# Patient Record
Sex: Female | Born: 1975 | Race: White | Hispanic: No | Marital: Married | Smoking: Current every day smoker
Health system: Southern US, Community
[De-identification: ages and names within clinical notes are randomized; demographics above are authoritative.]

## PROBLEM LIST (undated history)

## (undated) DIAGNOSIS — F1190 Opioid use, unspecified, uncomplicated: Secondary | ICD-10-CM

## (undated) DIAGNOSIS — F988 Other specified behavioral and emotional disorders with onset usually occurring in childhood and adolescence: Secondary | ICD-10-CM

## (undated) DIAGNOSIS — N200 Calculus of kidney: Secondary | ICD-10-CM

## (undated) DIAGNOSIS — K649 Unspecified hemorrhoids: Secondary | ICD-10-CM

## (undated) DIAGNOSIS — F319 Bipolar disorder, unspecified: Secondary | ICD-10-CM

## (undated) HISTORY — DX: Calculus of kidney: N20.0

## (undated) HISTORY — DX: Other specified behavioral and emotional disorders with onset usually occurring in childhood and adolescence: F98.8

## (undated) HISTORY — PX: LITHOTRIPSY: SUR834

## (undated) HISTORY — PX: THERAPEUTIC ABORTION: SHX798

## (undated) HISTORY — DX: Unspecified hemorrhoids: K64.9

---

## 1998-08-18 ENCOUNTER — Inpatient Hospital Stay (HOSPITAL_COMMUNITY): Admission: AD | Admit: 1998-08-18 | Discharge: 1998-08-18 | Payer: Self-pay | Admitting: *Deleted

## 1998-08-18 ENCOUNTER — Encounter: Payer: Self-pay | Admitting: *Deleted

## 1998-08-24 ENCOUNTER — Inpatient Hospital Stay (HOSPITAL_COMMUNITY): Admission: AD | Admit: 1998-08-24 | Discharge: 1998-08-24 | Payer: Self-pay | Admitting: Obstetrics & Gynecology

## 1999-02-09 ENCOUNTER — Emergency Department (HOSPITAL_COMMUNITY): Admission: EM | Admit: 1999-02-09 | Discharge: 1999-02-09 | Payer: Self-pay | Admitting: Emergency Medicine

## 1999-12-24 ENCOUNTER — Emergency Department (HOSPITAL_COMMUNITY): Admission: EM | Admit: 1999-12-24 | Discharge: 1999-12-24 | Payer: Self-pay

## 2000-02-14 ENCOUNTER — Emergency Department (HOSPITAL_COMMUNITY): Admission: EM | Admit: 2000-02-14 | Discharge: 2000-02-14 | Payer: Self-pay | Admitting: Emergency Medicine

## 2000-05-23 ENCOUNTER — Inpatient Hospital Stay (HOSPITAL_COMMUNITY): Admission: AD | Admit: 2000-05-23 | Discharge: 2000-05-23 | Payer: Self-pay | Admitting: Obstetrics

## 2000-06-11 ENCOUNTER — Other Ambulatory Visit: Admission: RE | Admit: 2000-06-11 | Discharge: 2000-06-11 | Payer: Self-pay | Admitting: Obstetrics and Gynecology

## 2000-09-14 ENCOUNTER — Inpatient Hospital Stay (HOSPITAL_COMMUNITY): Admission: AD | Admit: 2000-09-14 | Discharge: 2000-09-14 | Payer: Self-pay | Admitting: Obstetrics & Gynecology

## 2000-10-21 ENCOUNTER — Ambulatory Visit (HOSPITAL_COMMUNITY): Admission: RE | Admit: 2000-10-21 | Discharge: 2000-10-21 | Payer: Self-pay | Admitting: Obstetrics & Gynecology

## 2001-01-28 ENCOUNTER — Inpatient Hospital Stay (HOSPITAL_COMMUNITY): Admission: AD | Admit: 2001-01-28 | Discharge: 2001-02-01 | Payer: Self-pay | Admitting: Obstetrics and Gynecology

## 2001-01-28 ENCOUNTER — Encounter (INDEPENDENT_AMBULATORY_CARE_PROVIDER_SITE_OTHER): Payer: Self-pay

## 2001-04-06 ENCOUNTER — Encounter: Payer: Self-pay | Admitting: Obstetrics and Gynecology

## 2001-04-06 ENCOUNTER — Ambulatory Visit (HOSPITAL_COMMUNITY): Admission: RE | Admit: 2001-04-06 | Discharge: 2001-04-06 | Payer: Self-pay | Admitting: Obstetrics and Gynecology

## 2001-12-02 ENCOUNTER — Other Ambulatory Visit: Admission: RE | Admit: 2001-12-02 | Discharge: 2001-12-02 | Payer: Self-pay | Admitting: Obstetrics and Gynecology

## 2002-05-02 ENCOUNTER — Ambulatory Visit (HOSPITAL_COMMUNITY): Admission: AD | Admit: 2002-05-02 | Discharge: 2002-05-02 | Payer: Self-pay | Admitting: Obstetrics and Gynecology

## 2002-07-21 ENCOUNTER — Inpatient Hospital Stay (HOSPITAL_COMMUNITY): Admission: AD | Admit: 2002-07-21 | Discharge: 2002-07-24 | Payer: Self-pay | Admitting: Obstetrics and Gynecology

## 2002-09-03 ENCOUNTER — Encounter: Payer: Self-pay | Admitting: Emergency Medicine

## 2002-09-03 ENCOUNTER — Emergency Department (HOSPITAL_COMMUNITY): Admission: EM | Admit: 2002-09-03 | Discharge: 2002-09-03 | Payer: Self-pay | Admitting: Emergency Medicine

## 2002-09-05 ENCOUNTER — Ambulatory Visit (HOSPITAL_COMMUNITY): Admission: AD | Admit: 2002-09-05 | Discharge: 2002-09-05 | Payer: Self-pay | Admitting: Urology

## 2002-12-08 ENCOUNTER — Ambulatory Visit (HOSPITAL_BASED_OUTPATIENT_CLINIC_OR_DEPARTMENT_OTHER): Admission: RE | Admit: 2002-12-08 | Discharge: 2002-12-08 | Payer: Self-pay | Admitting: Urology

## 2002-12-14 ENCOUNTER — Other Ambulatory Visit: Admission: RE | Admit: 2002-12-14 | Discharge: 2002-12-14 | Payer: Self-pay | Admitting: Obstetrics and Gynecology

## 2004-01-12 ENCOUNTER — Other Ambulatory Visit: Admission: RE | Admit: 2004-01-12 | Discharge: 2004-01-12 | Payer: Self-pay | Admitting: Obstetrics & Gynecology

## 2004-07-17 ENCOUNTER — Ambulatory Visit: Payer: Self-pay | Admitting: Family Medicine

## 2004-08-27 ENCOUNTER — Ambulatory Visit: Payer: Self-pay | Admitting: Family Medicine

## 2004-09-20 ENCOUNTER — Ambulatory Visit: Payer: Self-pay | Admitting: Internal Medicine

## 2004-12-19 ENCOUNTER — Ambulatory Visit: Payer: Self-pay | Admitting: Family Medicine

## 2005-01-13 ENCOUNTER — Other Ambulatory Visit: Admission: RE | Admit: 2005-01-13 | Discharge: 2005-01-13 | Payer: Self-pay | Admitting: Obstetrics and Gynecology

## 2005-01-30 ENCOUNTER — Ambulatory Visit: Payer: Self-pay | Admitting: Internal Medicine

## 2005-06-05 ENCOUNTER — Ambulatory Visit: Payer: Self-pay | Admitting: Family Medicine

## 2007-04-09 ENCOUNTER — Inpatient Hospital Stay (HOSPITAL_COMMUNITY): Admission: AD | Admit: 2007-04-09 | Discharge: 2007-04-09 | Payer: Self-pay | Admitting: Gynecology

## 2007-08-25 ENCOUNTER — Ambulatory Visit (HOSPITAL_COMMUNITY): Admission: RE | Admit: 2007-08-25 | Discharge: 2007-08-25 | Payer: Self-pay | Admitting: Obstetrics and Gynecology

## 2007-11-16 ENCOUNTER — Inpatient Hospital Stay (HOSPITAL_COMMUNITY): Admission: RE | Admit: 2007-11-16 | Discharge: 2007-11-19 | Payer: Self-pay | Admitting: Obstetrics and Gynecology

## 2008-01-22 ENCOUNTER — Inpatient Hospital Stay (HOSPITAL_COMMUNITY): Admission: AD | Admit: 2008-01-22 | Discharge: 2008-01-22 | Payer: Self-pay | Admitting: Obstetrics and Gynecology

## 2008-05-05 ENCOUNTER — Emergency Department (HOSPITAL_COMMUNITY): Admission: EM | Admit: 2008-05-05 | Discharge: 2008-05-05 | Payer: Self-pay | Admitting: Emergency Medicine

## 2010-04-19 ENCOUNTER — Emergency Department (HOSPITAL_COMMUNITY): Admission: EM | Admit: 2010-04-19 | Discharge: 2010-04-20 | Payer: Self-pay | Admitting: Emergency Medicine

## 2010-06-04 ENCOUNTER — Emergency Department (HOSPITAL_COMMUNITY)
Admission: EM | Admit: 2010-06-04 | Discharge: 2010-06-05 | Payer: Self-pay | Source: Home / Self Care | Admitting: Emergency Medicine

## 2010-06-14 ENCOUNTER — Emergency Department (HOSPITAL_COMMUNITY)
Admission: EM | Admit: 2010-06-14 | Discharge: 2010-06-15 | Payer: Self-pay | Source: Home / Self Care | Admitting: Emergency Medicine

## 2010-06-28 ENCOUNTER — Emergency Department (HOSPITAL_COMMUNITY)
Admission: EM | Admit: 2010-06-28 | Discharge: 2010-06-28 | Payer: Self-pay | Source: Home / Self Care | Admitting: Emergency Medicine

## 2010-09-17 LAB — BASIC METABOLIC PANEL
BUN: 7 mg/dL (ref 6–23)
CO2: 23 mEq/L (ref 19–32)
Calcium: 8.8 mg/dL (ref 8.4–10.5)
Chloride: 110 mEq/L (ref 96–112)
Creatinine, Ser: 0.79 mg/dL (ref 0.4–1.2)
GFR calc Af Amer: >60 mL/min (ref >60)
GFR calc non Af Amer: >60 mL/min (ref >60)
Glucose, Bld: 107 mg/dL — ABNORMAL HIGH (ref 70–99)
Potassium: 3.3 mEq/L — ABNORMAL LOW (ref 3.5–5.1)
Sodium: 139 mEq/L (ref 135–145)

## 2010-09-17 LAB — DIFFERENTIAL
Basophils Absolute: 0 10*3/uL (ref 0.0–0.1)
Basophils Relative: 0 % (ref 0–1)
Eosinophils Absolute: 0.3 10*3/uL (ref 0.0–0.7)
Eosinophils Relative: 5 % (ref 0–5)
Lymphocytes Relative: 26 % (ref 12–46)
Lymphs Abs: 1.6 10*3/uL (ref 0.7–4.0)
Monocytes Absolute: 0.5 10*3/uL (ref 0.1–1.0)
Monocytes Relative: 8 % (ref 3–12)
Neutro Abs: 3.8 10*3/uL (ref 1.7–7.7)
Neutrophils Relative %: 61 % (ref 43–77)

## 2010-09-17 LAB — WET PREP, GENITAL
Trich, Wet Prep: NONE SEEN
Yeast Wet Prep HPF POC: NONE SEEN

## 2010-09-17 LAB — COMPREHENSIVE METABOLIC PANEL
ALT: 28 U/L (ref 0–35)
AST: 22 U/L (ref 0–37)
Albumin: 4.3 g/dL (ref 3.5–5.2)
Alkaline Phosphatase: 68 U/L (ref 39–117)
BUN: 9 mg/dL (ref 6–23)
CO2: 28 mEq/L (ref 19–32)
Calcium: 10.4 mg/dL (ref 8.4–10.5)
Chloride: 102 mEq/L (ref 96–112)
Creatinine, Ser: 0.83 mg/dL (ref 0.4–1.2)
GFR calc Af Amer: >60 mL/min (ref >60)
GFR calc non Af Amer: >60 mL/min (ref >60)
Glucose, Bld: 93 mg/dL (ref 70–99)
Potassium: 3.7 mEq/L (ref 3.5–5.1)
Sodium: 138 mEq/L (ref 135–145)
Total Bilirubin: 0.5 mg/dL (ref 0.3–1.2)
Total Protein: 7.6 g/dL (ref 6.0–8.3)

## 2010-09-17 LAB — URINALYSIS, ROUTINE W REFLEX MICROSCOPIC
Bilirubin Urine: NEGATIVE
Bilirubin Urine: NEGATIVE
Glucose, UA: NEGATIVE mg/dL
Glucose, UA: NEGATIVE mg/dL
Hgb urine dipstick: NEGATIVE
Ketones, ur: 15 mg/dL — AB
Ketones, ur: NEGATIVE mg/dL
Leukocytes, UA: NEGATIVE
Nitrite: NEGATIVE
Nitrite: POSITIVE — AB
Protein, ur: NEGATIVE mg/dL
Protein, ur: NEGATIVE mg/dL
Specific Gravity, Urine: 1.019 (ref 1.005–1.030)
Specific Gravity, Urine: 1.031 — ABNORMAL HIGH (ref 1.005–1.030)
Urobilinogen, UA: 1 mg/dL (ref 0.0–1.0)
Urobilinogen, UA: 1 mg/dL (ref 0.0–1.0)
pH: 6 (ref 5.0–8.0)
pH: 7 (ref 5.0–8.0)

## 2010-09-17 LAB — CBC
HCT: 40.5 % (ref 36.0–46.0)
HCT: 43.2 % (ref 36.0–46.0)
Hemoglobin: 13.8 g/dL (ref 12.0–15.0)
Hemoglobin: 14.8 g/dL (ref 12.0–15.0)
MCH: 32.5 pg (ref 26.0–34.0)
MCH: 32.9 pg (ref 26.0–34.0)
MCHC: 34.1 g/dL (ref 30.0–36.0)
MCHC: 34.3 g/dL (ref 30.0–36.0)
MCV: 95.5 fL (ref 78.0–100.0)
MCV: 96 fL (ref 78.0–100.0)
Platelets: 232 10*3/uL (ref 150–400)
Platelets: 265 10*3/uL (ref 150–400)
RBC: 4.24 MIL/uL (ref 3.87–5.11)
RBC: 4.5 MIL/uL (ref 3.87–5.11)
RDW: 12.6 % (ref 11.5–15.5)
RDW: 12.7 % (ref 11.5–15.5)
WBC: 6.2 10*3/uL (ref 4.0–10.5)
WBC: 8 10*3/uL (ref 4.0–10.5)

## 2010-09-17 LAB — URINE MICROSCOPIC-ADD ON

## 2010-09-17 LAB — PREGNANCY, URINE: Preg Test, Ur: NEGATIVE

## 2010-09-17 LAB — GC/CHLAMYDIA PROBE AMP, GENITAL
Chlamydia, DNA Probe: NEGATIVE
GC Probe Amp, Genital: NEGATIVE

## 2010-09-17 LAB — POCT PREGNANCY, URINE: Preg Test, Ur: NEGATIVE

## 2010-09-18 LAB — DIFFERENTIAL
Basophils Absolute: 0.1 10*3/uL (ref 0.0–0.1)
Basophils Relative: 1 % (ref 0–1)
Eosinophils Absolute: 0.1 10*3/uL (ref 0.0–0.7)
Eosinophils Relative: 1 % (ref 0–5)
Lymphocytes Relative: 22 % (ref 12–46)
Lymphs Abs: 1.6 10*3/uL (ref 0.7–4.0)
Monocytes Absolute: 0.6 10*3/uL (ref 0.1–1.0)
Monocytes Relative: 8 % (ref 3–12)
Neutro Abs: 4.8 10*3/uL (ref 1.7–7.7)
Neutrophils Relative %: 68 % (ref 43–77)

## 2010-09-18 LAB — BASIC METABOLIC PANEL
BUN: 12 mg/dL (ref 6–23)
CO2: 23 mEq/L (ref 19–32)
Calcium: 9.5 mg/dL (ref 8.4–10.5)
Chloride: 108 mEq/L (ref 96–112)
Creatinine, Ser: 0.75 mg/dL (ref 0.4–1.2)
GFR calc Af Amer: >60 mL/min (ref >60)
GFR calc non Af Amer: >60 mL/min (ref >60)
Glucose, Bld: 118 mg/dL — ABNORMAL HIGH (ref 70–99)
Potassium: 3.4 mEq/L — ABNORMAL LOW (ref 3.5–5.1)
Sodium: 138 mEq/L (ref 135–145)

## 2010-09-18 LAB — RAPID URINE DRUG SCREEN, HOSP PERFORMED
Amphetamines: POSITIVE — AB
Barbiturates: NOT DETECTED
Benzodiazepines: POSITIVE — AB
Cocaine: POSITIVE — AB
Opiates: POSITIVE — AB
Tetrahydrocannabinol: POSITIVE — AB

## 2010-09-18 LAB — CBC
HCT: 41.8 % (ref 36.0–46.0)
Hemoglobin: 14.5 g/dL (ref 12.0–15.0)
MCH: 33.7 pg (ref 26.0–34.0)
MCHC: 34.7 g/dL (ref 30.0–36.0)
MCV: 97 fL (ref 78.0–100.0)
Platelets: 241 10*3/uL (ref 150–400)
RBC: 4.3 MIL/uL (ref 3.87–5.11)
RDW: 13.1 % (ref 11.5–15.5)
WBC: 7.1 10*3/uL (ref 4.0–10.5)

## 2010-09-18 LAB — HEPATIC FUNCTION PANEL
ALT: 15 U/L (ref 0–35)
AST: 15 U/L (ref 0–37)
Albumin: 4.3 g/dL (ref 3.5–5.2)
Alkaline Phosphatase: 58 U/L (ref 39–117)
Bilirubin, Direct: 0.1 mg/dL (ref 0.0–0.3)
Indirect Bilirubin: 0.4 mg/dL (ref 0.3–0.9)
Total Bilirubin: 0.5 mg/dL (ref 0.3–1.2)
Total Protein: 7.4 g/dL (ref 6.0–8.3)

## 2010-09-18 LAB — ETHANOL: Alcohol, Ethyl (B): <5 mg/dL (ref 0–10)

## 2010-09-18 LAB — POCT PREGNANCY, URINE: Preg Test, Ur: NEGATIVE

## 2010-09-18 LAB — TRICYCLICS SCREEN, URINE: TCA Scrn: NOT DETECTED

## 2010-09-18 LAB — ACETAMINOPHEN LEVEL: Acetaminophen (Tylenol), Serum: <10 ug/mL — ABNORMAL LOW (ref 10–30)

## 2010-11-19 NOTE — Op Note (Signed)
NAMEMERARI, PION             ACCOUNT NO.:  [id number]   MEDICAL RECORD NO.:  [id number]          PATIENT TYPE:  INP   LOCATION:  9111                          FACILITY:  [facility]   PHYSICIAN:  Randye Lobo, M.D.   DATE OF BIRTH:  [birth year]   DATE OF PROCEDURE:  11/16/2007  DATE OF DISCHARGE:                               OPERATIVE REPORT   PREOPERATIVE DIAGNOSES:  1. Intrauterine gestation at 79 plus 1 weeks.  2. History of prior cesarean section x2, desires repeat cesarean      section.   POSTOPERATIVE DIAGNOSES:  1. Intrauterine gestation at 75 plus 1 weeks.  2. History of prior cesarean section x2, desires repeat cesarean      section.   PROCEDURE:  Repeat low-segment transverse cesarean section.   SURGEON:  Randye Lobo, MD   ASSISTANT:  Gretchen Short, PA-C   ANESTHESIA:  Spinal.   IV FLUIDS:  2400 mL Ringer's lactate.   ESTIMATED BLOOD LOSS:  500 mL.   URINE OUTPUT:  100 mL.   COMPLICATIONS:  None.   INDICATIONS FOR PROCEDURE:  The patient is a 35 year old, para 2,  Caucasian female at 67 plus 1 weeks' gestation, who has a history of  prior cesarean section x2, and who desires a repeat cesarean section.  The patient wishes to proceed after risks, benefits, and alternatives  are reviewed.   FINDINGS:  A viable female was delivered at 11:06 a.m. with Apgars of 9 at  one minute and 9 at five minutes.  The weight was 7 pounds 11 ounces.  The amniotic fluid was clear.  The placenta had a normal insertion of a  three-vessel cord.  There were minimal right para tubal adhesions, which  were lysed with monopolar cautery.  The ovaries, left fallopian tube,  and uterus were unremarkable.   SPECIMENS:  None.   PROCEDURE:  The patient was re-identified in the preoperative hold area.  She received clindamycin 900 mg IV for antibiotic prophylaxis.  In the  operating room, a spinal anesthetic was administered and the patient was  then placed in the supine position.  The  abdomen was sterilely prepped,  and a Foley catheter placed inside the bladder.  She was sterilely  draped.   A Pfannenstiel incision was created sharply with a scalpel and the  incision was carried down to the fascia using the scalpel.  Hemostasis  was created with monopolar cautery.  The fascia was incised in the  midline and the fascial incision was extended bilaterally with the Mayo  scissors.  The rectus muscles were separated from the fascia superiorly  and inferiorly using sharp dissection.  The parietal peritoneum was  elevated with two hemostat clamps and was entered sharply.  The  peritoneal incision was extended cranially and caudally.   The bladder retractor was placed over the bladder and the lower uterine  segment was exposed.  A bladder flap was then sharply created.  A  transverse lower uterine segment incision was created sharply with a  scalpel and the incision was extended bilaterally in an upward fashion  using a bandage  scissors.   A hand was inserted through the uterine incision and the vertex was  delivered without difficulty followed by the remainder of the infant.  The nares and mouth were suctioned.  The umbilical cord was doubly  clamped and cut.  The newborn was carried over to the awaiting  pediatricians in vigorous condition.   The placenta was then manually extracted and was set aside for cord  blood donation and cord blood collection.   The uterus was exteriorized at this time and it was wiped clean with a  moistened lap pad.  The uterine incision was then closed with a double  layer closure of #1 chromic.  The first with a running locked layer and  the second with an imbricating layer.  There was a small amount of  oozing along the right mid portion of the incision and this responded to  a figure-of-eight suture of #1 chromic.  There was some oozing along the  peritoneal edges of the visceral peritoneum of the uterus, and this  responded to  monopolar cautery.   The uterus was returned to the peritoneal cavity, which was irrigated  and suctioned.  The uterine incision was hemostatic, and the abdomen was  therefore closed.  The parietal peritoneum was closed with a running  suture of 3-0 Vicryl.  The rectus muscles were reapproximated using  interrupted sutures of #1 chromic.  The fascial incision was closed with  a running suture of 0 Vicryl.  The subcutaneous layer was irrigated and  suctioned and made hemostatic with monopolar cautery.  There were some  small oozing vessels in the subcutaneous layer that did respond to  pressure and monopolar cautery.  A decision was made to close this layer  also with interrupted sutures of #2 plain gut suture.  Hemostasis was  good as the skin was closed with staples.  A sterile pressure bandage  was placed over this.   This concluded the patient's procedure.  There were no complications.  All needle, instrument, and sponge counts were correct.  The patient is  escorted to the recovery room in stable and awake condition.      Randye Lobo, M.D.  Electronically Signed     BES/MEDQ  D:  11/16/2007  T:  11/17/2007  Job:  [id number]

## 2010-11-22 NOTE — Discharge Summary (Signed)
[facility]  Patient:    PETINA, MURASKI Visit Number: [id number] MRN: [id number]          Service Type: OBS Location: 910A 9108 [id number] Attending Physician:  Melony Overly Dictated by:   Leilani Able, P.A. Admit Date:  01/28/2001 Discharge Date: 02/01/2001                             Discharge Summary  FINAL DIAGNOSES:              Intrauterine pregnancy at 41 3/[redacted] weeks gestation, failure to progress, chorioamnionitis, uterine atone.  PROCEDURE:                    Primary low segment transverse cesarean section.  SURGEON:                      Brook A. Edward Jolly, M.D.  COMPLICATIONS:                None.  HISTORY:                      This 35 year old G3, P0-0-2-0 presents at 18 2/[redacted] weeks gestation in early labor.  Patients cervix is about 4 cm dilated, 80% effaced, about a -2 station on admission.  AROM was performed.  Pitocin augmentation was begun and IUPCs and fetal scalp electrode were placed. Patient dilated to complete and complete.  As patient started pushing she did develop temperatures of about 100.3 and chorioamnionitis was diagnosed.  She was treated with clindamycin 900 g IV and Tylenol.  She pushed for over two hours and no descent of the vertex below a 0 station.  The heart rate tracing increased to about 170-175 baseline with accelerations up to about 200.  At this time a discussion was held with the patient regarding the failure to progress and the chorioamnionitis.  A decision was made to proceed with a cesarean section.  Patient was taken to the operating room by Dr. Conley Simmonds where a primary low transverse cesarean section was performed with the delivery of a 9 pound 4 ounce female infant with Apgars of 8 and 9.  The delivery went without complications.  Uterine atone was noted and therefore after delivery the patient received Pitocin 40 units IV and Methergine 0.2 mg IM which produced good uterine tone.  Patients  postoperative course was benign without significant fevers.  She just had mild lochia and her uterus continued to be firm.  She was felt ready for discharge on postoperative day #3.  Was sent home on a regular diet.  Told to decrease activities.  Told to continue prenatal vitamins.  Was given a prescription for Tylox #25 one to two q.4h. p.r.n. for pain.  Told she could use over-the-counter pain medications. Follow-up in the office in four weeks. Dictated by:   Leilani Able, P.A. Attending Physician:  Melony Overly DD:  03/15/01 TD:  03/15/01 Job: [id number]

## 2010-11-22 NOTE — Op Note (Signed)
NAME:  Misty Young, Misty Young                       ACCOUNT NO.:  [id number]   MEDICAL RECORD NO.:  [id number]                   PATIENT TYPE:  AMB   LOCATION:  DAY                                  FACILITY:  [facility]   PHYSICIAN:  Maretta Bees. Vonita Moss, M.D.             DATE OF BIRTH:  10/23/1975   DATE OF PROCEDURE:  09/05/2002  DATE OF DISCHARGE:                                 OPERATIVE REPORT   PREOPERATIVE DIAGNOSIS:  Left ureteral calculus.   POSTOPERATIVE DIAGNOSIS:  Left ureteral calculus.   PROCEDURES:  1. Cystoscopy.  2. Left retrograde pyelography.  3. Left ureteroscopy with laser lithotripsy.  4. Left ureteral stent placement.   SURGEON:  Maretta Bees. Vonita Moss, M.D.   ASSISTANT:  Crecencio Mc, M.D.   ANESTHESIA:  General.   COMPLICATIONS:  None.   INDICATIONS:  The patient is a 35 year old white female who is approximately  6 weeks post partum. She began having  left-sided flank pain and a CT scan  was performed which demonstrated a 5-mm distal left ureteral calculus. The  patient had been in significant pain, and after discussing the options, the  patient elected to proceed with ureteroscopic removal with possible need for  laser lithotripsy. The potential risks and benefits of the procedure were  explained to the patient and she consented.   DESCRIPTION OF PROCEDURE:  The patient was taken to the operating room and a  general anesthetic was administered. The patient was given preoperative  antibiotics, placed in the dorsal lithotomy position and prepped and draped  in the usual sterile fashion.   Next cystoscopy was performed with the 12 degree lens. The ureteral orifices  were noted to be in the normal anatomic position and unremarkable otherwise.  The bladder demonstrated no evidence of any bladder tumors, stones, or other  new mucosal pathology.   Attention was then turned to the left ureter. An attempt was made to pass a  0.38 guide wire past the stone. However,  resistance was met and therefore it  was removed and a 0.038 glide wire was passed by the stone and into the  renal pelvis under fluoroscopic guidance.   A 6 French end-hole catheter was then passed up over this wire into the  renal pelvis and the glide wire was exchanged for the guide wire.  Ureteroscopy was then attempted next to the guide wire. However, the ureter  was of insufficient caliber to allow ureteroscopy. Therefore, the  ureteroscope was removed and the wire was back loaded over the wire and  balloon dilation was performed of the distal ureter.   Again ureteroscopy was attempted and the distal ureter was able to be  intubated. However, the area just above the area of dilation and below the  stone still could not be maneuvered to allow passage of the ureteroscope up  to the stone.   Therefore an attempt was made to redilate the area just above  this narrowed  portion. However, the balloon dilator could not be passed up by this  narrowed point. Therefore, the ureteral dilators were used to gently dilate  up this narrowed area.   The ureteroscope was then placed back into the bladder and the left ureter  was able to  be intubated and the stone was able to  be visualized. On  visual inspection, the stone appeared to be in 2 halves with a connecting  isthmus. It was decided that laser lithotripsy would be necessary.   Therefore the homium laser was passed up through the ureteroscope  and the  stone was broken in half. A Nitinol basket was then used to grasp the  remaining fragments which were able to be extracted from the ureter.  Reinspection of the ureter revealed no further fragments..   A multicoil stent was then placed up over the wire into the renal pelvis  under fluoroscopic guidance. The wire was then removed with a good curl  noted in the pelvis as well as in the bladder. A string was left on the  distal end of the stent.   The bladder was then emptied and the  procedure was ended. There were no  complications and the patient appeared to tolerate the procedure well. She  was able to be transferred to the recovery unit in satisfactory condition.  Please note that Dr. Maretta Bees. Vonita Moss was present and participated in this  entire procedure.     Crecencio Mc, M.D.                          Maretta Bees. Vonita Moss, M.D.    LB/MEDQ  D:  09/05/2002  T:  09/05/2002  Job:  [id number]

## 2010-11-22 NOTE — Discharge Summary (Signed)
Misty Young, Misty Young             ACCOUNT NO.:  [id number]   MEDICAL RECORD NO.:  [id number]          PATIENT TYPE:  INP   LOCATION:  9111                          FACILITY:  [facility]   PHYSICIAN:  Carrington Clamp, M.D. DATE OF BIRTH:  [birth year]   DATE OF ADMISSION:  11/16/2007  DATE OF DISCHARGE:  11/19/2007                               DISCHARGE SUMMARY   FINAL DIAGNOSES:  1. Intrauterine gestation at 39-1/[redacted] weeks gestation.  2. History of prior cesarean sections x2.  The patient desires repeat      cesarean section.   PROCEDURE:  Repeat low transverse cesarean section.   SURGEON:  Randye Lobo, MD   ASSISTANT:  Chinita Greenland, PA-C   COMPLICATIONS:  None.   This 35 year old G5, P 2-0-2-2 presents at term for a repeat cesarean  section.  The patient had two prior cesarean sections and desires repeat  with this pregnancy as well.  The patient's antepartum course up to this  point has been uncomplicated.  She did have a negative group B strep  culture obtained at about 35 weeks' gestation.  The rubella status is  equivocal.  She will need to get a vaccine postpartum.  The patient is  also Rh negative and did receive RhoGAM at 27 weeks.  The patient was  taken to the operating room on Nov 16, 2007, by Dr. Conley Simmonds where  repeat low segment transverse cesarean section was performed with the  delivery of a 7 pounds 11 ounces female infant with Apgars of 9 and 9.  There was some minimal right paratubal adhesions that were lysed.  The  delivery and procedure went without complication.  The patient's  postoperative course was benign without any significant fevers.  She was  felt ready for discharge on postoperative day #2.  She was sent home on  a regular diet, told to decrease activities, told to continue her  vitamins, was given Percocet 1 to 2 every 4-6 hours as needed for pain,  told she could also use Motrin up to 600 mg every 6 hours as needed for  pain.  She did want her  little boy circumcised prior to discharge and  did receive RhoGAM per protocol.   LABS ON DISCHARGE:  The patient had a hemoglobin of 11.4, white blood  cell count of 10.7 and platelets of 141,000.      Leilani Able, P.A.-C.      Carrington Clamp, M.D.  Electronically Signed   MB/MEDQ  D:  12/15/2007  T:  12/16/2007  Job:  [id number]

## 2010-11-22 NOTE — Op Note (Signed)
NAME:  Misty Young, Misty Young                       ACCOUNT NO.:  [id number]   MEDICAL RECORD NO.:  [id number]                   PATIENT TYPE:  INP   LOCATION:  9123                                 FACILITY:  [facility]   PHYSICIAN:  Randye Lobo, M.D.                DATE OF BIRTH:  07/08/1975   DATE OF PROCEDURE:  07/21/2002  DATE OF DISCHARGE:                                 OPERATIVE REPORT   PREOPERATIVE DIAGNOSES:  1. Intrauterine gestation at 38+5 weeks.  2. History of prior cesarean section, desire for repeat cesarean section.   POSTOPERATIVE DIAGNOSES:  1. Intrauterine gestation at 38+5 weeks.  2. History of prior cesarean section, desire for repeat cesarean section.   PROCEDURE:  Repeat low segment transverse cesarean section.   ANESTHESIA:  Spinal.   IV FLUIDS:  3000 cubic centimeters Ringer's lactate.   ESTIMATED BLOOD LOSS:  700 cubic centimeters.   URINE OUTPUT:  120 cubic centimeters.   COMPLICATIONS:  None.   INDICATIONS FOR PROCEDURE:  The patient is a 35 year old gravida 4, para 1-0-  2-1 Caucasian female at 38+[redacted] weeks gestation who throughout her pregnancy  had requested a repeat cesarean section.  A plan was made to proceed with  surgical delivery after the risks, benefits, and alternatives were discussed  with her.   FINDINGS:  A viable female was delivered at 9:39 a.m. with Apgars of 9 at  one minute and 9 at five minutes.  The weight was later noted to be 8 pounds  4 ounces.  The amniotic fluid was clear.  The uterus, tubes, and ovaries  were normal.   SPECIMENS:  None.   PROCEDURE:  With an IV in place the patient was taken to the operating room  after she was properly identified.  The patient received a spinal anesthetic  and was then placed in the supine position with a left lateral tilt.  The  patient's abdomen was sterilely prepped and a Foley catheter was placed  inside the bladder.  The patient was then sterilely draped.  A Pfannenstiel  incision  was created sharply along the line of the patient's previous  Pfannenstiel incision.  This was carried down to the fascia with a  combination of monopolar cautery and sharp dissection.  The fascia was then  scored in the midline with the monopolar cautery instrument and the incision  was extended bilaterally using the Mayo scissors.  The rectus fascia was  separated from the underlying rectus muscles using a combination of sharp  dissection and monopolar cautery.  The rectus muscles were then divided in  the midline sharply.  The parietoperitoneum was grasped with two hemostat  clamps and was entered sharply with the Metzenbaum scissors.  The incision  was then extended cranially and caudally.   The lower uterine segment was exposed after the bladder retractor was  placed.  There were some adhesions along the lower uterine segment  between  the bladder and the lower uterine segment and because of this a high lower  uterine segment transverse incision was created sharply with a scalpel.  The  incision was extended bluntly and the membranes were ruptured.  A hand was  then inserted through the uterine incision and the fetal vertex was  delivered without difficulty.  The nares and mouth were suctioned and the  remainder of the infant was delivered.  The cord was doubly clamped and cut  and the newborn was carried over to the awaiting pediatricians in vigorous  condition.  Cord blood was collected and the placenta was then manually  extracted.  The uterus was exteriorized for its closure.  A moistened lap  pad was used to remove any remaining membranes from within the uterine  cavity.  The uterine incision was closed with a running locked suture of  number 1 chromic.  Hemostasis was noted to be excellent.  The uterus was  returned to the peritoneal cavity which was suctioned of any remaining  blood.   The parietoperitoneum was closed at this time with a running suture of 3-0  plain.  The  rectus muscles were reapproximated with two figure-of-eight  sutures of number 1 chromic.  The fascia was next closed with a running  suture of 0 Vicryl.  The subcutaneous tissue was irrigated and suctioned and  made hemostatic with monopolar cautery.  The skin was closed with staples  and a sterile bandage was placed over the incision.   This concluded the patient's procedure.  There were no complications.  All  needle, instrument, and sponge counts were correct.   The patient was escorted to the recovery room in stable and awake condition.                                               Randye Lobo, M.D.    BES/MEDQ  D:  07/21/2002  T:  07/21/2002  Job:  [id number]

## 2010-11-22 NOTE — Op Note (Signed)
[facility]  Patient:    Misty Young, Misty Young                  MRN: [id number] Proc. Date: 01/29/01 Adm. Date:  [id number] Attending:  Conley Simmonds A                           Operative Report  PREOPERATIVE DIAGNOSES:       1. Intrauterine pregnancy at 41 plus [redacted] weeks                                  gestation.                               2. Failure to progress.                               3. Chorioamnionitis.  POSTOPERATIVE DIAGNOSES:      1. Intrauterine pregnancy at 41 plus [redacted] weeks                                  gestation.                               2. Failure to progress.                               3. Chorioamnionitis.                               4. Uterine atony.  OPERATION:                    Primary low segment transverse cesarean section.  SURGEON:                      Brook A. Edward Jolly, M.D.  ANESTHESIA:                   Epidural.  IV FLUIDS:                    2300 cc of Ringers lactate.  ESTIMATED BLOOD LOSS:         1000 cc.  URINE OUTPUT:                 500 cc.  COMPLICATIONS:                None.  INDICATIONS FOR PROCEDURE:    The patient is a 35 year old gravida 3, para 0-0-2-0, who presented at 33 plus [redacted] weeks gestation in early labor.  The patients cervix was noted to be 4 cm dilated, 80% effaced, and with the vertex at the -1 to -2 station.  Artificial rupture of membranes was performed at this time and the fluid was noted to be clear with blood tingeing.  The fetal heart rate tracing was reassuring at this time, and the patient was having contractions every three minutes.  The patient was admitted in labor. She did receive Stadol intravenously for pain medication.  The  patient then went on to receive an epidural.  The patient did require Pitocin augmentation of her labor, and an IUPC and a fetal scalp electrode were also placed for the assistance of monitoring.  The patient had adequate uterine contractions,  at which time, she began to change her cervix well.  The patient achieved complete cervical dilatation at approximately 3:15 a.m.  The vertex was at the 0 station with the caput at the +1 station.  At this time, the fetal scalp electrode was replaced, and the fetal heart rate tracing was noted to be reassuring.  The patient was noted to have a temperature to 100.3 degrees Fahrenheit as she began pushing, and chorioamnionitis was diagnosed.  She was treated with clindamycin 33 mg intravenously and Tylenol.  The patient went on to push for over two hours with no descent of the vertex below the 0 station between pushes.  The fetal heart tracing baseline increased to 170 to 175 with accelerations up to 200.  There were occasional late decelerations.  At this time, a discussion was held with the patient and her family regarding the failure to progress of her labor, and the chorioamnionitis.  The recommendation was made to proceed with a primary low segment transverse cesarean section.  The family and patient chose to proceed after the risks and benefits were reviewed with them.  All questions were answered.  FINDINGS:                     A viable female infant was delivered at 5:30 a.m. with Apgars of 8 at one minute and 9 at five minutes.  The infant was noted to ________.  The amniotic fluid was noted.  The newborn weight was 9 pounds and 4 ounces.  The placenta was manually extracted, and it was sent to pathology.  Uterine atony was appreciated.  After delivery, the patient received Pitocin 40 units intravenously and Methergine 0.2 mg IM which produced good uterine tone.  There was a subserosal hematoma which was superior to the uterine incision.  This was isolated with figure-of-eight sutures of #1 chromic which isolated the hematoma and made it non-expanding. the cord pH was noted to be 7.28.  The fallopian tubes and ovaries were normal.  SPECIMENS:                    Placenta was  sent to pathology.  DESCRIPTION OF PROCEDURE:     With an IV, epidural catheter, and Foley in place, the patient was taken to the operating room from her labor and delivery suite.  The patients epidural was dosed after she was placed in the supine position on the operating room table. 33  The patients abdomen was then sterilely prepped and draped.  After adequate anesthesia was achieved, a Pfannenstiel incision was created sharply with the scalpel.  This was carried down to the rectus fascia using the same.  The fascia was then scored in the midline.  With the scalpel, the incision was extended bilaterally using Mayo scissors.  The rectus muscles were dissected off of the underlying fascia using the Mayo scissors.  The parietal peritoneum was entered sharply with Metzenbaum scissors after it was grasped and elevated with two hemostat clamps.  The peritoneal incision was extended cranially an caudally with care taken to avoid cystotomy.  The bladder retractor was placed over the bladder, and the lower uterine segment was exposed.  A bladder flap was created sharply with the Metzenbaum scissors.  The bladder retractor was replaced over the bladder to expose the lower uterine segment, and a transverse incision was created sharply with the scalpel.  This was carried out bilaterally in an upward fashion using the bandaged scissors.  The hand was inserted through the uterine incision, and the vertex was delivered without difficulty.  The remainder of the infant was delivered, and the nares and mouth were suctioned.  The cord was doubly clamped and cut, and the newborn was carried over to the awaiting pediatricians.  The cord pH and cord blood was obtained at this time.  The placenta was manually extracted, and the uterus was exteriorized for its closure.  A moistened lap pad was used to remove any remaining membranes from within the uterine cavity, and there were no products of conception noted.   The uterine incision was closed with a running locked suture of #1 chromic.  The hematoma along the subserosal region just superior to the incision in the midline was  appreciated.  A figure-of-eight suture of #1 chromic was placed which made the hematoma non-expanding.  There was some additional bleeding along the midline portion of the incision.  This responded well to a figure-of-eight suture of #1 chromic.  The uterus was returned to the peritoneal cavity which was then copiously irrigated and suctioned with crystalloid solution.  The incision was reexamined, and edges of the myometrium in the midline were cauterized with monopolar cautery for excellent hemostasis.  The rectus muscles were next approximated in the midline by placing a figure-of-eight suture of #1 chromic. The rectus fascia was examined and noted to be free of hematomas.  It was therefore closed with a running suture of 0 Vicryl.  The subcutaneous tissue was irrigated and suctioned.  Small bleeding vessels were cauterized for excellent hemostasis.  The subcutaneous tissue was closed with interrupted sutures of 3-0 plain, and staples were placed on the skin.  A pressure dressing was placed over the skin incisions.  The uterus was expressed of any remaining clots.  The patient was then escorted to the recovery room in stable and awake condition.  There were no complications to the procedure.  All needle, instrument, and sponge counts were correct. DD:  01/29/01 TD:  01/29/01 Job: [id number]

## 2010-11-22 NOTE — Discharge Summary (Signed)
   NAMEJENNELL, Misty Young                       ACCOUNT NO.:  [id number]   MEDICAL RECORD NO.:  [id number]                   PATIENT TYPE:  INP   LOCATION:  9123                                 FACILITY:  [facility]   PHYSICIAN:  Gerrit Friends. Aldona Bar, M.D.                DATE OF BIRTH:  1975/10/08   DATE OF ADMISSION:  07/21/2002  DATE OF DISCHARGE:  07/24/2002                                 DISCHARGE SUMMARY   DISCHARGE DIAGNOSES:  1. Term pregnancy delivered, 8 pound 4 ounce female with Apgars 9/9.  2. Blood type O negative--RhoGAM given.  3. Previous cesarean section.   PROCEDURES:  Repeat low transverse cesarean section.   HOSPITAL SUMMARY:  This 35 year old gravida 4, para 1 who had a previous  cesarean section was admitted at term for delivery by repeat cesarean  section which was carried out on the day of admission with delivery of an 8  pound 4 ounce female with Apgars of 9/9.  Her postoperative course was  relatively uncomplicated.  Her discharge hemoglobin 9.4, white count 8800,  platelet count 175,000.  On the morning of January 18th, she was ambulating  well, tolerating a regular diet well, having normal bowel and bladder  function, was afebrile.  Her breast feeding was going well.  Her wound was  clean and dry and she was ready for discharge.  Accordingly, her staples  were removed and her wound was Steri-Striped with benzoin.  She was given  all appropriate instructions and understood all instructions well.   DISCHARGE MEDICATIONS:  1. Vitamins--one a day.  2. Ferrous sulfate 300 mg daily.  3. Motrin 600 mg every 6 hours as needed for pain.  4. Tylox one to two every 4-6 hours as needed for severe pain.   FOLLOW UP:  She will return to the office for followup in approximately 4  weeks' time or as needed.   CONDITION ON DISCHARGE:  Improved.                                               Gerrit Friends. Aldona Bar, M.D.    RMW/MEDQ  D:  07/24/2002  T:  07/24/2002  Job:  [id number]

## 2011-01-16 ENCOUNTER — Emergency Department (HOSPITAL_COMMUNITY)
Admission: EM | Admit: 2011-01-16 | Discharge: 2011-01-16 | Disposition: A | Discharge status: Home / Self Care | Payer: Medicaid Other | Attending: Emergency Medicine | Admitting: Emergency Medicine

## 2011-01-16 DIAGNOSIS — R112 Nausea with vomiting, unspecified: Secondary | ICD-10-CM | POA: Insufficient documentation

## 2011-01-16 DIAGNOSIS — O99891 Other specified diseases and conditions complicating pregnancy: Secondary | ICD-10-CM | POA: Insufficient documentation

## 2011-01-16 LAB — COMPREHENSIVE METABOLIC PANEL
ALT: 59 U/L — ABNORMAL HIGH (ref 0–35)
AST: 42 U/L — ABNORMAL HIGH (ref 0–37)
Albumin: 3.5 g/dL (ref 3.5–5.2)
Alkaline Phosphatase: 70 U/L (ref 39–117)
BUN: 5 mg/dL — ABNORMAL LOW (ref 6–23)
CO2: 24 mEq/L (ref 19–32)
Calcium: 8.8 mg/dL (ref 8.4–10.5)
Chloride: 104 mEq/L (ref 96–112)
Creatinine, Ser: 0.6 mg/dL (ref 0.50–1.10)
GFR calc Af Amer: >60 mL/min (ref >60)
GFR calc non Af Amer: >60 mL/min (ref >60)
Glucose, Bld: 105 mg/dL — ABNORMAL HIGH (ref 70–99)
Potassium: 3.7 mEq/L (ref 3.5–5.1)
Sodium: 136 mEq/L (ref 135–145)
Total Bilirubin: 0.2 mg/dL — ABNORMAL LOW (ref 0.3–1.2)
Total Protein: 7 g/dL (ref 6.0–8.3)

## 2011-01-16 LAB — DIFFERENTIAL
Basophils Absolute: 0 10*3/uL (ref 0.0–0.1)
Basophils Relative: 0 % (ref 0–1)
Eosinophils Absolute: 0.1 10*3/uL (ref 0.0–0.7)
Eosinophils Relative: 2 % (ref 0–5)
Lymphocytes Relative: 14 % (ref 12–46)
Lymphs Abs: 0.7 10*3/uL (ref 0.7–4.0)
Monocytes Absolute: 0.3 10*3/uL (ref 0.1–1.0)
Monocytes Relative: 6 % (ref 3–12)
Neutro Abs: 4.2 10*3/uL (ref 1.7–7.7)
Neutrophils Relative %: 77 % (ref 43–77)

## 2011-01-16 LAB — CBC
HCT: 35.6 % — ABNORMAL LOW (ref 36.0–46.0)
Hemoglobin: 12.3 g/dL (ref 12.0–15.0)
MCH: 31.7 pg (ref 26.0–34.0)
MCHC: 34.6 g/dL (ref 30.0–36.0)
MCV: 91.8 fL (ref 78.0–100.0)
Platelets: 201 10*3/uL (ref 150–400)
RBC: 3.88 MIL/uL (ref 3.87–5.11)
RDW: 13.6 % (ref 11.5–15.5)
WBC: 5.4 10*3/uL (ref 4.0–10.5)

## 2011-01-16 LAB — URINALYSIS, ROUTINE W REFLEX MICROSCOPIC
Bilirubin Urine: NEGATIVE
Glucose, UA: NEGATIVE mg/dL
Hgb urine dipstick: NEGATIVE
Ketones, ur: NEGATIVE mg/dL
Leukocytes, UA: NEGATIVE
Nitrite: NEGATIVE
Protein, ur: NEGATIVE mg/dL
Specific Gravity, Urine: 1.012 (ref 1.005–1.030)
Urobilinogen, UA: 1 mg/dL (ref 0.0–1.0)
pH: 8 (ref 5.0–8.0)

## 2011-01-17 LAB — HIV ANTIBODY (ROUTINE TESTING W REFLEX): HIV: NONREACTIVE

## 2011-01-17 LAB — ABO/RH: RH Type: NEGATIVE

## 2011-01-17 LAB — ANTIBODY SCREEN: Antibody Screen: NEGATIVE

## 2011-01-17 LAB — HEPATITIS B SURFACE ANTIGEN: Hepatitis B Surface Ag: NEGATIVE

## 2011-01-17 LAB — RUBELLA ANTIBODY, IGM: Rubella: UNDETERMINED

## 2011-01-17 LAB — RPR: RPR: NONREACTIVE

## 2011-01-22 LAB — CULTURE, BLOOD (ROUTINE X 2)
Culture  Setup Time: 201207121357
Culture: NO GROWTH

## 2011-01-27 ENCOUNTER — Encounter (INDEPENDENT_AMBULATORY_CARE_PROVIDER_SITE_OTHER): Payer: Self-pay | Admitting: General Surgery

## 2011-02-03 ENCOUNTER — Ambulatory Visit (INDEPENDENT_AMBULATORY_CARE_PROVIDER_SITE_OTHER): Payer: Self-pay | Admitting: General Surgery

## 2011-03-28 LAB — RH IMMUNE GLOBULIN WORKUP (NOT WOMEN'S HOSP)
ABO/RH(D): O NEG
Antibody Screen: NEGATIVE

## 2011-04-02 LAB — URINALYSIS, ROUTINE W REFLEX MICROSCOPIC
Bilirubin Urine: NEGATIVE
Glucose, UA: NEGATIVE
Ketones, ur: NEGATIVE
Leukocytes, UA: NEGATIVE
Nitrite: NEGATIVE
Protein, ur: NEGATIVE
Specific Gravity, Urine: 1.01
Urobilinogen, UA: 0.2
pH: 7.5

## 2011-04-02 LAB — KLEIHAUER-BETKE STAIN
# Vials RhIg: 2
Fetal Cells %: 0.3
Quantitation Fetal Hemoglobin: 15

## 2011-04-02 LAB — CBC
HCT: 32.9 — ABNORMAL LOW
HCT: 37.5
Hemoglobin: 11.4 — ABNORMAL LOW
Hemoglobin: 12.9
MCHC: 34.5
MCHC: 34.6
MCV: 100.9 — ABNORMAL HIGH
MCV: 99.5
Platelets: 141 — ABNORMAL LOW
Platelets: 178
RBC: 3.27 — ABNORMAL LOW
RBC: 3.77 — ABNORMAL LOW
RDW: 13.7
RDW: 13.9
WBC: 10.7 — ABNORMAL HIGH
WBC: 9.2

## 2011-04-02 LAB — URINE MICROSCOPIC-ADD ON

## 2011-04-02 LAB — RH IMMUNE GLOB WKUP(>/=20WKS)(NOT WOMEN'S HOSP): Fetal Screen: POSITIVE

## 2011-04-02 LAB — RPR: RPR Ser Ql: NONREACTIVE

## 2011-04-04 LAB — WET PREP, GENITAL
Clue Cells Wet Prep HPF POC: NONE SEEN
Trich, Wet Prep: NONE SEEN
Yeast Wet Prep HPF POC: NONE SEEN

## 2011-04-04 LAB — CBC
HCT: 38
Hemoglobin: 12.9
MCHC: 34
MCV: 97.1
Platelets: 267
RBC: 3.92
RDW: 13.9
WBC: 8.7

## 2011-04-04 LAB — GC/CHLAMYDIA PROBE AMP, GENITAL
Chlamydia, DNA Probe: NEGATIVE
GC Probe Amp, Genital: NEGATIVE

## 2011-04-08 LAB — COMPREHENSIVE METABOLIC PANEL
ALT: 120 — ABNORMAL HIGH
AST: 70 — ABNORMAL HIGH
Albumin: 3.7
Alkaline Phosphatase: 208 — ABNORMAL HIGH
BUN: 13
CO2: 24
Calcium: 9.4
Chloride: 106
Creatinine, Ser: 0.81
GFR calc Af Amer: >60
GFR calc non Af Amer: >60
Glucose, Bld: 89
Potassium: 3.6
Sodium: 138
Total Bilirubin: 0.6
Total Protein: 7.6

## 2011-04-08 LAB — DIFFERENTIAL
Basophils Absolute: 0
Basophils Relative: 0
Eosinophils Absolute: 0.1
Eosinophils Relative: 2
Lymphocytes Relative: 36
Lymphs Abs: 3
Monocytes Absolute: 0.6
Monocytes Relative: 7
Neutro Abs: 4.5
Neutrophils Relative %: 55

## 2011-04-08 LAB — CBC
HCT: 42.3
Hemoglobin: 14.5
MCHC: 34.2
MCV: 93.7
Platelets: 295
RBC: 4.51
RDW: 12.6
WBC: 8.2

## 2011-04-08 LAB — RAPID URINE DRUG SCREEN, HOSP PERFORMED
Amphetamines: NOT DETECTED
Barbiturates: NOT DETECTED
Benzodiazepines: POSITIVE — AB
Cocaine: NOT DETECTED
Opiates: POSITIVE — AB
Tetrahydrocannabinol: POSITIVE — AB

## 2011-04-08 LAB — PREGNANCY, URINE: Preg Test, Ur: NEGATIVE

## 2011-04-08 LAB — ETHANOL: Alcohol, Ethyl (B): <5

## 2011-04-17 LAB — RH IMMUNE GLOBULIN WORKUP (NOT WOMEN'S HOSP)
ABO/RH(D): O NEG
Antibody Screen: NEGATIVE

## 2011-05-10 ENCOUNTER — Encounter (HOSPITAL_COMMUNITY): Payer: Self-pay | Admitting: *Deleted

## 2011-05-10 ENCOUNTER — Inpatient Hospital Stay (HOSPITAL_COMMUNITY)
Admission: AD | Admit: 2011-05-10 | Discharge: 2011-05-10 | Disposition: A | Discharge status: Home / Self Care | Payer: Medicaid Other | Source: Ambulatory Visit | Attending: Obstetrics and Gynecology | Admitting: Obstetrics and Gynecology

## 2011-05-10 DIAGNOSIS — G44209 Tension-type headache, unspecified, not intractable: Secondary | ICD-10-CM

## 2011-05-10 DIAGNOSIS — O1202 Gestational edema, second trimester: Secondary | ICD-10-CM

## 2011-05-10 DIAGNOSIS — IMO0002 Reserved for concepts with insufficient information to code with codable children: Secondary | ICD-10-CM | POA: Insufficient documentation

## 2011-05-10 MED ORDER — ACETAMINOPHEN 500 MG PO TABS
1000.0000 mg | ORAL_TABLET | Freq: Once | ORAL | Status: AC
  Administered 2011-05-10: 1000 mg via ORAL
  Filled 2011-05-10: qty 2

## 2011-05-10 NOTE — Progress Notes (Signed)
Pt states, " My feet started swelling 3 days ago. I drank water and elevated them, but when I woke up they were worse. I've had headaches off and on today too."

## 2011-05-10 NOTE — ED Provider Notes (Signed)
35 y.o.[id number] @22  wks Chief Complaint  Patient presents with  . Foot Swelling    SUBJECTIVE  HPI:  Reports bilateral feet and lower extremity edema present for 3 days and worse with standing. Minimal relief with leg elevation. Woke with a frontal headache today which has waxed and waned. No lateralizing neurologic symptoms.Takes methadone in am. Drinks a lot of coke.  Past Medical History  Diagnosis Date  . Kidney stones    Past Surgical History  Procedure Date  . Cesarean section    History   Social History  . Marital Status: Legally Separated    Spouse Name: N/A    Number of Children: N/A  . Years of Education: N/A   Occupational History  . Not on file.   Social History Main Topics  . Smoking status: Current Everyday Smoker  . Smokeless tobacco: Not on file  . Alcohol Use: Yes  . Drug Use: Yes  . Sexually Active:    Other Topics Concern  . Not on file   Social History Narrative  . No narrative on file   No current facility-administered medications on file prior to encounter.   Current Outpatient Prescriptions on File Prior to Encounter  Medication Sig Dispense Refill  . methadone (DOLOPHINE) 10 MG tablet Take 10 mg by mouth every 8 (eight) hours.        . ondansetron (ZOFRAN) 8 MG tablet Take by mouth every 8 (eight) hours as needed.         Allergies  Allergen Reactions  . Penicillins Hives    ROS: Pertinent items in HPI  OBJECTIVE  BP 102/50  Pulse 59  Temp(Src) 98 F (36.7 C) (Oral)  Resp 18  Ht 5' 8.5" (1.74 m)  Wt 106.765 kg (235 lb 6 oz)  BMI 35.27 kg/m2  Doppler FHR 145   Physical Exam  Constitutional: She is oriented to person, place, and time and well-developed, well-nourished, and in no distress. No distress.  HENT:  Head: Normocephalic.  Eyes: Pupils are equal, round, and reactive to light.  Neck: Neck supple.  Abdominal: Soft. There is tenderness. There is no guarding.       Minimal RUQ tenderness. Murphy's neg.  Large  diastasis recti  Musculoskeletal: Normal range of motion. She exhibits edema. She exhibits no tenderness.       Bilat. 2+ pretibial pitting edema, 3+ ankle edema. Pulses full, equal.  Neurological: She is alert and oriented to person, place, and time.  Skin: Skin is warm and dry.  Psychiatric: Affect normal.     MAU Course: Tylenol 100mg  given with relief of headache  ASSESSMENT  Gestational edema Tension headache  PLAN  Leg elevation, avoid prolonged standing. Support hose. Diet discussed. Tylenol for headache.

## 2011-05-10 NOTE — ED Notes (Signed)
D. Poe CNM in to see pt 

## 2011-05-23 ENCOUNTER — Emergency Department (HOSPITAL_COMMUNITY)
Admission: EM | Admit: 2011-05-23 | Discharge: 2011-05-23 | Disposition: A | Discharge status: Home / Self Care | Payer: Medicaid Other | Attending: Emergency Medicine | Admitting: Emergency Medicine

## 2011-05-23 ENCOUNTER — Encounter (HOSPITAL_COMMUNITY): Payer: Self-pay

## 2011-05-23 DIAGNOSIS — F17200 Nicotine dependence, unspecified, uncomplicated: Secondary | ICD-10-CM | POA: Insufficient documentation

## 2011-05-23 DIAGNOSIS — O219 Vomiting of pregnancy, unspecified: Secondary | ICD-10-CM

## 2011-05-23 LAB — CBC
HCT: 33.5 % — ABNORMAL LOW (ref 36.0–46.0)
Hemoglobin: 12 g/dL (ref 12.0–15.0)
MCH: 33.3 pg (ref 26.0–34.0)
MCHC: 35.8 g/dL (ref 30.0–36.0)
MCV: 93.1 fL (ref 78.0–100.0)
Platelets: 205 10*3/uL (ref 150–400)
RBC: 3.6 MIL/uL — ABNORMAL LOW (ref 3.87–5.11)
RDW: 13.7 % (ref 11.5–15.5)
WBC: 12.8 10*3/uL — ABNORMAL HIGH (ref 4.0–10.5)

## 2011-05-23 LAB — URINALYSIS, ROUTINE W REFLEX MICROSCOPIC
Bilirubin Urine: NEGATIVE
Glucose, UA: NEGATIVE mg/dL
Hgb urine dipstick: NEGATIVE
Leukocytes, UA: NEGATIVE
Nitrite: NEGATIVE
Protein, ur: NEGATIVE mg/dL
Specific Gravity, Urine: 1.018 (ref 1.005–1.030)
Urobilinogen, UA: 1 mg/dL (ref 0.0–1.0)
pH: 7 (ref 5.0–8.0)

## 2011-05-23 LAB — COMPREHENSIVE METABOLIC PANEL
ALT: 11 U/L (ref 0–35)
AST: 14 U/L (ref 0–37)
Albumin: 3.1 g/dL — ABNORMAL LOW (ref 3.5–5.2)
Alkaline Phosphatase: 71 U/L (ref 39–117)
BUN: 8 mg/dL (ref 6–23)
CO2: 21 mEq/L (ref 19–32)
Calcium: 9 mg/dL (ref 8.4–10.5)
Chloride: 102 mEq/L (ref 96–112)
Creatinine, Ser: 0.53 mg/dL (ref 0.50–1.10)
GFR calc Af Amer: >90 mL/min (ref >90)
GFR calc non Af Amer: >90 mL/min (ref >90)
Glucose, Bld: 105 mg/dL — ABNORMAL HIGH (ref 70–99)
Potassium: 3.6 mEq/L (ref 3.5–5.1)
Sodium: 133 mEq/L — ABNORMAL LOW (ref 135–145)
Total Bilirubin: 0.1 mg/dL — ABNORMAL LOW (ref 0.3–1.2)
Total Protein: 6.6 g/dL (ref 6.0–8.3)

## 2011-05-23 LAB — DIFFERENTIAL
Basophils Absolute: 0 10*3/uL (ref 0.0–0.1)
Basophils Relative: 0 % (ref 0–1)
Eosinophils Absolute: 0 10*3/uL (ref 0.0–0.7)
Eosinophils Relative: 0 % (ref 0–5)
Lymphocytes Relative: 6 % — ABNORMAL LOW (ref 12–46)
Lymphs Abs: 0.7 10*3/uL (ref 0.7–4.0)
Monocytes Absolute: 0.2 10*3/uL (ref 0.1–1.0)
Monocytes Relative: 2 % — ABNORMAL LOW (ref 3–12)
Neutro Abs: 11.8 10*3/uL — ABNORMAL HIGH (ref 1.7–7.7)
Neutrophils Relative %: 92 % — ABNORMAL HIGH (ref 43–77)

## 2011-05-23 MED ORDER — METOCLOPRAMIDE HCL 5 MG/ML IJ SOLN
10.0000 mg | Freq: Once | INTRAMUSCULAR | Status: AC
  Administered 2011-05-23: 10 mg via INTRAVENOUS
  Filled 2011-05-23: qty 2

## 2011-05-23 MED ORDER — FAMOTIDINE IN NACL 20-0.9 MG/50ML-% IV SOLN
20.0000 mg | Freq: Once | INTRAVENOUS | Status: AC
Start: 2011-05-23 — End: 2011-05-23
  Administered 2011-05-23: 20 mg via INTRAVENOUS
  Filled 2011-05-23: qty 50

## 2011-05-23 MED ORDER — DEXTROSE 5 % AND 0.9 % NACL IV BOLUS
1000.0000 mL | Freq: Once | INTRAVENOUS | Status: AC
  Administered 2011-05-23: 1000 mL via INTRAVENOUS

## 2011-05-23 MED ORDER — ONDANSETRON HCL 4 MG/2ML IJ SOLN
INTRAMUSCULAR | Status: AC
  Filled 2011-05-23: qty 2

## 2011-05-23 MED ORDER — DEXTROSE-NACL 5-0.9 % IV SOLN
INTRAVENOUS | Status: DC
  Administered 2011-05-23: 09:00:00 via INTRAVENOUS

## 2011-05-23 MED ORDER — METOCLOPRAMIDE HCL 10 MG PO TABS: 10.0000 mg | ORAL_TABLET | Freq: Four times a day (QID) | ORAL | Status: DC

## 2011-05-23 NOTE — ED Provider Notes (Signed)
History     CSN: [id number] Arrival date & time: 05/23/2011  8:26 AM   First MD Initiated Contact with Patient 05/23/11 0845      Chief Complaint  Patient presents with  . Nausea  . Emesis    (Consider location/radiation/quality/duration/timing/severity/associated sxs/prior treatment) Patient is a 35 y.o. female presenting with vomiting. The history is provided by the patient.  Emesis    she is pregnant at [redacted] weeks, and has been having nausea and vomiting throughout her pregnancy. Vomiting has been worse over the last 10 days. There's been associated epigastric burning which is currently rated at 3/10, but has been as severe as 10 out of 10. She's not had any fever, chills, or sweats. There's been no diarrhea. She has been receiving prenatal care through [facility]. 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 She has been taking Zofran which has not been giving her relief. She is concerned that the baby is not getting adequate nutrition because she is not holding anything at all down. Symptoms are described as severe. Nothing has made it better, nothing has made it worse. She did receive a dose of Zofran intravenously in the ambulance coming in which has given her slight relief.  Past Medical History  Diagnosis Date  . Kidney stones     Past Surgical History  Procedure Date  . Cesarean section   . Therapeutic abortion   . No past surgeries   . Cesarean section     Family History  Problem Relation Age of Onset  . Diabetes Maternal Grandfather     History  Substance Use Topics  . Smoking status: Current Everyday Smoker -- 1.0 packs/day  . Smokeless tobacco: Not on file  . Alcohol Use: No    OB History    Grav Para Term Preterm Abortions TAB SAB Ect Mult Living   6 3 3  2 1 1   3       Review of Systems  Gastrointestinal: Positive for vomiting.  All other systems reviewed and are negative.    Allergies   Penicillins  Home Medications   Current Outpatient Rx  Name Route Sig Dispense Refill  . METHADONE HCL 10 MG/ML PO CONC Oral Take 120 mg by mouth daily.      Marland Kitchen ONDANSETRON HCL 8 MG PO TABS Oral Take by mouth every 8 (eight) hours as needed. For nausea    . PRENATAL PLUS 27-1 MG PO TABS Oral Take 1 tablet by mouth daily.        BP 125/61  Temp(Src) 98.5 F (36.9 C) (Oral)  Resp 22  Ht 5\' 8"  (1.727 m)  Wt 220 lb (99.791 kg)  BMI 33.45 kg/m2  SpO2 99%  Physical Exam  Nursing note and vitals reviewed.  35 year old female who is resting comfortably and does not appear to be in any acute distress. Vital signs are significant for a respiratory rate of 22. PERRLA, EOMI. Oropharynx shows slightly dry mucous membranes. Neck is nontender and supple without adenopathy or JVD. Back is nontender there is no CVA tenderness. Lungs are clear without rales, wheezes, or rhonchi. Heart has regular rate and rhythm with a 1-2/6 blowing systolic murmur heard along the left sternal border. Abdomen is gravid with a small umbilical hernia present. Peristalsis is markedly diminished. There is no abdominal tenderness. Fundal size is seen this appropriate for stage of gestation. Extremities have no cyanosis or edema. Neurologic: Mental status is normal, cranial nerves are intact,  there are no motor Center deficits. Psychiatric: No abnormalities of mood or affect.  ED Course  Procedures (including critical care time)   Labs Reviewed  CBC  DIFFERENTIAL  URINALYSIS, ROUTINE W REFLEX MICROSCOPIC  COMPREHENSIVE METABOLIC PANEL   No results found.   No diagnosis found.  Results for orders placed during the hospital encounter of 05/23/11  CBC      Component Value Range   WBC 12.8 (*) 4.0 - 10.5 (K/uL)   RBC 3.60 (*) 3.87 - 5.11 (MIL/uL)   Hemoglobin 12.0  12.0 - 15.0 (g/dL)   HCT 40.9 (*) 81.1 - 46.0 (%)   MCV 93.1  78.0 - 100.0 (fL)   MCH 33.3  26.0 - 34.0 (pg)   MCHC 35.8  30.0 - 36.0 (g/dL)   RDW  91.4  78.2 - 95.6 (%)   Platelets 205  150 - 400 (K/uL)  DIFFERENTIAL      Component Value Range   Neutrophils Relative 92 (*) 43 - 77 (%)   Neutro Abs 11.8 (*) 1.7 - 7.7 (K/uL)   Lymphocytes Relative 6 (*) 12 - 46 (%)   Lymphs Abs 0.7  0.7 - 4.0 (K/uL)   Monocytes Relative 2 (*) 3 - 12 (%)   Monocytes Absolute 0.2  0.1 - 1.0 (K/uL)   Eosinophils Relative 0  0 - 5 (%)   Eosinophils Absolute 0.0  0.0 - 0.7 (K/uL)   Basophils Relative 0  0 - 1 (%)   Basophils Absolute 0.0  0.0 - 0.1 (K/uL)  URINALYSIS, ROUTINE W REFLEX MICROSCOPIC      Component Value Range   Color, Urine AMBER (*) YELLOW    Appearance CLOUDY (*) CLEAR    Specific Gravity, Urine 1.018  1.005 - 1.030    pH 7.0  5.0 - 8.0    Glucose, UA NEGATIVE  NEGATIVE (mg/dL)   Hgb urine dipstick NEGATIVE  NEGATIVE    Bilirubin Urine NEGATIVE  NEGATIVE    Ketones, ur TRACE (*) NEGATIVE (mg/dL)   Protein, ur NEGATIVE  NEGATIVE (mg/dL)   Urobilinogen, UA 1.0  0.0 - 1.0 (mg/dL)   Nitrite NEGATIVE  NEGATIVE    Leukocytes, UA NEGATIVE  NEGATIVE   COMPREHENSIVE METABOLIC PANEL      Component Value Range   Sodium 133 (*) 135 - 145 (mEq/L)   Potassium 3.6  3.5 - 5.1 (mEq/L)   Chloride 102  96 - 112 (mEq/L)   CO2 21  19 - 32 (mEq/L)   Glucose, Bld 105 (*) 70 - 99 (mg/dL)   BUN 8  6 - 23 (mg/dL)   Creatinine, Ser 2.13  0.50 - 1.10 (mg/dL)   Calcium 9.0  8.4 - 08.6 (mg/dL)   Total Protein 6.6  6.0 - 8.3 (g/dL)   Albumin 3.1 (*) 3.5 - 5.2 (g/dL)   AST 14  0 - 37 (U/L)   ALT 11  0 - 35 (U/L)   Alkaline Phosphatase 71  39 - 117 (U/L)   Total Bilirubin 0.1 (*) 0.3 - 1.2 (mg/dL)   GFR calc non Af Amer >90  >90 (mL/min)   GFR calc Af Amer >90  >90 (mL/min)   No results found.  Urinalysis is significant for only trace ketones. After IV hydration and IV Reglan, patient is feeling much better. Since she seemed to have improved nausea control with Reglan, we'll try sending her home with a prescription for Reglan.  MDM  Hyperemesis  gravidarum. She needs to be evaluated for whether she  is ketotic. She will be given IV hydration and IV antiemetics.        Dione Booze, MD 05/23/11 1014

## 2011-05-23 NOTE — ED Notes (Signed)
Pt discharge infor rievied stated understanding, spouse with pt, amb with steady gait to discharge window.

## 2011-05-23 NOTE — ED Notes (Signed)
[id number]<YN> Expected date:05/23/11<BR> Expected time: 7:52 AM<BR> Means of arrival:Ambulance<BR> Comments:<BR> 24wks preg/vomiting

## 2011-05-23 NOTE — ED Notes (Signed)
Pt brought by ems from [facility], pt with c/o nausea/vomiting x 4 days but worsening since 4 am today. Pt received her methadone treatment prior to ems arrival. Pt is [redacted] weeks pregnant. This is pt's 6th pregnancy with 4 living children.

## 2011-06-22 ENCOUNTER — Inpatient Hospital Stay (HOSPITAL_COMMUNITY)
Admission: AD | Admit: 2011-06-22 | Discharge: 2011-06-22 | Disposition: A | Discharge status: Home / Self Care | Payer: Medicaid Other | Source: Ambulatory Visit | Attending: Obstetrics and Gynecology | Admitting: Obstetrics and Gynecology

## 2011-06-22 ENCOUNTER — Inpatient Hospital Stay (HOSPITAL_COMMUNITY): Payer: Medicaid Other

## 2011-06-22 ENCOUNTER — Encounter (HOSPITAL_COMMUNITY): Payer: Self-pay | Admitting: Obstetrics and Gynecology

## 2011-06-22 DIAGNOSIS — J9801 Acute bronchospasm: Secondary | ICD-10-CM | POA: Insufficient documentation

## 2011-06-22 DIAGNOSIS — O99891 Other specified diseases and conditions complicating pregnancy: Secondary | ICD-10-CM | POA: Insufficient documentation

## 2011-06-22 DIAGNOSIS — Z3480 Encounter for supervision of other normal pregnancy, unspecified trimester: Secondary | ICD-10-CM

## 2011-06-22 DIAGNOSIS — R079 Chest pain, unspecified: Secondary | ICD-10-CM | POA: Insufficient documentation

## 2011-06-22 DIAGNOSIS — R05 Cough: Secondary | ICD-10-CM | POA: Insufficient documentation

## 2011-06-22 DIAGNOSIS — O219 Vomiting of pregnancy, unspecified: Secondary | ICD-10-CM

## 2011-06-22 DIAGNOSIS — R059 Cough, unspecified: Secondary | ICD-10-CM | POA: Insufficient documentation

## 2011-06-22 DIAGNOSIS — J209 Acute bronchitis, unspecified: Secondary | ICD-10-CM

## 2011-06-22 DIAGNOSIS — J988 Other specified respiratory disorders: Secondary | ICD-10-CM | POA: Insufficient documentation

## 2011-06-22 DIAGNOSIS — O212 Late vomiting of pregnancy: Secondary | ICD-10-CM | POA: Insufficient documentation

## 2011-06-22 HISTORY — DX: Opioid use, unspecified, uncomplicated: F11.90

## 2011-06-22 LAB — CBC
HCT: 31.6 % — ABNORMAL LOW (ref 36.0–46.0)
Hemoglobin: 11.1 g/dL — ABNORMAL LOW (ref 12.0–15.0)
MCH: 33 pg (ref 26.0–34.0)
MCHC: 35.1 g/dL (ref 30.0–36.0)
MCV: 94 fL (ref 78.0–100.0)
Platelets: 245 10*3/uL (ref 150–400)
RBC: 3.36 MIL/uL — ABNORMAL LOW (ref 3.87–5.11)
RDW: 13.5 % (ref 11.5–15.5)
WBC: 16.8 10*3/uL — ABNORMAL HIGH (ref 4.0–10.5)

## 2011-06-22 MED ORDER — ALBUTEROL SULFATE HFA 108 (90 BASE) MCG/ACT IN AERS: 2.0000 | INHALATION_SPRAY | Freq: Four times a day (QID) | RESPIRATORY_TRACT | Status: DC | PRN

## 2011-06-22 MED ORDER — ALBUTEROL SULFATE (5 MG/ML) 0.5% IN NEBU
2.5000 mg | INHALATION_SOLUTION | Freq: Once | RESPIRATORY_TRACT | Status: AC
  Administered 2011-06-22: 2.5 mg via RESPIRATORY_TRACT

## 2011-06-22 MED ORDER — ALBUTEROL SULFATE (5 MG/ML) 0.5% IN NEBU
INHALATION_SOLUTION | RESPIRATORY_TRACT | Status: AC
  Administered 2011-06-22: 2.5 mg via RESPIRATORY_TRACT
  Filled 2011-06-22: qty 0.5

## 2011-06-22 MED ORDER — AZITHROMYCIN 250 MG PO TABS: ORAL_TABLET | ORAL | Status: AC

## 2011-06-22 NOTE — Progress Notes (Signed)
Pt presents to MAU with chief complaint of vomiting and chest discomfort. Chest discomfort started 4-5 days ago with cough/ congestion; no fever. Pt has tried taking several different cold medications but she throws it all up.

## 2011-06-22 NOTE — Progress Notes (Signed)
Patient reports has stayed sick with vomiting this entire pregnancy, cough, congestion ,chest pain, can't eat, not taking any medication for symptoms.

## 2011-06-22 NOTE — ED Provider Notes (Signed)
History   Misty Young is a 35 y.o. year old [phone] female at [redacted]w[redacted]d weeks gestation who presents to MAU reporting continuation of N/V of pregnancy and cough, congestion and chest discomfort from coughing x 1 week. She vomits about once a day at 5 am. N/V resolves after methadone dose.    Chief Complaint  Patient presents with  . Emesis  . Abdominal Pain   HPI  OB History    Grav Para Term Preterm Abortions TAB SAB Ect Mult Living   6 3 3  2 1 1   3       Past Medical History  Diagnosis Date  . Kidney stones   . Opiate use     Past Surgical History  Procedure Date  . Cesarean section   . Therapeutic abortion   . No past surgeries   . Cesarean section     Family History  Problem Relation Age of Onset  . Diabetes Maternal Grandfather     History  Substance Use Topics  . Smoking status: Current Everyday Smoker -- 1.0 packs/day  . Smokeless tobacco: Not on file  . Alcohol Use: No    Allergies:  Allergies  Allergen Reactions  . Penicillins Hives    Prescriptions prior to admission  Medication Sig Dispense Refill  . methadone (DOLOPHINE) 10 MG/ML solution Take 120 mg by mouth daily.        . ondansetron (ZOFRAN) 8 MG tablet Take 8 mg by mouth every 8 (eight) hours as needed. For nausea      . prenatal vitamin w/FE, FA (PRENATAL 1 + 1) 27-1 MG TABS Take 1 tablet by mouth daily.          Review of Systems  Constitutional: Negative for fever and chills.  HENT: Positive for congestion and sore throat (from coughing). Negative for neck pain.   Respiratory: Positive for cough (frequent, productive), sputum production, shortness of breath and wheezing.   Cardiovascular: Negative for chest pain.  Gastrointestinal: Negative for nausea, vomiting and abdominal pain.  Musculoskeletal: Positive for myalgias.   Physical Exam   Blood pressure 124/65, pulse 89, temperature 97.4 F (36.3 C), temperature source Oral, height 5\' 10"  (1.778 m), weight 99.338 kg (219  lb).  Physical Exam  Constitutional: She is oriented to person, place, and time. Vital signs are normal. She appears well-developed and well-nourished. She appears ill. She appears distressed.  Eyes: Conjunctivae are normal. Pupils are equal, round, and reactive to light.  Cardiovascular: Normal rate.   Respiratory: Tachypnea noted. No respiratory distress. She has wheezes. She has rhonchi.  GI: Soft.  Neurological: She is alert and oriented to person, place, and time.  Skin: Skin is warm and dry.  Psychiatric: She has a normal mood and affect.   Results for orders placed during the hospital encounter of 06/22/11 (from the past 24 hour(s))  CBC     Status: Abnormal   Collection Time   06/22/11  9:47 AM      Component Value Range   WBC 16.8 (*) 4.0 - 10.5 (K/uL)   RBC 3.36 (*) 3.87 - 5.11 (MIL/uL)   Hemoglobin 11.1 (*) 12.0 - 15.0 (g/dL)   HCT 14.7 (*) 82.9 - 46.0 (%)   MCV 94.0  78.0 - 100.0 (fL)   MCH 33.0  26.0 - 34.0 (pg)   MCHC 35.1  30.0 - 36.0 (g/dL)   RDW 56.2  13.0 - 86.5 (%)   Platelets 245  150 - 400 (K/uL)  Dg Chest 2 View  06/22/2011  *RADIOLOGY REPORT*  Clinical Data: Cough  CHEST - 2 VIEW  Comparison: None  Findings: The heart size and mediastinal contours are within normal limits.  Both lungs are clear.  The visualized skeletal structures are unremarkable.  IMPRESSION: No active cardiopulmonary abnormalities.  Original Report Authenticated By: Rosealee Albee, M.D.   MAU Course  Procedures  MDM Pt reports good response to albuterol nebulizer. Much less coughing. Wheezing improved.  Assessment and Plan  Assessment: 1. Lower respiratory infection w/ bronchospasm and leukocytosis  Plan; 1. D/C home per consult w/ Dr. Claiborne Billings 2. Rx Albuterol and Z-pack 3. Use Mucinex. Increase rest and fluids 4. Discussed smoking cessation and use of nicotine patch PRN  SMITH, [place] 06/22/2011, 9:19 AM

## 2011-06-24 ENCOUNTER — Inpatient Hospital Stay (HOSPITAL_COMMUNITY)
Admission: AD | Admit: 2011-06-24 | Discharge: 2011-06-24 | Disposition: A | Discharge status: Home / Self Care | Payer: Medicaid Other | Source: Ambulatory Visit | Attending: Obstetrics & Gynecology | Admitting: Obstetrics & Gynecology

## 2011-06-24 DIAGNOSIS — Z331 Pregnant state, incidental: Secondary | ICD-10-CM

## 2011-06-24 DIAGNOSIS — J40 Bronchitis, not specified as acute or chronic: Secondary | ICD-10-CM

## 2011-06-24 DIAGNOSIS — O99891 Other specified diseases and conditions complicating pregnancy: Secondary | ICD-10-CM | POA: Insufficient documentation

## 2011-06-24 DIAGNOSIS — S20219A Contusion of unspecified front wall of thorax, initial encounter: Secondary | ICD-10-CM | POA: Insufficient documentation

## 2011-06-24 MED ORDER — CYCLOBENZAPRINE HCL 10 MG PO TABS: 10.0000 mg | ORAL_TABLET | Freq: Three times a day (TID) | ORAL | Status: DC | PRN

## 2011-06-24 MED ORDER — FLUTICASONE PROPIONATE HFA 110 MCG/ACT IN AERO
2.0000 | INHALATION_SPRAY | Freq: Once | RESPIRATORY_TRACT | Status: AC
  Administered 2011-06-24: 2 via RESPIRATORY_TRACT
  Filled 2011-06-24: qty 12

## 2011-06-24 MED ORDER — CYCLOBENZAPRINE HCL 10 MG PO TABS
10.0000 mg | ORAL_TABLET | Freq: Once | ORAL | Status: AC
  Administered 2011-06-24: 10 mg via ORAL
  Filled 2011-06-24: qty 1

## 2011-06-24 MED ORDER — GUAIFENESIN 100 MG/5ML PO LIQD: 200.0000 mg | Freq: Three times a day (TID) | ORAL | Status: AC | PRN

## 2011-06-24 NOTE — ED Provider Notes (Signed)
Chief Complaint:  Chest Pain   Misty Young is  35 y.o. [id number]. [redacted]w[redacted]d  She presents complaining of Chest Pain . Onset is described as intermittent and has been present for a "few hours". States she was dx with bronchitis over the weekend but was not prescribed cough medicines. Believes she has a "broke rib" from coughing. Pain under left breast worse with coughing, movement and touch.   Reports good fetal movement, denies abd pain, blding or LOF Obstetrical/Gynecological History: OB History    Grav Para Term Preterm Abortions TAB SAB Ect Mult Living   6 3 3  2 1 1   3       Past Medical History: Past Medical History  Diagnosis Date  . Kidney stones   . Opiate use     Past Surgical History: Past Surgical History  Procedure Date  . Cesarean section   . Therapeutic abortion   . No past surgeries   . Cesarean section     Family History: Family History  Problem Relation Age of Onset  . Diabetes Maternal Grandfather     Social History: History  Substance Use Topics  . Smoking status: Current Everyday Smoker -- 1.0 packs/day  . Smokeless tobacco: Not on file  . Alcohol Use: No    Allergies:  Allergies  Allergen Reactions  . Penicillins Hives    Prescriptions prior to admission  Medication Sig Dispense Refill  . albuterol (PROVENTIL HFA;VENTOLIN HFA) 108 (90 BASE) MCG/ACT inhaler Inhale 2 puffs into the lungs every 6 (six) hours as needed for wheezing or shortness of breath.  1 Inhaler  1  . azithromycin (ZITHROMAX Z-PAK) 250 MG tablet Take two tablets on day one, then one tablet daily times four days  6 each  0  . methadone (DOLOPHINE) 10 MG/ML solution Take 120 mg by mouth daily.        . ondansetron (ZOFRAN) 8 MG tablet Take 8 mg by mouth every 8 (eight) hours as needed. For nausea      . prenatal vitamin w/FE, FA (PRENATAL 1 + 1) 27-1 MG TABS Take 1 tablet by mouth daily.          Review of Systems - Negative except what has bee reviewed in the  HPI  Physical Exam   Blood pressure 110/66, pulse 75, temperature 98.4 F (36.9 C), temperature source Oral, resp. rate 20, height 5\' 10"  (1.778 m), weight 216 lb 3.2 oz (98.068 kg), SpO2 94.00%.  General: General appearance - alert, well appearing, and in no distress and oriented to person, place, and time, uncomfortable, holding left upper chest Mental status - alert, oriented to person, place, and time, normal mood, behavior, speech, dress, motor activity, and thought processes, affect appropriate to mood Chest - clear to auscultation, no  rales or rhonchi, symmetric air entry, occassional expiratory wheezes in left upper lobe, chest wall tenderness noted left side, upper chest wall and under left breast at 4 0'clock Heart - normal rate, regular rhythm, normal S1, S2, no murmurs, rubs, clicks or gallops Abdomen - gravid, non tender FHR: category I tracing  Imaging Studies:  Dg Chest 2 View  06/22/2011  *RADIOLOGY REPORT*  Clinical Data: Cough  CHEST - 2 VIEW  Comparison: None  Findings: The heart size and mediastinal contours are within normal limits.  Both lungs are clear.  The visualized skeletal structures are unremarkable.  IMPRESSION: No active cardiopulmonary abnormalities.  Original Report Authenticated By: Rosealee Albee, M.D.   MD Consult:  Discussed with Dr. Arlyce Dice  Assessment: Resolving Bronchitis Bruised Ribs  Plan: Discharge home Splint ribs with short abd binder, applied prior to d/c Rx Guaifenesin, Flovent BID, Flexeril FU as scheduled in office  SHORES,SUZANNE E. 06/24/2011,2:03 AM

## 2011-06-24 NOTE — Progress Notes (Signed)
Pt dx bronchitis 12/16, coughing this evening, having increased left sided pain.  Pt feels like she "broke a rib."  Pt G6 P3 at 28.3wks.

## 2011-06-24 NOTE — Progress Notes (Signed)
Pt states she was diagnosed with bronchitis on Sunday here in [facility]-was given no cough medicine and states she has coughed so much she thinks she broke a rib on her left side under her breast

## 2011-08-26 ENCOUNTER — Encounter (HOSPITAL_COMMUNITY): Payer: Self-pay | Admitting: Pharmacy Technician

## 2011-09-03 ENCOUNTER — Inpatient Hospital Stay (HOSPITAL_COMMUNITY)
Admission: AD | Admit: 2011-09-03 | Discharge: 2011-09-07 | DRG: 765 | Disposition: A | Discharge status: Home / Self Care | Payer: Medicaid Other | Attending: Obstetrics & Gynecology | Admitting: Obstetrics & Gynecology

## 2011-09-03 ENCOUNTER — Encounter (HOSPITAL_COMMUNITY)
Admission: AD | Disposition: A | Discharge status: Home / Self Care | Payer: Self-pay | Source: Home / Self Care | Attending: Obstetrics & Gynecology

## 2011-09-03 ENCOUNTER — Inpatient Hospital Stay (HOSPITAL_COMMUNITY): Admission: RE | Admit: 2011-09-03 | Payer: Medicaid Other | Source: Ambulatory Visit

## 2011-09-03 ENCOUNTER — Encounter (HOSPITAL_COMMUNITY): Payer: Self-pay | Admitting: Anesthesiology

## 2011-09-03 ENCOUNTER — Encounter (HOSPITAL_COMMUNITY): Payer: Self-pay | Admitting: *Deleted

## 2011-09-03 ENCOUNTER — Inpatient Hospital Stay (HOSPITAL_COMMUNITY): Payer: Medicaid Other | Admitting: Anesthesiology

## 2011-09-03 DIAGNOSIS — Z302 Encounter for sterilization: Secondary | ICD-10-CM

## 2011-09-03 DIAGNOSIS — F1920 Other psychoactive substance dependence, uncomplicated: Secondary | ICD-10-CM | POA: Diagnosis present

## 2011-09-03 DIAGNOSIS — O34219 Maternal care for unspecified type scar from previous cesarean delivery: Principal | ICD-10-CM | POA: Diagnosis present

## 2011-09-03 LAB — CBC
HCT: 38.1 % (ref 36.0–46.0)
Hemoglobin: 12.8 g/dL (ref 12.0–15.0)
MCH: 32.7 pg (ref 26.0–34.0)
MCHC: 33.6 g/dL (ref 30.0–36.0)
MCV: 97.2 fL (ref 78.0–100.0)
Platelets: 209 10*3/uL (ref 150–400)
RBC: 3.92 MIL/uL (ref 3.87–5.11)
RDW: 15.1 % (ref 11.5–15.5)
WBC: 10.7 10*3/uL — ABNORMAL HIGH (ref 4.0–10.5)

## 2011-09-03 LAB — RPR: RPR Ser Ql: NONREACTIVE

## 2011-09-03 LAB — POCT FERN TEST: Fern Test: POSITIVE

## 2011-09-03 SURGERY — Surgical Case
Anesthesia: Spinal | Site: Abdomen | Wound class: Clean Contaminated

## 2011-09-03 MED ORDER — HYDROMORPHONE 0.3 MG/ML IV SOLN
INTRAVENOUS | Status: DC
  Administered 2011-09-03: 5.19 mg via INTRAVENOUS
  Administered 2011-09-03: 8.67 mg via INTRAVENOUS
  Administered 2011-09-03 (×2): via INTRAVENOUS
  Administered 2011-09-03: 9.33 mg via INTRAVENOUS
  Administered 2011-09-04: 2.39 mg via INTRAVENOUS
  Administered 2011-09-04: 3.19 mg via INTRAVENOUS

## 2011-09-03 MED ORDER — KETOROLAC TROMETHAMINE 30 MG/ML IJ SOLN
30.0000 mg | Freq: Four times a day (QID) | INTRAMUSCULAR | Status: AC | PRN
  Administered 2011-09-03 – 2011-09-04 (×4): 30 mg via INTRAVENOUS
  Filled 2011-09-03 (×3): qty 1

## 2011-09-03 MED ORDER — MORPHINE SULFATE (PF) 0.5 MG/ML IJ SOLN
INTRAMUSCULAR | Status: DC | PRN
  Administered 2011-09-03: .2 mg via INTRATHECAL

## 2011-09-03 MED ORDER — DIPHENHYDRAMINE HCL 12.5 MG/5ML PO ELIX
12.5000 mg | ORAL_SOLUTION | Freq: Four times a day (QID) | ORAL | Status: DC | PRN
  Filled 2011-09-03: qty 5

## 2011-09-03 MED ORDER — HYDROMORPHONE HCL PF 1 MG/ML IJ SOLN
INTRAMUSCULAR | Status: AC
  Filled 2011-09-03: qty 1

## 2011-09-03 MED ORDER — HYDROMORPHONE HCL PF 1 MG/ML IJ SOLN: 0.5000 mg | INTRAMUSCULAR | Status: DC

## 2011-09-03 MED ORDER — LACTATED RINGERS IV SOLN
INTRAVENOUS | Status: DC | PRN
  Administered 2011-09-03 (×3): via INTRAVENOUS

## 2011-09-03 MED ORDER — WITCH HAZEL-GLYCERIN EX PADS: 1.0000 "application " | MEDICATED_PAD | CUTANEOUS | Status: DC | PRN

## 2011-09-03 MED ORDER — FENTANYL CITRATE 0.05 MG/ML IJ SOLN
INTRAMUSCULAR | Status: AC
  Filled 2011-09-03: qty 2

## 2011-09-03 MED ORDER — DIPHENHYDRAMINE HCL 25 MG PO CAPS
25.0000 mg | ORAL_CAPSULE | ORAL | Status: DC | PRN
  Filled 2011-09-03: qty 1

## 2011-09-03 MED ORDER — IBUPROFEN 600 MG PO TABS: 600.0000 mg | ORAL_TABLET | Freq: Four times a day (QID) | ORAL | Status: DC | PRN

## 2011-09-03 MED ORDER — HYDROMORPHONE HCL PF 1 MG/ML IJ SOLN
0.2500 mg | INTRAMUSCULAR | Status: DC | PRN
  Administered 2011-09-03 (×7): 0.5 mg via INTRAVENOUS

## 2011-09-03 MED ORDER — KETOROLAC TROMETHAMINE 30 MG/ML IJ SOLN
INTRAMUSCULAR | Status: AC
  Administered 2011-09-04: 30 mg via INTRAVENOUS
  Filled 2011-09-03: qty 1

## 2011-09-03 MED ORDER — METOCLOPRAMIDE HCL 5 MG/ML IJ SOLN: 10.0000 mg | Freq: Three times a day (TID) | INTRAMUSCULAR | Status: DC | PRN

## 2011-09-03 MED ORDER — LACTATED RINGERS IV SOLN
INTRAVENOUS | Status: DC
  Administered 2011-09-03 – 2011-09-04 (×2): via INTRAVENOUS

## 2011-09-03 MED ORDER — OXYTOCIN 20 UNITS IN LACTATED RINGERS INFUSION - SIMPLE
INTRAVENOUS | Status: AC
  Administered 2011-09-03: 125 mL/h via INTRAVENOUS
  Filled 2011-09-03: qty 1000

## 2011-09-03 MED ORDER — NICOTINE 14 MG/24HR TD PT24
14.0000 mg | MEDICATED_PATCH | Freq: Every day | TRANSDERMAL | Status: DC
  Administered 2011-09-03: 14 mg via TRANSDERMAL
  Filled 2011-09-03 (×6): qty 1

## 2011-09-03 MED ORDER — FAMOTIDINE IN NACL 20-0.9 MG/50ML-% IV SOLN
INTRAVENOUS | Status: AC
  Administered 2011-09-03: 20 mg
  Filled 2011-09-03: qty 50

## 2011-09-03 MED ORDER — CEFAZOLIN SODIUM 1-5 GM-% IV SOLN
INTRAVENOUS | Status: AC
  Filled 2011-09-03: qty 50

## 2011-09-03 MED ORDER — KETOROLAC TROMETHAMINE 30 MG/ML IJ SOLN
INTRAMUSCULAR | Status: AC
  Administered 2011-09-03: 30 mg via INTRAVENOUS
  Filled 2011-09-03: qty 1

## 2011-09-03 MED ORDER — CEFAZOLIN SODIUM-DEXTROSE 2-3 GM-% IV SOLR: 2.0000 g | INTRAVENOUS | Status: DC

## 2011-09-03 MED ORDER — OXYCODONE-ACETAMINOPHEN 5-325 MG PO TABS
1.0000 | ORAL_TABLET | ORAL | Status: DC | PRN
  Administered 2011-09-04 – 2011-09-06 (×13): 2 via ORAL
  Filled 2011-09-03 (×13): qty 2

## 2011-09-03 MED ORDER — ZOLPIDEM TARTRATE 5 MG PO TABS: 5.0000 mg | ORAL_TABLET | Freq: Every evening | ORAL | Status: DC | PRN

## 2011-09-03 MED ORDER — EPHEDRINE 5 MG/ML INJ
INTRAVENOUS | Status: AC
  Filled 2011-09-03: qty 10

## 2011-09-03 MED ORDER — ONDANSETRON HCL 4 MG/2ML IJ SOLN
INTRAMUSCULAR | Status: DC | PRN
  Administered 2011-09-03: 4 mg via INTRAVENOUS

## 2011-09-03 MED ORDER — BUPIVACAINE IN DEXTROSE 0.75-8.25 % IT SOLN
INTRATHECAL | Status: DC | PRN
  Administered 2011-09-03: 12 mg via INTRATHECAL

## 2011-09-03 MED ORDER — KETOROLAC TROMETHAMINE 60 MG/2ML IM SOLN
60.0000 mg | Freq: Once | INTRAMUSCULAR | Status: AC | PRN
  Filled 2011-09-03: qty 2

## 2011-09-03 MED ORDER — KETOROLAC TROMETHAMINE 30 MG/ML IJ SOLN
30.0000 mg | Freq: Four times a day (QID) | INTRAMUSCULAR | Status: AC | PRN
  Administered 2011-09-03: 30 mg via INTRAMUSCULAR

## 2011-09-03 MED ORDER — DIPHENHYDRAMINE HCL 50 MG/ML IJ SOLN: 12.5000 mg | Freq: Four times a day (QID) | INTRAMUSCULAR | Status: DC | PRN

## 2011-09-03 MED ORDER — SIMETHICONE 80 MG PO CHEW
80.0000 mg | CHEWABLE_TABLET | Freq: Three times a day (TID) | ORAL | Status: DC
  Administered 2011-09-03 – 2011-09-06 (×11): 80 mg via ORAL

## 2011-09-03 MED ORDER — OXYTOCIN 20 UNITS IN LACTATED RINGERS INFUSION - SIMPLE
125.0000 mL/h | INTRAVENOUS | Status: AC
  Administered 2011-09-03: 125 mL/h via INTRAVENOUS

## 2011-09-03 MED ORDER — DIBUCAINE 1 % RE OINT
1.0000 "application " | TOPICAL_OINTMENT | RECTAL | Status: DC | PRN
  Administered 2011-09-04: 1 via RECTAL
  Filled 2011-09-03: qty 28

## 2011-09-03 MED ORDER — ONDANSETRON HCL 4 MG/2ML IJ SOLN: 4.0000 mg | Freq: Three times a day (TID) | INTRAMUSCULAR | Status: DC | PRN

## 2011-09-03 MED ORDER — NALOXONE HCL 0.4 MG/ML IJ SOLN: 0.4000 mg | INTRAMUSCULAR | Status: DC | PRN

## 2011-09-03 MED ORDER — OXYTOCIN 10 UNIT/ML IJ SOLN
INTRAMUSCULAR | Status: AC
  Filled 2011-09-03: qty 4

## 2011-09-03 MED ORDER — NALBUPHINE HCL 10 MG/ML IJ SOLN
5.0000 mg | INTRAMUSCULAR | Status: DC | PRN
  Filled 2011-09-03: qty 1

## 2011-09-03 MED ORDER — HYDROMORPHONE 0.3 MG/ML IV SOLN
INTRAVENOUS | Status: AC
  Filled 2011-09-03: qty 25

## 2011-09-03 MED ORDER — OXYTOCIN 20 UNITS IN LACTATED RINGERS INFUSION - SIMPLE
INTRAVENOUS | Status: DC | PRN
  Administered 2011-09-03: 20 [IU] via INTRAVENOUS

## 2011-09-03 MED ORDER — MENTHOL 3 MG MT LOZG: 1.0000 | LOZENGE | OROMUCOSAL | Status: DC | PRN

## 2011-09-03 MED ORDER — MEPERIDINE HCL 25 MG/ML IJ SOLN: 6.2500 mg | INTRAMUSCULAR | Status: DC | PRN

## 2011-09-03 MED ORDER — FENTANYL CITRATE 0.05 MG/ML IJ SOLN
INTRAMUSCULAR | Status: DC | PRN
  Administered 2011-09-03: 12.5 ug via INTRATHECAL

## 2011-09-03 MED ORDER — ONDANSETRON HCL 4 MG/2ML IJ SOLN: 4.0000 mg | INTRAMUSCULAR | Status: DC | PRN

## 2011-09-03 MED ORDER — CITRIC ACID-SODIUM CITRATE 334-500 MG/5ML PO SOLN
ORAL | Status: AC
  Administered 2011-09-03: 30 mL
  Filled 2011-09-03: qty 15

## 2011-09-03 MED ORDER — PRENATAL MULTIVITAMIN CH
1.0000 | ORAL_TABLET | Freq: Every day | ORAL | Status: DC
  Administered 2011-09-03 – 2011-09-07 (×5): 1 via ORAL
  Filled 2011-09-03 (×4): qty 1

## 2011-09-03 MED ORDER — CEFAZOLIN SODIUM-DEXTROSE 2-3 GM-% IV SOLR
2.0000 g | Freq: Three times a day (TID) | INTRAVENOUS | Status: DC
  Administered 2011-09-03 (×2): 2 g via INTRAVENOUS
  Filled 2011-09-03 (×2): qty 50

## 2011-09-03 MED ORDER — KETOROLAC TROMETHAMINE 30 MG/ML IJ SOLN: 15.0000 mg | Freq: Once | INTRAMUSCULAR | Status: DC | PRN

## 2011-09-03 MED ORDER — DIPHENHYDRAMINE HCL 50 MG/ML IJ SOLN: 25.0000 mg | INTRAMUSCULAR | Status: DC | PRN

## 2011-09-03 MED ORDER — SCOPOLAMINE 1 MG/3DAYS TD PT72: 1.0000 | MEDICATED_PATCH | Freq: Once | TRANSDERMAL | Status: DC

## 2011-09-03 MED ORDER — SIMETHICONE 80 MG PO CHEW
80.0000 mg | CHEWABLE_TABLET | ORAL | Status: DC | PRN
  Administered 2011-09-04 – 2011-09-06 (×3): 80 mg via ORAL

## 2011-09-03 MED ORDER — SENNOSIDES-DOCUSATE SODIUM 8.6-50 MG PO TABS
2.0000 | ORAL_TABLET | Freq: Every day | ORAL | Status: DC
  Administered 2011-09-03 – 2011-09-06 (×4): 2 via ORAL

## 2011-09-03 MED ORDER — LANOLIN HYDROUS EX OINT: 1.0000 "application " | TOPICAL_OINTMENT | CUTANEOUS | Status: DC | PRN

## 2011-09-03 MED ORDER — ONDANSETRON HCL 4 MG/2ML IJ SOLN
INTRAMUSCULAR | Status: AC
  Filled 2011-09-03: qty 2

## 2011-09-03 MED ORDER — ONDANSETRON HCL 4 MG PO TABS
4.0000 mg | ORAL_TABLET | ORAL | Status: DC | PRN
  Administered 2011-09-07: 4 mg via ORAL
  Filled 2011-09-03: qty 1

## 2011-09-03 MED ORDER — IBUPROFEN 600 MG PO TABS
600.0000 mg | ORAL_TABLET | Freq: Four times a day (QID) | ORAL | Status: DC
Start: 2011-09-03 — End: 2011-09-07
  Administered 2011-09-04 – 2011-09-07 (×13): 600 mg via ORAL
  Filled 2011-09-03: qty 2
  Filled 2011-09-03 (×11): qty 1

## 2011-09-03 MED ORDER — MORPHINE SULFATE 0.5 MG/ML IJ SOLN
INTRAMUSCULAR | Status: AC
  Filled 2011-09-03: qty 10

## 2011-09-03 MED ORDER — SODIUM CHLORIDE 0.9 % IV SOLN
1.0000 ug/kg/h | INTRAVENOUS | Status: DC | PRN
  Filled 2011-09-03: qty 2.5

## 2011-09-03 MED ORDER — METHADONE HCL 10 MG/ML PO CONC
120.0000 mg | Freq: Every day | ORAL | Status: DC
  Administered 2011-09-04 – 2011-09-07 (×4): 120 mg via ORAL
  Filled 2011-09-03 (×7): qty 12

## 2011-09-03 MED ORDER — SODIUM CHLORIDE 0.9 % IJ SOLN: 9.0000 mL | INTRAMUSCULAR | Status: DC | PRN

## 2011-09-03 MED ORDER — EPHEDRINE SULFATE 50 MG/ML IJ SOLN
INTRAMUSCULAR | Status: DC | PRN
  Administered 2011-09-03: 10 mg via INTRAVENOUS

## 2011-09-03 MED ORDER — DIPHENHYDRAMINE HCL 25 MG PO CAPS: 25.0000 mg | ORAL_CAPSULE | Freq: Four times a day (QID) | ORAL | Status: DC | PRN

## 2011-09-03 MED ORDER — DIPHENHYDRAMINE HCL 50 MG/ML IJ SOLN: 12.5000 mg | INTRAMUSCULAR | Status: DC | PRN

## 2011-09-03 MED ORDER — TETANUS-DIPHTH-ACELL PERTUSSIS 5-2.5-18.5 LF-MCG/0.5 IM SUSP
0.5000 mL | Freq: Once | INTRAMUSCULAR | Status: AC
  Administered 2011-09-04: 0.5 mL via INTRAMUSCULAR
  Filled 2011-09-03: qty 0.5

## 2011-09-03 MED ORDER — ONDANSETRON HCL 4 MG/2ML IJ SOLN: 4.0000 mg | Freq: Four times a day (QID) | INTRAMUSCULAR | Status: DC | PRN

## 2011-09-03 MED ORDER — SODIUM CHLORIDE 0.9 % IJ SOLN: 3.0000 mL | INTRAMUSCULAR | Status: DC | PRN

## 2011-09-03 SURGICAL SUPPLY — 32 items
CLIP FILSHIE TUBAL LIGA STRL (Clip) ×3 IMPLANT
CLOTH BEACON ORANGE TIMEOUT ST (SAFETY) ×3 IMPLANT
CONTAINER PREFILL 10% NBF 15ML (MISCELLANEOUS) IMPLANT
DRSG COVADERM 4X10 (GAUZE/BANDAGES/DRESSINGS) ×3 IMPLANT
ELECT REM PT RETURN 9FT ADLT (ELECTROSURGICAL) ×3
ELECTRODE REM PT RTRN 9FT ADLT (ELECTROSURGICAL) ×2 IMPLANT
EXTRACTOR VACUUM M CUP 4 TUBE (SUCTIONS) IMPLANT
GLOVE ECLIPSE 6.0 STRL STRAW (GLOVE) ×3 IMPLANT
GLOVE ECLIPSE 6.5 STRL STRAW (GLOVE) ×3 IMPLANT
GOWN PREVENTION PLUS LG XLONG (DISPOSABLE) ×9 IMPLANT
KIT ABG SYR 3ML LUER SLIP (SYRINGE) IMPLANT
NEEDLE HYPO 25X5/8 SAFETYGLIDE (NEEDLE) ×3 IMPLANT
NS IRRIG 1000ML POUR BTL (IV SOLUTION) ×3 IMPLANT
PACK C SECTION WH (CUSTOM PROCEDURE TRAY) ×3 IMPLANT
RETAINER VISCERAL (MISCELLANEOUS) ×3 IMPLANT
RTRCTR C-SECT PINK 25CM LRG (MISCELLANEOUS) IMPLANT
SLEEVE SCD COMPRESS KNEE MED (MISCELLANEOUS) IMPLANT
STAPLER VISISTAT 35W (STAPLE) IMPLANT
SUT PLAIN 0 NONE (SUTURE) IMPLANT
SUT VIC AB 0 CT1 27 (SUTURE) ×3
SUT VIC AB 0 CT1 27XBRD ANBCTR (SUTURE) ×6 IMPLANT
SUT VIC AB 1 CTX 36 (SUTURE) ×2
SUT VIC AB 1 CTX36XBRD ANBCTRL (SUTURE) ×4 IMPLANT
SUT VIC AB 3-0 CT1 27 (SUTURE) ×2
SUT VIC AB 3-0 CT1 TAPERPNT 27 (SUTURE) ×4 IMPLANT
SUT VIC AB 3-0 PS2 18 (SUTURE)
SUT VIC AB 3-0 PS2 18XBRD (SUTURE) IMPLANT
SUT VIC AB 3-0 SH 27 (SUTURE) ×1
SUT VIC AB 3-0 SH 27X BRD (SUTURE) ×2 IMPLANT
TOWEL OR 17X24 6PK STRL BLUE (TOWEL DISPOSABLE) ×6 IMPLANT
TRAY FOLEY CATH 14FR (SET/KITS/TRAYS/PACK) ×3 IMPLANT
WATER STERILE IRR 1000ML POUR (IV SOLUTION) ×3 IMPLANT

## 2011-09-03 NOTE — Addendum Note (Signed)
Addendum  created 09/03/11 0911 by Ahmad Vanwey L. Rodman Pickle, MD   Modules edited:Orders, PRL Based Order Sets

## 2011-09-03 NOTE — Anesthesia Procedure Notes (Signed)
Spinal  Patient location during procedure: OR Start time: 09/03/2011 7:05 AM End time: 09/03/2011 7:08 AM Staffing Anesthesiologist: Sandrea Hughs Performed by: anesthesiologist  Preanesthetic Checklist Completed: patient identified, site marked, surgical consent, pre-op evaluation, timeout performed, IV checked, risks and benefits discussed and monitors and equipment checked Spinal Block Patient position: sitting Prep: DuraPrep Patient monitoring: heart rate, cardiac monitor, continuous pulse ox and blood pressure Approach: midline Location: L3-4 Injection technique: single-shot Needle Needle type: Sprotte  Needle gauge: 24 G Needle length: 9 cm Needle insertion depth: 7 cm Assessment Sensory level: T4

## 2011-09-03 NOTE — Transfer of Care (Signed)
Immediate Anesthesia Transfer of Care Note  Patient: Misty Young  Procedure(s) Performed: Procedure(s) (LRB): CESAREAN SECTION WITH BILATERAL TUBAL LIGATION ()  Patient Location: PACU  Anesthesia Type: Spinal  Level of Consciousness: awake  Airway & Oxygen Therapy: Patient Spontanous Breathing  Post-op Assessment: Report given to PACU RN  Post vital signs: Reviewed and stable  Complications: No apparent anesthesia complications

## 2011-09-03 NOTE — Anesthesia Postprocedure Evaluation (Signed)
Anesthesia Post Note  Patient: Misty Young  Procedure(s) Performed: Procedure(s) (LRB): CESAREAN SECTION WITH BILATERAL TUBAL LIGATION ()  Anesthesia type: Spinal  Patient location: PACU  Post pain: Pain level controlled  Post assessment: Post-op Vital signs reviewed  Last Vitals:  Filed Vitals:   09/03/11 0813  BP:   Pulse:   Temp: 36.1 C  Resp:     Post vital signs: Reviewed  Level of consciousness: awake  Complications: No apparent anesthesia complications

## 2011-09-03 NOTE — Anesthesia Preprocedure Evaluation (Addendum)
Anesthesia Evaluation  Patient identified by MRN, date of birth, ID band Patient awake    Reviewed: Allergy & Precautions, H&P , NPO status , Patient's Chart, lab work & pertinent test results  Airway       Dental   Pulmonary neg pulmonary ROS,    Pulmonary exam normal       Cardiovascular neg cardio ROS     Neuro/Psych Negative Neurological ROS  Negative Psych ROS   GI/Hepatic negative GI ROS, Neg liver ROS,   Endo/Other  Negative Endocrine ROS  Renal/GU negative Renal ROS  Genitourinary negative   Musculoskeletal   Abdominal Normal abdominal exam  (+)   Peds negative pediatric ROS (+)  Hematology negative hematology ROS (+)   Anesthesia Other Findings   Reproductive/Obstetrics (+) Pregnancy                           Anesthesia Physical Anesthesia Plan  ASA: II and Emergent  Anesthesia Plan: Spinal   Post-op Pain Management:    Induction:   Airway Management Planned:   Additional Equipment:   Intra-op Plan:   Post-operative Plan:   Informed Consent: I have reviewed the patients History and Physical, chart, labs and discussed the procedure including the risks, benefits and alternatives for the proposed anesthesia with the patient or authorized representative who has indicated his/her understanding and acceptance.     Plan Discussed with: CRNA and Surgeon  Anesthesia Plan Comments:         Anesthesia Quick Evaluation

## 2011-09-03 NOTE — Op Note (Signed)
Patient Name: Misty Young MRN: [id number]  Date of Surgery: 09/03/2011    PREOPERATIVE DIAGNOSIS: Previous Cesarean Section in Labor  POSTOPERATIVE DIAGNOSIS: Previous Cesarean Section in Labor, Voluntary sterilization   PROCEDURE: Low transverse cesarean section, Tubal sterilization with Filshie clips  SURGEON: Caralyn Guile. Arlyce Dice M.D.  ANESTHESIA: Spinal  ESTIMATED BLOOD LOSS: 800 ml  FINDINGS: 7 lbs 14, F, Apgar 9,9; clear fluid, normal adnexa, thin lower uterine segment.   INDICATIONS: This is a 36 y.o.  Misty Young who is admitted in labor.  PROCEDURE IN DETAIL: The patient was taken to the operating room and spinal anesthesia was placed.  She was then placed in the supine position with left lateral displacement of the uterus. The abdomen was prepped and draped in a sterile fashion and the bladder was catheterized.  A low transverse abdominal incision was made and carried down to the fascia. The fascia was opened transversely and the rectus sheath was dissected from the underlying rectus muscle. The rectus midline was identified and opened by sharp and blunt dissection. The peritoneum was opened. An Alexis retractor was placed and the lower uterine segment was identified, entered transversely by careful sharp dissection, and extended bluntly.  The infant was delivered without difficulty. The placenta was delivered. The uterus was bluntly curettage. The lower segment was closed with running interlocking Vicryl 1 suture.   Hemostasis was obtained with vertical mattress sutures.  The fallopian tubes were identified bilaterally, elevated with Babcock clamps and occluded with Filshie clips.  The peritoneum and rectus muscle were closed in the midline with running 3-0 Vicryl suture. The fascia was closed with running 0 Vicryl suture and the skin was closed with staples. All sponge and instrument counts were correct.  The patient tolerated the procedure well and left the operating room  in good condition.

## 2011-09-03 NOTE — Brief Op Note (Signed)
Baby bracelets placed by E olson RN on Mom and Dad

## 2011-09-03 NOTE — Progress Notes (Signed)
To OR

## 2011-09-03 NOTE — Progress Notes (Signed)
Spoke with Dr. Rodman Pickle regarding pts pain at a 10, pt currently on methadone, orders received for additional pain medication

## 2011-09-03 NOTE — Anesthesia Postprocedure Evaluation (Signed)
  Anesthesia Post Note  Patient: Misty Young  Procedure(s) Performed: Procedure(s) (LRB): CESAREAN SECTION WITH BILATERAL TUBAL LIGATION ()  Anesthesia type: Spinal  Patient location: Mother/Baby  Post pain: Pain level controlled  Post assessment: Post-op Vital signs reviewed  Last Vitals:  Filed Vitals:   09/03/11 1700  BP: 132/76  Pulse: 80  Temp: 36.7 C  Resp: 18    Post vital signs: Reviewed  Level of consciousness: awake  Complications: No apparent anesthesia complications

## 2011-09-03 NOTE — H&P (Signed)
36 y.o. [id number]  Estimated Date of Delivery: 09/13/11 admitted at 38/[redacted] weeks gestation in active labor. 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 Prenatal labs: Blood Type:O, Rh Neg (received prophylactic RhoGAM).  Screening tests for HIV, Syphilis, Hepatitis B, Rubella sensitivity, fetal anomalies, gestational diabetes, and perineal group B strep colonization were negative.    Afebrile, VSS Heart and Lungs: No active disease Abdomen: soft, gravid, EFW AGA. Cervical exam:  4/80  Impression: Previous cesareans x3, active labor.  Plan:  Repeat C/S. Possible BTL.

## 2011-09-03 NOTE — Addendum Note (Signed)
Addendum  created 09/03/11 1833 by Cephus Shelling, CRNA   Modules edited:Notes Section

## 2011-09-03 NOTE — Progress Notes (Signed)
UR chart review completed.  

## 2011-09-04 LAB — TYPE AND SCREEN
ABO/RH(D): O NEG
Antibody Screen: POSITIVE
DAT, IgG: NEGATIVE

## 2011-09-04 LAB — CBC
HCT: 31.5 % — ABNORMAL LOW (ref 36.0–46.0)
Hemoglobin: 10.7 g/dL — ABNORMAL LOW (ref 12.0–15.0)
MCH: 33 pg (ref 26.0–34.0)
MCHC: 34 g/dL (ref 30.0–36.0)
MCV: 97.2 fL (ref 78.0–100.0)
Platelets: 153 10*3/uL (ref 150–400)
RBC: 3.24 MIL/uL — ABNORMAL LOW (ref 3.87–5.11)
RDW: 15 % (ref 11.5–15.5)
WBC: 10.3 10*3/uL (ref 4.0–10.5)

## 2011-09-04 MED ORDER — MEASLES, MUMPS & RUBELLA VAC ~~LOC~~ INJ
0.5000 mL | INJECTION | Freq: Once | SUBCUTANEOUS | Status: DC
  Filled 2011-09-04: qty 0.5

## 2011-09-04 NOTE — Progress Notes (Signed)
PSYCHOSOCIAL ASSESSMENT ~ MATERNAL/CHILD  Name: Misty Young Age: 36  Referral Date: 09/04/11  Reason/Source: Social situation / CN  I. FAMILY/HOME ENVIRONMENT  A. Child's Legal Guardian _X__Parent(s) ___Grandparent ___Foster parent ___DSS_________________  Name: Kallie Edward DOB: // Age: 61  Address: [address] [place], [place] [address]  Name: Eusebio Friendly DOB: // Age: 85  Address:  B. Other Household Members/Support Persons Name: Sharl Ma Relationship: grandmother DOB ___/___/___  Name: Relationship: DOB ___/___/___  Name: Relationship: DOB ___/___/___  Name: Relationship: DOB ___/___/___  C. Other Support:  II. PSYCHOSOCIAL DATA A. Information Source _X_Patient Interview __Family Interview __Other___________ B. Surveyor, quantity and [employer] __Employment:  _X_Medicaid [place]: [place] __Private Insurance: __Self Pay  _X_Food Stamps _X_WIC __Work First __Public Housing __Section 8  __Maternity Care Coordination/Child Service Coordination/Early Intervention  ___School: Grade:  __Other:  Salena Saner Cultural and Environment Information Cultural Issues Impacting Care:  III. STRENGTHS _X__Supportive family/friends  _X__Adequate Resources  ___Compliance with medical plan  _X__Home prepared for Child (including basic supplies)  ___Understanding of illness  ___Other:  RISK FACTORS AND CURRENT PROBLEMS ____No Problems Noted  Panic attacks  History of narcotic abuse  IV. SOCIAL WORK ASSESSMENT Sw referral received to asses pt's history of opioid addiction and panic attacks. Pt is currently living with her grandmother, who she identified as her primary support person. She has 3 other children, who are currently living with their father, pt's ex husband, in [place], [place]. Pt denies CPS involvement, as she told Sw that it was a mutual agreement, 1 year ago. Pt acknowledges her past addiction to "pain pills," stating she abused narcotics for 5 years before she sought treatment. She  started treatment (methadone maintenace) at [facility], in March 2012. She is taking 120mg  a day. Pt admits that she continued to abuse pain pills, at the beginning of treatment however denies using "in a long time." Pt also admits to a history of MJ use but denies any use since March 2012. Sw explained hospital drug testing and pt verbalized understaning. UDS is negative, meconium results are pending. Pt took medication to treat her anxiety symptoms, 8 years ago. Pt states she has learned to cope with the panic attacks and no longer in need of medication. Pt appears to be calm at this time and spoke openly with Sw. Sw discussed the possibility of withdrawal symptoms the baby may experience and NICU admission if necessary. She and FOB are aware of the possibility but hopeful that the infant will not withdraw. She reports having all the necessary supplies for the infant. Sw will follow up with drug screen results and make a referral if needed. Sw confirmed that pt does not have an open case with [facility] CPS, after speaking with intake worker.  V. SOCIAL WORK PLAN _X__No Further Intervention Required/No Barriers to Discharge  ___Psychosocial Support and Ongoing Assessment of Needs  ___Patient/Family Education:  ___Child Protective Services Report County___________ Date___/____/____  ___Information/Referral to [employer]_________________________  ___Other:

## 2011-09-04 NOTE — Progress Notes (Signed)
  Patient is eating, ambulating, voiding.  Pain control is good.  Filed Vitals:   09/03/11 2205 09/03/11 2340 09/04/11 0215 09/04/11 0615  BP: 137/84  133/76 133/80  Pulse: 62  72 65  Temp: 98.2 F (36.8 C)  98.2 F (36.8 C) 98.2 F (36.8 C)  TempSrc: Oral  Oral Oral  Resp: 20 14 11 11   Height:      Weight:      SpO2: 100% 94% 100% 100%    lungs:   clear to auscultation cor:    RRR Abdomen:  soft, appropriate tenderness, incisions intact and without erythema or exudate ex:    no cords   Lab Results  Component Value Date   WBC 10.3 09/04/2011   HGB 10.7* 09/04/2011   HCT 31.5* 09/04/2011   MCV 97.2 09/04/2011   PLT 153 09/04/2011    --/--/O NEG (02/27 0639)/Requiv  A/P    Post operative day 1.  Routine post op and postpartum care.  Expect d/c tomorrow.  Percocet for pain control.  Pt needs rubella vaccine.  Baby  A neg- no need for rhogam.

## 2011-09-05 MED ORDER — GUAIFENESIN 100 MG/5ML PO SOLN
5.0000 mL | ORAL | Status: DC | PRN
  Administered 2011-09-05 (×2): 100 mg via ORAL
  Filled 2011-09-05 (×2): qty 15

## 2011-09-05 NOTE — Progress Notes (Signed)
POD#2 s/p Repeat Cesarean & BTL  S: Doing well, notes some discomfort at right hand side of incision.  Ambulating, voiding, and eating well O: Filed Vitals:   09/04/11 0815 09/04/11 1341 09/04/11 2108 09/05/11 0500  BP: 136/84 129/83 122/78 142/82  Pulse: 61 66 73 76  Temp: 98.4 F (36.9 C) 98.7 F (37.1 C) 98.2 F (36.8 C) 97.9 F (36.6 C)  TempSrc: Oral Oral Oral Oral  Resp: 14 18 18 20   Height:      Weight:      SpO2: 94%      AOx3, NAD abd soft, approp tender, ND Inc C/D/I  A/P 1) Routine PO care 2) Continue to encourage ambulation  3) Bottle feeding 4) Peds wants to keep baby another day

## 2011-09-06 NOTE — Discharge Summary (Signed)
Obstetric Discharge Summary Reason for Admission: cesarean section Prenatal Procedures: NST, ultrasound and methadone treatment through a pain clinic for chronic pain Intrapartum Procedures: cesarean: low cervical, transverse and tubal ligation Postpartum Procedures: none Complications-Operative and Postpartum: none Hemoglobin  Date Value Range Status  09/04/2011 10.7* 12.0-15.0 (g/dL) Final     HCT  Date Value Range Status  09/04/2011 31.5* 36.0-46.0 (%) Final    Discharge Diagnoses: Term Pregnancy-delivered and chronic pain  Discharge Information: Date: 09/06/2011 Activity: pelvic rest Diet: routine Medications: Ibuprofen and Percocet Condition: stable Instructions: refer to practice specific booklet Discharge to: home   Newborn Data: Live born female  Birth Weight: 7 lb 14 oz (3572 g) APGAR: 9, 9  Home with mother.  Misty Young H. 09/06/2011, 9:37 AM

## 2011-09-06 NOTE — Progress Notes (Signed)
Subjective: [name]: Cesarean Delivery Patient reports incisional pain, tolerating PO, + flatus and no problems voiding.   Peds keeping baby due to withdrawal score being 9.  Objective: Vital signs in last 24 hours: Filed Vitals:   09/05/11 0500 09/05/11 1403 09/05/11 2124 09/06/11 0538  BP: 142/82 143/79 133/77 118/77  Pulse: 76 67 80 72  Temp: 97.9 F (36.6 C) 98 F (36.7 C) 98.6 F (37 C) 98.4 F (36.9 C)  TempSrc: Oral Oral Oral Oral  Resp: 20 20 18 18   Height:      Weight:      SpO2:        Physical Exam:  General: alert, cooperative and appears stated age 36: appropriate Uterine Fundus: firm Incision: healing well, no significant drainage, no dehiscence, no significant erythema    Assessment/Plan: Status post Cesarean section. Doing well postoperatively.  Continue current care. Anticipate D/C tomorrow  Misty Young H. 09/06/2011, 9:32 AM

## 2011-09-07 MED ORDER — OXYCODONE-ACETAMINOPHEN 5-325 MG PO TABS: 2.0000 | ORAL_TABLET | ORAL | Status: AC | PRN

## 2011-09-07 MED ORDER — PROMETHAZINE HCL 25 MG PO TABS
25.0000 mg | ORAL_TABLET | Freq: Once | ORAL | Status: AC
  Administered 2011-09-07: 25 mg via ORAL
  Filled 2011-09-07: qty 1

## 2011-09-07 MED ORDER — ONDANSETRON HCL 4 MG PO TABS: 4.0000 mg | ORAL_TABLET | Freq: Three times a day (TID) | ORAL | Status: AC | PRN

## 2011-09-07 NOTE — Progress Notes (Signed)
MD asked CSW to speak with MOB due to MOB being observably fatigued and falling asleep doing simple things like putting her shoes on.  MOB and FOB report MOB was sick "all" last night and this was cause for her fatigue.  Spoke with RN who indicates she was only reported MOB was sick once last night.  Continued conversing with FOB and MOB to get a better understanding of MOB emotional state.  No significant indicators given to CSW while talking with MOB, however CSW observed fatigue and MOB almost fell asleep while CSW in the room.  No further indication of pain pill use other than what has been given to MOB while in the hospital.  Please reconsult CSW if any further assistance needed.

## 2011-09-08 ENCOUNTER — Inpatient Hospital Stay (HOSPITAL_COMMUNITY)
Admission: RE | Admit: 2011-09-08 | Payer: Medicaid Other | Source: Ambulatory Visit | Admitting: Obstetrics & Gynecology

## 2011-09-08 ENCOUNTER — Encounter (HOSPITAL_COMMUNITY): Admission: RE | Payer: Self-pay | Source: Ambulatory Visit

## 2011-09-08 SURGERY — Surgical Case
Anesthesia: Regional | Laterality: Bilateral

## 2011-11-30 ENCOUNTER — Encounter (HOSPITAL_COMMUNITY): Payer: Self-pay | Admitting: *Deleted

## 2011-11-30 ENCOUNTER — Emergency Department (HOSPITAL_COMMUNITY)
Admission: EM | Admit: 2011-11-30 | Discharge: 2011-11-30 | Disposition: A | Discharge status: Home / Self Care | Payer: Medicaid Other | Attending: Emergency Medicine | Admitting: Emergency Medicine

## 2011-11-30 DIAGNOSIS — N760 Acute vaginitis: Secondary | ICD-10-CM | POA: Insufficient documentation

## 2011-11-30 DIAGNOSIS — I498 Other specified cardiac arrhythmias: Secondary | ICD-10-CM | POA: Insufficient documentation

## 2011-11-30 DIAGNOSIS — N939 Abnormal uterine and vaginal bleeding, unspecified: Secondary | ICD-10-CM

## 2011-11-30 DIAGNOSIS — R55 Syncope and collapse: Secondary | ICD-10-CM | POA: Insufficient documentation

## 2011-11-30 DIAGNOSIS — B9689 Other specified bacterial agents as the cause of diseases classified elsewhere: Secondary | ICD-10-CM | POA: Insufficient documentation

## 2011-11-30 DIAGNOSIS — A499 Bacterial infection, unspecified: Secondary | ICD-10-CM | POA: Insufficient documentation

## 2011-11-30 DIAGNOSIS — N898 Other specified noninflammatory disorders of vagina: Secondary | ICD-10-CM | POA: Insufficient documentation

## 2011-11-30 DIAGNOSIS — R5381 Other malaise: Secondary | ICD-10-CM | POA: Insufficient documentation

## 2011-11-30 LAB — URINALYSIS, ROUTINE W REFLEX MICROSCOPIC
Bilirubin Urine: NEGATIVE
Glucose, UA: NEGATIVE mg/dL
Ketones, ur: NEGATIVE mg/dL
Leukocytes, UA: NEGATIVE
Nitrite: NEGATIVE
Protein, ur: NEGATIVE mg/dL
Specific Gravity, Urine: 1.013 (ref 1.005–1.030)
Urobilinogen, UA: 2 mg/dL — ABNORMAL HIGH (ref 0.0–1.0)
pH: 7 (ref 5.0–8.0)

## 2011-11-30 LAB — CBC
HCT: 38.4 % (ref 36.0–46.0)
Hemoglobin: 13 g/dL (ref 12.0–15.0)
MCH: 31.7 pg (ref 26.0–34.0)
MCHC: 33.9 g/dL (ref 30.0–36.0)
MCV: 93.7 fL (ref 78.0–100.0)
Platelets: 244 10*3/uL (ref 150–400)
RBC: 4.1 MIL/uL (ref 3.87–5.11)
RDW: 13.2 % (ref 11.5–15.5)
WBC: 4.7 10*3/uL (ref 4.0–10.5)

## 2011-11-30 LAB — WET PREP, GENITAL
Trich, Wet Prep: NONE SEEN
Yeast Wet Prep HPF POC: NONE SEEN

## 2011-11-30 LAB — URINE MICROSCOPIC-ADD ON

## 2011-11-30 MED ORDER — METRONIDAZOLE 500 MG PO TABS: 500.0000 mg | ORAL_TABLET | Freq: Two times a day (BID) | ORAL | Status: AC

## 2011-11-30 NOTE — ED Notes (Signed)
Pt from home with reports of heavy (with blood clots) vaginal bleeding for 1 week. Pt reports having to change sanitary napkins every 1-2 hours as well as dizziness that started yesterday. Pt also endorses lower abdominal pain and generalized weakness.

## 2011-11-30 NOTE — ED Notes (Signed)
MD at bedside. 

## 2011-11-30 NOTE — ED Notes (Signed)
Family at bedside. 

## 2011-11-30 NOTE — Discharge Instructions (Signed)
Dysfunctional Uterine Bleeding Normally, menstrual periods begin between ages 11 to 17 in young women. A normal menstrual cycle/period may begin every 23 days up to 35 days and lasts from 1 to 7 days. Around 12 to 14 days before your menstrual period starts, ovulation (ovary produces an egg) occurs. When counting the time between menstrual periods, count from the first day of bleeding of the previous period to the first day of bleeding of the next period. Dysfunctional (abnormal) uterine bleeding is bleeding that is different from a normal menstrual period. Your periods may come earlier or later than usual. They may be lighter, have blood clots or be heavier. You may have bleeding between periods, or you may skip one period or more. You may have bleeding after sexual intercourse, bleeding after menopause, or no menstrual period. CAUSES   Pregnancy (normal, miscarriage, tubal).   IUDs (intrauterine device, birth control).   Birth control pills.   Hormone treatment.   Menopause.   Infection of the cervix.   Blood clotting problems.   Infection of the inside lining of the uterus.   Endometriosis, inside lining of the uterus growing in the pelvis and other female organs.   Adhesions (scar tissue) inside the uterus.   Obesity or severe weight loss.   Uterine polyps inside the uterus.   Cancer of the vagina, cervix, or uterus.   Ovarian cysts or polycystic ovary syndrome.   Medical problems (diabetes, thyroid disease).   Uterine fibroids (noncancerous tumor).   Problems with your female hormones.   Endometrial hyperplasia, very thick lining and enlarged cells inside of the uterus.   Medicines that interfere with ovulation.   Radiation to the pelvis or abdomen.   Chemotherapy.  DIAGNOSIS   Your doctor will discuss the history of your menstrual periods, medicines you are taking, changes in your weight, stress in your life, and any medical problems you may have.   Your doctor  will do a physical and pelvic examination.   Your doctor may want to perform certain tests to make a diagnosis, such as:   Pap test.   Blood tests.   Cultures for infection.   CT scan.   Ultrasound.   Hysteroscopy.   Laparoscopy.   MRI.   Hysterosalpingography.   D and C.   Endometrial biopsy.  TREATMENT  Treatment will depend on the cause of the dysfunctional uterine bleeding (DUB). Treatment may include:  Observing your menstrual periods for a couple of months.   Prescribing medicines for medical problems, including:   Antibiotics.   Hormones.   Birth control pills.   Removing an IUD (intrauterine device, birth control).   Surgery:   D and C (scrape and remove tissue from inside the uterus).   Laparoscopy (examine inside the abdomen with a lighted tube).   Uterine ablation (destroy lining of the uterus with electrical current, laser, heat, or freezing).   Hysteroscopy (examine cervix and uterus with a lighted tube).   Hysterectomy (remove the uterus).  HOME CARE INSTRUCTIONS   If medicines were prescribed, take exactly as directed. Do not change or switch medicines without consulting your caregiver.   Long term heavy bleeding may result in iron deficiency. Your caregiver may have prescribed iron pills. They help replace the iron that your body lost from heavy bleeding. Take exactly as directed.   Do not take aspirin or medicines that contain aspirin one week before or during your menstrual period. Aspirin may make the bleeding worse.   If you need   to change your sanitary pad or tampon more than once every 2 hours, stay in bed with your feet elevated and a cold pack on your lower abdomen. Rest as much as possible, until the bleeding stops or slows down.   Eat well-balanced meals. Eat foods high in iron. Examples are:   Leafy green vegetables.   Whole-grain breads and cereals.   Eggs.   Meat.   Liver.   Do not try to lose weight until the  abnormal bleeding has stopped and your blood iron level is back to normal. Do not lift more than ten pounds or do strenuous activities when you are bleeding.   For a couple of months, make note on your calendar, marking the start and ending of your period, and the type of bleeding (light, medium, heavy, spotting, clots or missed periods). This is for your caregiver to better evaluate your problem.  SEEK MEDICAL CARE IF:   You develop nausea (feeling sick to your stomach) and vomiting, dizziness, or diarrhea while you are taking your medicine.   You are getting lightheaded or weak.   You have any problems that may be related to the medicine you are taking.   You develop pain with your DUB.   You want to remove your IUD.   You want to stop or change your birth control pills or hormones.   You have any type of abnormal bleeding mentioned above.   You are over 3 years old and have not had a menstrual period yet.   You are 36 years old and you are still having menstrual periods.   You have any of the symptoms mentioned above.   You develop a rash.  SEEK IMMEDIATE MEDICAL CARE IF:   An oral temperature above 102 F (38.9 C) develops.   You develop chills.   You are changing your sanitary pad or tampon more than once an hour.   You develop abdominal pain.   You pass out or faint.  Document Released: 06/20/2000 Document Revised: 06/12/2011 Document Reviewed: 05/22/2009 [facility] Patient Information 2012 [place], [place].  RESOURCE GUIDE  Dental Problems  Patients with Medicaid: [facility] [phone] [address]                                           [phone] Phone:  [phone]                                                  Phone:  [phone]  If unable to pay or uninsured, contact:  [facility] or [facility]. to become qualified for the adult dental clinic.  Chronic Pain Problems Contact  [facility]  [phone] Patients need to be referred by their primary care doctor.  Insufficient Money for Medicine Contact [employer]:  call "211" or [facility] [phone].  No Primary Care Doctor Call [facility]  [phone] Other agencies that provide inexpensive medical care    [facility]  [phone]    [facility]  [phone]    [facility]  [phone]    Women's Clinic  [phone]    Planned Parenthood  [phone]    [facility]  [phone]  Psychological Services [facility]  [phone] [facility] [place] [facility]  [phone] [facility]   [phone] (emergency services [phone])  Substance Abuse Resources [facility]  [phone] [facility] [phone] [facility] [phone] Floydene Flock [phone] Residential & Outpatient Substance Abuse Program  [phone]  Abuse/Neglect [facility] Child Abuse Hotline [phone] [facility] Child Abuse Hotline [phone] (After Hours)  Emergency Shelter [facility] [phone]  Maternity Homes Room at the [facility] [phone] [facility] [phone]  MRSA Hotline #:   478-645-0215    [facility] Resources  [facility]     [employer]                          [facility] Dept. [address]                        [address]      [address]  Blondell Reveal Phone:  [phone]                                   Phone:  [phone]                 Phone:  [phone]  [facility] Phone:  [phone]  [facility] Child Abuse Hotline [phone] [phone] (After Hours)  Bacterial Vaginosis Bacterial vaginosis (BV) is a vaginal infection where the normal balance  of bacteria in the vagina is disrupted. The normal balance is then replaced by an overgrowth of certain bacteria. There are several different kinds of bacteria that can cause BV. BV is the most common vaginal infection in women of childbearing age. CAUSES   The cause of BV is not fully understood. BV develops when there is an increase or imbalance of harmful bacteria.   Some activities or behaviors can upset the normal balance of bacteria in the vagina and put women at increased risk including:   Having a new sex partner or multiple sex partners.   Douching.   Using an intrauterine device (IUD) for contraception.   It is not clear what role sexual activity plays in the development of BV. However, women that have never had sexual intercourse are rarely infected with BV.  Women do not get BV from toilet seats, bedding, swimming pools or from touching objects around them.  SYMPTOMS   Grey vaginal discharge.   A fish-like odor with discharge, especially after sexual intercourse.   Itching or burning of the vagina and vulva.   Burning or pain with urination.   Some women have no signs or symptoms at all.  DIAGNOSIS  Your caregiver must examine the vagina for signs  of BV. Your caregiver will perform lab tests and look at the sample of vaginal fluid through a microscope. They will look for bacteria and abnormal cells (clue cells), a pH test higher than 4.5, and a positive amine test all associated with BV.  RISKS AND COMPLICATIONS   Pelvic inflammatory disease (PID).   Infections following gynecology surgery.   Developing HIV.   Developing herpes virus.  TREATMENT  Sometimes BV will clear up without treatment. However, all women with symptoms of BV should be treated to avoid complications, especially if gynecology surgery is planned. Female partners generally do not need to be treated. However, BV may spread between female sex partners so treatment is helpful in preventing a recurrence  of BV.   BV may be treated with antibiotics. The antibiotics come in either pill or vaginal cream forms. Either can be used with nonpregnant or pregnant women, but the recommended dosages differ. These antibiotics are not harmful to the baby.   BV can recur after treatment. If this happens, a second round of antibiotics will often be prescribed.   Treatment is important for pregnant women. If not treated, BV can cause a premature delivery, especially for a pregnant woman who had a premature birth in the past. All pregnant women who have symptoms of BV should be checked and treated.   For chronic reoccurrence of BV, treatment with a type of prescribed gel vaginally twice a week is helpful.  HOME CARE INSTRUCTIONS   Finish all medication as directed by your caregiver.   Do not have sex until treatment is completed.   Tell your sexual partner that you have a vaginal infection. They should see their caregiver and be treated if they have problems, such as a mild rash or itching.   Practice safe sex. Use condoms. Only have 1 sex partner.  PREVENTION  Basic prevention steps can help reduce the risk of upsetting the natural balance of bacteria in the vagina and developing BV:  Do not have sexual intercourse (be abstinent).   Do not douche.   Use all of the medicine prescribed for treatment of BV, even if the signs and symptoms go away.   Tell your sex partner if you have BV. That way, they can be treated, if needed, to prevent reoccurrence.  SEEK MEDICAL CARE IF:   Your symptoms are not improving after 3 days of treatment.   You have increased discharge, pain, or fever.  MAKE SURE YOU:   Understand these instructions.   Will watch your condition.   Will get help right away if you are not doing well or get worse.  FOR MORE INFORMATION  Division of STD Prevention (DSTDP), Centers for Disease Control and Prevention: [employer] American Social Health Association (ASHA):  www.ashastd.org  Document Released: 06/23/2005 Document Revised: 06/12/2011 Document Reviewed: 12/14/2008 [facility] Patient Information 2012 [place], [place].

## 2011-11-30 NOTE — ED Provider Notes (Signed)
History    36yf with vaginal bleeding. S/p c-section and tubal ligation end of February. Bleeding stopped since delivery initially but for past month or so has been having intermittent bleeding which has become much worse in past few days to about a week. Dark red blood and occasional clot. More than usual periods. Denies abdominal, back or pelvic pain. NO urinary complaints. No fever or chills. Denies use of blood thinning medication. No blood in stool. Dizziness yesterday. Resolved today. No CP or SOB. Has not seen ob/gyn yet for same.  CSN: [id number]  Arrival date & time 11/30/11  1449   First MD Initiated Contact with Patient 11/30/11 1559      Chief Complaint  Patient presents with  . Vaginal Bleeding  . Near Syncope  . Fatigue    (Consider location/radiation/quality/duration/timing/severity/associated sxs/prior treatment) HPI  Past Medical History  Diagnosis Date  . Kidney stones   . Opiate use     Past Surgical History  Procedure Date  . Cesarean section   . Therapeutic abortion   . No past surgeries   . Cesarean section     Family History  Problem Relation Age of Onset  . Diabetes Maternal Grandfather     History  Substance Use Topics  . Smoking status: Current Everyday Smoker -- 1.0 packs/day  . Smokeless tobacco: Never Used  . Alcohol Use: Yes     rarely    OB History    Grav Para Term Preterm Abortions TAB SAB Ect Mult Living   6 4 4  2 1 1   4       Review of Systems   Review of symptoms negative unless otherwise noted in HPI.   Allergies  Penicillins  Home Medications   Current Outpatient Rx  Name Route Sig Dispense Refill  . METHADONE HCL 10 MG/ML PO CONC Oral Take 120 mg by mouth daily.        BP 123/81  Pulse 61  Temp(Src) 97.8 F (36.6 C) (Oral)  Resp 18  Ht 5\' 11"  (1.803 m)  Wt 199 lb 4.7 oz (90.4 kg)  BMI 27.80 kg/m2  SpO2 100%  LMP 11/23/2011  Breastfeeding? No  Physical Exam  Nursing note and vitals  reviewed. Constitutional: She appears well-developed and well-nourished. No distress.  HENT:  Head: Normocephalic and atraumatic.  Eyes: Conjunctivae are normal. Right eye exhibits no discharge. Left eye exhibits no discharge.  Neck: Neck supple.  Cardiovascular: Normal rate, regular rhythm and normal heart sounds.  Exam reveals no gallop and no friction rub.   No murmur heard. Pulmonary/Chest: Effort normal and breath sounds normal. No respiratory distress.  Abdominal: Soft. She exhibits no distension. There is no tenderness. There is no rebound.       Well healed low transverse c-section scars  Genitourinary:       Moderate amount of dark red blood in vaginal vault. Blood noted at os. Os closed. No cmt. No adnexal mass or tenderness palpated.  Musculoskeletal: She exhibits no edema and no tenderness.  Neurological: She is alert.  Skin: Skin is warm and dry.  Psychiatric: She has a normal mood and affect. Her behavior is normal. Thought content normal.    ED Course  Procedures (including critical care time)  Labs Reviewed  URINALYSIS, ROUTINE W REFLEX MICROSCOPIC - Abnormal; Notable for the following:    APPearance CLOUDY (*)    Hgb urine dipstick LARGE (*)    Urobilinogen, UA 2.0 (*)    All other components  within normal limits  WET PREP, GENITAL - Abnormal; Notable for the following:    Clue Cells Wet Prep HPF POC FEW (*)    WBC, Wet Prep HPF POC FEW (*)    All other components within normal limits  URINE MICROSCOPIC-ADD ON - Abnormal; Notable for the following:    Bacteria, UA FEW (*)    All other components within normal limits  CBC  GC/CHLAMYDIA PROBE AMP, GENITAL  LAB REPORT - SCANNED   No results found.  EKG:  Rhythm: sinus brady Rate: 50 Axis: normal Intervals: normal ST segments: nonspecific ST changes. t wave flattening anteriorly   1. Vaginal bleeding   2. Bacterial vaginosis    MDM  36yF with vaginal bleeding. 3 months post-op from c-section/tubal  ligation. Moderate bleeding on pelvic exam from os. HD stable and H/H normal. Pt in need of GYN f/u but do not feel that further emergent testing/intervention indicated at this time. Return precautions discussed.       Raeford Razor, MD 12/03/11 1655

## 2011-12-01 LAB — GC/CHLAMYDIA PROBE AMP, GENITAL
Chlamydia, DNA Probe: NEGATIVE
GC Probe Amp, Genital: NEGATIVE

## 2012-05-20 ENCOUNTER — Ambulatory Visit (INDEPENDENT_AMBULATORY_CARE_PROVIDER_SITE_OTHER): Payer: Medicaid Other | Admitting: General Surgery

## 2012-05-20 ENCOUNTER — Encounter (INDEPENDENT_AMBULATORY_CARE_PROVIDER_SITE_OTHER): Payer: Self-pay | Admitting: General Surgery

## 2012-05-20 VITALS — BP 120/84 | HR 74 | Temp 97.8°F | Resp 16 | Ht 71.0 in | Wt 198.8 lb

## 2012-05-20 DIAGNOSIS — K429 Umbilical hernia without obstruction or gangrene: Secondary | ICD-10-CM

## 2012-05-20 NOTE — Patient Instructions (Signed)
You have an umbilical hernia. We have discussed surgical management of this. You have stated that you would like to go ahead and have this repaired now before it gets bigger.  Cigarette smoking significantly increases the risk of wound complications and infection. You are strongly urged to stop smoking immediately.  You will be scheduled for repair of umbilical hernia with mesh.      Umbilical Herniorrhaphy Herniorrhaphy is surgery to repair a hernia. A hernia is the protrusion of a part of an organ through an abdominal opening. An umbilical hernia means that your hernia is in the area around your navel. If the hernia is not repaired, the gap could get bigger. Your intestines or other tissues, such as fat, could get trapped in the gap. This can lead to other health problems, such as blocked intestines. If the hernia is fixed before problems set in, you may be allowed to go home the same day as the surgery (outpatient). LET YOUR CAREGIVER KNOW ABOUT:  Allergies to food or medicine.  Medicines taken, including vitamins, herbs, eyedrops, over-the-counter medicines, and creams.  Use of steroids (by mouth or creams).  Previous problems with anesthetics or numbing medicines.  History of bleeding problems or blood clots.  Previous surgery.  Other health problems, including diabetes and kidney problems.  Possibility of pregnancy, if this applies. RISKS AND COMPLICATIONS  Pain.  Excessive bleeding.  Hematoma. This is a pocket of blood that collects under the surgery site.  Infection at the surgery site.  Numbness at the surgery site.  Swelling and bruising.  Blood clots.  Intestinal damage (rare).  Scarring.  Skin damage.  Development of another hernia. This may require another surgery. BEFORE THE PROCEDURE  Ask your caregiver about changing or stopping your regular medicines. You may need to stop taking aspirin, nonsteroidal anti-inflammatory drugs (NSAIDs), vitamin E,  and blood thinners as early as 2 weeks before the procedure.  Do not eat or drink for 8 hours before the procedure, or as directed by your caregiver.  You might be asked to shower or wash with an antibacterial soap before the procedure.  Wear comfortable clothes that will be easy to put on after the procedure. PROCEDURE You will be given an intravenous (IV) tube. A needle will be inserted in your arm. Medicine will flow directly into your body through this needle. You might be given medicine to help you relax (sedative). You will be given medicine that numbs the area (local anesthetic) or medicine that makes you sleep (general anesthetic). If you have open surgery:  The surgeon will make a cut (incision) in your abdomen.  The gap in the muscle wall will be repaired. The surgeon may sew the edges together over the gap or use a mesh material to strengthen the area. When mesh is used, the body grows new, strong tissue into and around it. This new tissue closes the gap.  A drain might be put in to remove excess fluid from the body after surgery.  The surgeon will close the incision with stitches, glue, or staples. If you have laparoscopic surgery:  The surgeon will make several small incisions in your abdomen.  A thin, lighted tube (laparoscope) will be inserted into the abdomen through an incision. A camera is attached to the laparoscope that allows the surgeon to see inside the abdomen.  Tools will be inserted through the other incisions to repair the hernia. Usually, mesh is used to cover the gap.  The surgeon will close the  incisions with stitches. AFTER THE PROCEDURE  You will be taken to a recovery area. A nurse will watch and check your progress.  When you are awake, feeling well, and taking fluids well, you may be allowed to go home. In some cases, you may need to stay overnight in the hospital.  Arrange for someone to drive you home. Document Released: 09/19/2008 Document  Revised: 12/23/2011 Document Reviewed: 09/24/2011 [facility] Patient Information 2013 [place], [place].

## 2012-05-20 NOTE — Progress Notes (Signed)
Patient ID: Misty Young, female   DOB: 04-06-1976, 36 y.o.   MRN: [id number]  Chief Complaint  Patient presents with  . Umbilical Hernia    HPI Misty Young is a 36 y.o. female.  She is referred by Dr. Jeri Cos for evaluation and management of a umbilical hernia.  The patient has had 4 deliveries and 4 pregnancies. Each delivered by C-section. The hernia seems to be prominent during each pregnancy. It has not gotten much larger but is becoming more painful. She has no prior history of hernia or hernia surgery.  Comorbidities include tobacco abuse.  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 HPI  Past Medical History  Diagnosis Date  . Kidney stones   . Opiate use     Past Surgical History  Procedure Date  . Cesarean section 2002/2004/2009/2013    x4  . Therapeutic abortion   . No past surgeries     Family History  Problem Relation Age of Onset  . Diabetes Maternal Grandfather     Social History History  Substance Use Topics  . Smoking status: Current Every Day Smoker -- 1.0 packs/day  . Smokeless tobacco: Never Used  . Alcohol Use: Yes     Comment: rarely    Allergies  Allergen Reactions  . Penicillins Hives    Current Outpatient Prescriptions  Medication Sig Dispense Refill  . acetaminophen (TYLENOL) 500 MG tablet Take 1,000 mg by mouth every 4 (four) hours as needed.      Marland Kitchen ibuprofen (ADVIL,MOTRIN) 800 MG tablet Take 800 mg by mouth every 6 (six) hours as needed.        Review of Systems Review of Systems  Constitutional: Negative for fever, chills and unexpected weight change.  HENT: Negative for hearing loss, congestion, sore throat, trouble swallowing and voice change.   Eyes: Negative for visual disturbance.  Respiratory: Negative for cough and wheezing.   Cardiovascular: Negative for chest pain, palpitations  and leg swelling.  Gastrointestinal: Positive for abdominal pain. Negative for nausea, vomiting, diarrhea, constipation, blood in stool, abdominal distention and anal bleeding.  Genitourinary: Negative for hematuria, vaginal bleeding and difficulty urinating.  Musculoskeletal: Negative for arthralgias.  Skin: Negative for rash and wound.  Neurological: Negative for seizures, syncope and headaches.  Hematological: Negative for adenopathy. Does not bruise/bleed easily.  Psychiatric/Behavioral: Negative for confusion.    Blood pressure 120/84, pulse 74, temperature 97.8 F (36.6 C), resp. rate 16, height 5\' 11"  (1.803 m), weight 198 lb 12.8 oz (90.175 kg).  Physical Exam Physical Exam  Constitutional: She is oriented to person, place, and time. She appears well-developed and well-nourished. No distress.       Strong odor of tobacco.  HENT:  Head: Normocephalic and atraumatic.  Nose: Nose normal.  Mouth/Throat: No oropharyngeal exudate.  Eyes: Conjunctivae normal and EOM are normal. Pupils are equal, round, and reactive to light. Left eye exhibits no discharge. No scleral icterus.  Neck: Neck supple. No JVD present. No tracheal deviation present. No thyromegaly present.  Cardiovascular: Normal rate, regular rhythm, normal heart sounds and intact distal pulses.   No murmur heard. Pulmonary/Chest: Effort normal and breath sounds normal. No respiratory distress. She has no wheezes. She has no rales. She exhibits no tenderness.  Abdominal: Soft. Bowel sounds are normal. She exhibits no distension and no mass. There is no tenderness. There is no  rebound and no guarding.       Small umbilical hernia, defect less than 2 cm, protrudes but is reducible.  Genitourinary:       I do not detect inguinal hernia on supine exam.  Musculoskeletal: She exhibits no edema and no tenderness.  Lymphadenopathy:    She has no cervical adenopathy.  Neurological: She is alert and oriented to person, place, and  time. She exhibits normal muscle tone. Coordination normal.  Skin: Skin is warm. No rash noted. She is not diaphoretic. No erythema. No pallor.  Psychiatric: She has a normal mood and affect. Her behavior is normal. Judgment and thought content normal.    Data Reviewed No notes available  Assessment    Symptomatic umbilical hernia, reducible  Tobacco abuse  Multiparity with multiple C-sections    Plan    She would like to have this hernia repaired at this time, and so we will schedule her for that surgery.  I have discussed smoking cessation with her and strongly urged her to stop smoking because of his general health risk and also his adverse effect on wound healing.  Scheduled for open repair of umbilical hernia with mesh  I discussed the indications, details, techniques, and numerous risks of the surgery with her, including review of  patient information booklet and diagrams with her. She Understands these issues. Her questions were answered. She agrees with this plan.       Angelia Mould. Derrell Lolling, M.D., [facility] General and Minimally invasive Surgery Breast and Colorectal Surgery Office:   [phone] Pager:   [phone]  05/20/2012, 9:44 AM

## 2012-05-28 ENCOUNTER — Other Ambulatory Visit: Payer: Self-pay | Admitting: Nephrology

## 2012-05-28 ENCOUNTER — Ambulatory Visit
Admission: RE | Admit: 2012-05-28 | Discharge: 2012-05-28 | Disposition: A | Discharge status: Home / Self Care | Payer: Medicaid Other | Source: Ambulatory Visit | Attending: Nephrology | Admitting: Nephrology

## 2012-05-28 DIAGNOSIS — R52 Pain, unspecified: Secondary | ICD-10-CM

## 2012-06-09 ENCOUNTER — Encounter (HOSPITAL_COMMUNITY): Payer: Self-pay | Admitting: Pharmacy Technician

## 2012-06-11 ENCOUNTER — Encounter (HOSPITAL_COMMUNITY): Payer: Self-pay

## 2012-06-11 ENCOUNTER — Encounter (HOSPITAL_COMMUNITY)
Admission: RE | Admit: 2012-06-11 | Discharge: 2012-06-11 | Disposition: A | Discharge status: Home / Self Care | Payer: Medicaid Other | Source: Ambulatory Visit | Attending: General Surgery | Admitting: General Surgery

## 2012-06-11 LAB — CBC
HCT: 37.6 % (ref 36.0–46.0)
Hemoglobin: 13.5 g/dL (ref 12.0–15.0)
MCH: 32.5 pg (ref 26.0–34.0)
MCHC: 35.9 g/dL (ref 30.0–36.0)
MCV: 90.6 fL (ref 78.0–100.0)
Platelets: 276 10*3/uL (ref 150–400)
RBC: 4.15 MIL/uL (ref 3.87–5.11)
RDW: 12.2 % (ref 11.5–15.5)
WBC: 8.5 10*3/uL (ref 4.0–10.5)

## 2012-06-11 LAB — SURGICAL PCR SCREEN
MRSA, PCR: POSITIVE — AB
Staphylococcus aureus: POSITIVE — AB

## 2012-06-11 LAB — HCG, SERUM, QUALITATIVE: Preg, Serum: NEGATIVE

## 2012-06-11 NOTE — Patient Instructions (Signed)
20      Your procedure is scheduled on:  Tuesday 06/15/2012  Report to [facility] at 0530 AM.  Call this number if you have problems the morning of surgery: [phone]   Remember:   Do not eat food or drink liquids after midnight!  Take these medicines the morning of surgery with A SIP OF WATER: NONE   Do not bring valuables to the hospital.  .  Leave suitcase in the car. After surgery it may be brought to your room.  For patients admitted to the hospital, checkout time is 11:00 AM the day of              Discharge.    Special Instructions: See [facility] Preparing  For Surgery Instruction Sheet.Do not wear jewelry, lotions powders, perfumes. Women do not shave legs or underarms for 12 hours before showers. Contacts, partial plates,                 or dentures may not be worn into surgery.                          Patients discharged the day of surgery will not be allowed to drive home. If going home the same day of surgery, must have someone stay with you first 24 hrs.at home and arrange for someone to drive you home from the Hospital.                         YOUR DRIVER IS: Lawson Fiscal   Please read over the following fact sheets that you were given: MRSA INFORMATION,INCENTIVE SPIROMETRY SHEET, SLEEP APNEA SHEET                            Telford Nab.Nanney,RN,BSN     [phone]                FAILURE TO FOLLOW THESE INSTRUCTIONS MAY RESULT IN CANCELLATION OF YOUR SURGERY!               Patient Signature:___________________________

## 2012-06-13 NOTE — H&P (Signed)
Misty Young     MRN: [id number]   Description: 36 year old female  Provider: Ernestene Mention, MD  Department: [facility]       Diagnoses     Umbilical hernia   - Primary    553.1        Vitals -   BP Pulse Temp Resp Ht Wt    120/84 74 97.8 F (36.6 C) 16 5\' 11"  (1.803 m) 198 lb 12.8 oz (90.175 kg)    BMI - 27.73             History and Physical     Ernestene Mention, MD  Patient ID: Misty Young, female   DOB: [birth year], 36 y.o.   MRN: [id number]              HPI Misty Young is a 35 y.o. female.  She is referred by Dr. Jeri Cos for evaluation and management of a umbilical hernia.   The patient has had 4 deliveries and 4 pregnancies. Each delivered by C-section. The hernia seems to be prominent during each pregnancy. It has not gotten much larger but is becoming more painful. She has no prior history of hernia or hernia surgery.   Comorbidities include tobacco abuse.   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       Past Medical History   Diagnosis  Date   .  Kidney stones     .  Opiate use         Past Surgical History   Procedure  Date   .  Cesarean section  2002/2004/2009/2013       x4   .  Therapeutic abortion     .  No past surgeries         Family History   Problem  Relation  Age of Onset   .  Diabetes  Maternal Grandfather        Social History History   Substance Use Topics   .  Smoking status:  Current Every Day Smoker -- 1.0 packs/day   .  Smokeless tobacco:  Never Used   .  Alcohol Use:  Yes         Comment: rarely       Allergies   Allergen  Reactions   .  Penicillins  Hives       Current Outpatient Prescriptions   Medication  Sig  Dispense  Refill   .  acetaminophen (TYLENOL) 500 MG tablet  Take 1,000 mg by mouth every 4 (four) hours as needed.         Marland Kitchen   ibuprofen (ADVIL,MOTRIN) 800 MG tablet  Take 800 mg by mouth every 6 (six) hours as needed.            Review of Systems   Constitutional: Negative for fever, chills and unexpected weight change.  HENT: Negative for hearing loss, congestion, sore throat, trouble swallowing and voice change.   Eyes: Negative for visual disturbance.  Respiratory: Negative for cough and wheezing.   Cardiovascular: Negative for chest pain, palpitations and leg swelling.  Gastrointestinal: Positive for abdominal pain. Negative for nausea, vomiting, diarrhea, constipation, blood in stool, abdominal distention and anal bleeding.  Genitourinary: Negative for hematuria, vaginal bleeding and difficulty urinating.  Musculoskeletal: Negative for arthralgias.  Skin: Negative for rash and wound.  Neurological: Negative for seizures, syncope and headaches.  Hematological: Negative for adenopathy. Does not bruise/bleed easily.  Psychiatric/Behavioral: Negative for confusion.    Blood pressure 120/84, pulse 74, temperature 97.8 F (36.6 C), resp. rate 16, height 5\' 11"  (1.803 m), weight 198 lb 12.8 oz (90.175 kg).   Physical Exam   Constitutional: She is oriented to person, place, and time. She appears well-developed and well-nourished. No distress.       Strong odor of tobacco.  HENT:   Head: Normocephalic and atraumatic.   Nose: Nose normal.   Mouth/Throat: No oropharyngeal exudate.  Eyes: Conjunctivae normal and EOM are normal. Pupils are equal, round, and reactive to light. Left eye exhibits no discharge. No scleral icterus.  Neck: Neck supple. No JVD present. No tracheal deviation present. No thyromegaly present.  Cardiovascular: Normal rate, regular rhythm, normal heart sounds and intact distal pulses.    No murmur heard. Pulmonary/Chest: Effort normal and breath sounds normal. No respiratory distress. She has no wheezes. She has no rales. She exhibits no tenderness.  Abdominal: Soft. Bowel sounds are  normal. She exhibits no distension and no mass. There is no tenderness. There is no rebound and no guarding.       Small umbilical hernia, defect less than 2 cm, protrudes but is reducible.  Genitourinary:       I do not detect inguinal hernia on supine exam.  Musculoskeletal: She exhibits no edema and no tenderness.  Lymphadenopathy:    She has no cervical adenopathy.  Neurological: She is alert and oriented to person, place, and time. She exhibits normal muscle tone. Coordination normal.  Skin: Skin is warm. No rash noted. She is not diaphoretic. No erythema. No pallor.  Psychiatric: She has a normal mood and affect. Her behavior is normal. Judgment and thought content normal.       Assessment Symptomatic umbilical hernia, reducible   Tobacco abuse   Multiparity with multiple C-sections   Plan She would like to have this hernia repaired at this time, and so we will schedule her for that surgery.   I have discussed smoking cessation with her and strongly urged her to stop smoking because of his general health risk and also his adverse effect on wound healing.   Scheduled for open repair of umbilical hernia with mesh   I discussed the indications, details, techniques, and numerous risks of the surgery with her, including review of  patient information booklet and diagrams with her. She Understands these issues. Her questions were answered. She agrees with this plan.       Angelia Mould. Derrell Lolling, M.D., [facility] General and Minimally invasive Surgery Breast and Colorectal Surgery Office:   [phone] Pager:   [phone]

## 2012-06-15 ENCOUNTER — Encounter (HOSPITAL_COMMUNITY): Payer: Self-pay | Admitting: Anesthesiology

## 2012-06-15 ENCOUNTER — Encounter (HOSPITAL_COMMUNITY)
Admission: RE | Disposition: A | Discharge status: Home / Self Care | Payer: Self-pay | Source: Ambulatory Visit | Attending: General Surgery

## 2012-06-15 ENCOUNTER — Ambulatory Visit (HOSPITAL_COMMUNITY)
Admission: RE | Admit: 2012-06-15 | Discharge: 2012-06-15 | Disposition: A | Discharge status: Home / Self Care | Payer: Medicaid Other | Source: Ambulatory Visit | Attending: General Surgery | Admitting: General Surgery

## 2012-06-15 ENCOUNTER — Ambulatory Visit (HOSPITAL_COMMUNITY): Payer: Medicaid Other | Admitting: Anesthesiology

## 2012-06-15 ENCOUNTER — Encounter (HOSPITAL_COMMUNITY): Payer: Self-pay | Admitting: *Deleted

## 2012-06-15 DIAGNOSIS — K429 Umbilical hernia without obstruction or gangrene: Secondary | ICD-10-CM | POA: Diagnosis present

## 2012-06-15 DIAGNOSIS — Z01812 Encounter for preprocedural laboratory examination: Secondary | ICD-10-CM | POA: Insufficient documentation

## 2012-06-15 DIAGNOSIS — F17200 Nicotine dependence, unspecified, uncomplicated: Secondary | ICD-10-CM | POA: Insufficient documentation

## 2012-06-15 HISTORY — PX: INSERTION OF MESH: SHX5868

## 2012-06-15 HISTORY — PX: UMBILICAL HERNIA REPAIR: SHX196

## 2012-06-15 SURGERY — REPAIR, HERNIA, UMBILICAL, ADULT
Anesthesia: General | Site: Abdomen | Wound class: Clean

## 2012-06-15 MED ORDER — BUPIVACAINE-EPINEPHRINE PF 0.25-1:200000 % IJ SOLN
INTRAMUSCULAR | Status: AC
  Filled 2012-06-15: qty 30

## 2012-06-15 MED ORDER — ACETAMINOPHEN 10 MG/ML IV SOLN
INTRAVENOUS | Status: AC
  Filled 2012-06-15: qty 100

## 2012-06-15 MED ORDER — HYDROCODONE-ACETAMINOPHEN 5-325 MG PO TABS: 1.0000 | ORAL_TABLET | ORAL | Status: DC | PRN

## 2012-06-15 MED ORDER — FENTANYL CITRATE 0.05 MG/ML IJ SOLN: 50.0000 ug | INTRAMUSCULAR | Status: DC | PRN

## 2012-06-15 MED ORDER — NEOSTIGMINE METHYLSULFATE 1 MG/ML IJ SOLN
INTRAMUSCULAR | Status: DC | PRN
  Administered 2012-06-15: 4 mg via INTRAVENOUS

## 2012-06-15 MED ORDER — SODIUM CHLORIDE 0.9 % IV SOLN: 250.0000 mL | INTRAVENOUS | Status: DC | PRN

## 2012-06-15 MED ORDER — HYDROMORPHONE HCL PF 1 MG/ML IJ SOLN
INTRAMUSCULAR | Status: AC
  Filled 2012-06-15: qty 2

## 2012-06-15 MED ORDER — 0.9 % SODIUM CHLORIDE (POUR BTL) OPTIME
TOPICAL | Status: DC | PRN
  Administered 2012-06-15: 1000 mL

## 2012-06-15 MED ORDER — LIDOCAINE HCL (CARDIAC) 20 MG/ML IV SOLN
INTRAVENOUS | Status: DC | PRN
  Administered 2012-06-15: 50 mg via INTRAVENOUS

## 2012-06-15 MED ORDER — SODIUM CHLORIDE 0.9 % IV SOLN: INTRAVENOUS | Status: DC

## 2012-06-15 MED ORDER — LACTATED RINGERS IV SOLN
INTRAVENOUS | Status: DC | PRN
  Administered 2012-06-15: 07:00:00 via INTRAVENOUS

## 2012-06-15 MED ORDER — SODIUM CHLORIDE 0.9 % IJ SOLN: 3.0000 mL | INTRAMUSCULAR | Status: DC | PRN

## 2012-06-15 MED ORDER — CHLORHEXIDINE GLUCONATE 4 % EX LIQD
1.0000 "application " | Freq: Once | CUTANEOUS | Status: DC
  Filled 2012-06-15: qty 15

## 2012-06-15 MED ORDER — BUPIVACAINE-EPINEPHRINE PF 0.5-1:200000 % IJ SOLN
INTRAMUSCULAR | Status: DC | PRN
  Administered 2012-06-15: 10 mL
  Administered 2012-06-15: 8 mL

## 2012-06-15 MED ORDER — FENTANYL CITRATE 0.05 MG/ML IJ SOLN
INTRAMUSCULAR | Status: DC | PRN
  Administered 2012-06-15 (×2): 50 ug via INTRAVENOUS
  Administered 2012-06-15: 100 ug via INTRAVENOUS
  Administered 2012-06-15: 50 ug via INTRAVENOUS

## 2012-06-15 MED ORDER — HYDROMORPHONE HCL PF 1 MG/ML IJ SOLN
INTRAMUSCULAR | Status: DC | PRN
  Administered 2012-06-15 (×2): 0.5 mg via INTRAVENOUS
  Administered 2012-06-15: 1 mg via INTRAVENOUS

## 2012-06-15 MED ORDER — BUPIVACAINE-EPINEPHRINE (PF) 0.5% -1:200000 IJ SOLN
INTRAMUSCULAR | Status: AC
  Filled 2012-06-15: qty 10

## 2012-06-15 MED ORDER — LIDOCAINE HCL 1 % IJ SOLN
INTRAMUSCULAR | Status: AC
  Filled 2012-06-15: qty 20

## 2012-06-15 MED ORDER — ACETAMINOPHEN 325 MG PO TABS: 650.0000 mg | ORAL_TABLET | ORAL | Status: DC | PRN

## 2012-06-15 MED ORDER — PROPOFOL 10 MG/ML IV BOLUS
INTRAVENOUS | Status: DC | PRN
  Administered 2012-06-15: 150 mg via INTRAVENOUS

## 2012-06-15 MED ORDER — GLYCOPYRROLATE 0.2 MG/ML IJ SOLN
INTRAMUSCULAR | Status: DC | PRN
  Administered 2012-06-15: .7 mg via INTRAVENOUS

## 2012-06-15 MED ORDER — MIDAZOLAM HCL 5 MG/5ML IJ SOLN
INTRAMUSCULAR | Status: DC | PRN
  Administered 2012-06-15: 2 mg via INTRAVENOUS

## 2012-06-15 MED ORDER — KETOROLAC TROMETHAMINE 30 MG/ML IJ SOLN
30.0000 mg | Freq: Once | INTRAMUSCULAR | Status: AC
  Administered 2012-06-15: 30 mg via INTRAVENOUS

## 2012-06-15 MED ORDER — HYDROMORPHONE HCL PF 1 MG/ML IJ SOLN: INTRAMUSCULAR | Status: DC | PRN

## 2012-06-15 MED ORDER — HEPARIN SODIUM (PORCINE) 5000 UNIT/ML IJ SOLN
5000.0000 [IU] | Freq: Once | INTRAMUSCULAR | Status: AC
  Administered 2012-06-15: 5000 [IU] via SUBCUTANEOUS

## 2012-06-15 MED ORDER — OXYCODONE HCL 5 MG PO TABS
5.0000 mg | ORAL_TABLET | ORAL | Status: DC | PRN
  Administered 2012-06-15 (×2): 5 mg via ORAL
  Filled 2012-06-15 (×2): qty 1

## 2012-06-15 MED ORDER — ROCURONIUM BROMIDE 100 MG/10ML IV SOLN
INTRAVENOUS | Status: DC | PRN
  Administered 2012-06-15: 25 mg via INTRAVENOUS

## 2012-06-15 MED ORDER — ONDANSETRON HCL 4 MG/2ML IJ SOLN
INTRAMUSCULAR | Status: DC | PRN
  Administered 2012-06-15: 4 mg via INTRAVENOUS

## 2012-06-15 MED ORDER — LACTATED RINGERS IV SOLN: INTRAVENOUS | Status: DC

## 2012-06-15 MED ORDER — MORPHINE SULFATE 10 MG/ML IJ SOLN: 2.0000 mg | INTRAMUSCULAR | Status: DC | PRN

## 2012-06-15 MED ORDER — KETOROLAC TROMETHAMINE 30 MG/ML IJ SOLN
INTRAMUSCULAR | Status: AC
  Filled 2012-06-15: qty 1

## 2012-06-15 MED ORDER — DEXAMETHASONE SODIUM PHOSPHATE 10 MG/ML IJ SOLN
INTRAMUSCULAR | Status: DC | PRN
  Administered 2012-06-15: 10 mg via INTRAVENOUS

## 2012-06-15 MED ORDER — HYDROMORPHONE HCL PF 1 MG/ML IJ SOLN
0.2500 mg | INTRAMUSCULAR | Status: DC | PRN
  Administered 2012-06-15 (×4): 0.5 mg via INTRAVENOUS

## 2012-06-15 MED ORDER — CEFAZOLIN SODIUM-DEXTROSE 2-3 GM-% IV SOLR
INTRAVENOUS | Status: AC
  Filled 2012-06-15: qty 50

## 2012-06-15 MED ORDER — ACETAMINOPHEN 650 MG RE SUPP
650.0000 mg | RECTAL | Status: DC | PRN
  Filled 2012-06-15: qty 1

## 2012-06-15 MED ORDER — ACETAMINOPHEN 10 MG/ML IV SOLN
INTRAVENOUS | Status: DC | PRN
  Administered 2012-06-15: 1000 mg via INTRAVENOUS

## 2012-06-15 MED ORDER — ONDANSETRON HCL 4 MG/2ML IJ SOLN: 4.0000 mg | Freq: Four times a day (QID) | INTRAMUSCULAR | Status: DC | PRN

## 2012-06-15 MED ORDER — CEFAZOLIN SODIUM-DEXTROSE 2-3 GM-% IV SOLR
2.0000 g | INTRAVENOUS | Status: AC
  Administered 2012-06-15: 2 g via INTRAVENOUS

## 2012-06-15 MED ORDER — SODIUM CHLORIDE 0.9 % IJ SOLN: 3.0000 mL | Freq: Two times a day (BID) | INTRAMUSCULAR | Status: DC

## 2012-06-15 SURGICAL SUPPLY — 40 items
BENZOIN TINCTURE PRP APPL 2/3 (GAUZE/BANDAGES/DRESSINGS) IMPLANT
BLADE HEX COATED 2.75 (ELECTRODE) ×2 IMPLANT
BLADE SURG SZ10 CARB STEEL (BLADE) ×2 IMPLANT
CLOTH BEACON ORANGE TIMEOUT ST (SAFETY) ×2 IMPLANT
DECANTER SPIKE VIAL GLASS SM (MISCELLANEOUS) ×2 IMPLANT
DERMABOND ADVANCED (GAUZE/BANDAGES/DRESSINGS) ×1
DERMABOND ADVANCED .7 DNX12 (GAUZE/BANDAGES/DRESSINGS) ×1 IMPLANT
DRAPE LAPAROTOMY T 102X78X121 (DRAPES) ×2 IMPLANT
DRAPE POUCH INSTRU U-SHP 10X18 (DRAPES) ×2 IMPLANT
DRSG TEGADERM 4X4.75 (GAUZE/BANDAGES/DRESSINGS) ×2 IMPLANT
ELECT REM PT RETURN 9FT ADLT (ELECTROSURGICAL) ×2
ELECTRODE REM PT RTRN 9FT ADLT (ELECTROSURGICAL) ×1 IMPLANT
GLOVE BIOGEL PI IND STRL 7.0 (GLOVE) IMPLANT
GLOVE BIOGEL PI INDICATOR 7.0 (GLOVE)
GLOVE EUDERMIC 7 POWDERFREE (GLOVE) ×2 IMPLANT
GLOVE SS BIOGEL STRL SZ 7 (GLOVE) ×1 IMPLANT
GLOVE SUPERSENSE BIOGEL SZ 7 (GLOVE) ×1
GLOVE SURG SS PI 7.0 STRL IVOR (GLOVE) ×4 IMPLANT
GOWN STRL NON-REIN LRG LVL3 (GOWN DISPOSABLE) IMPLANT
GOWN STRL REIN XL XLG (GOWN DISPOSABLE) ×6 IMPLANT
KIT BASIN OR (CUSTOM PROCEDURE TRAY) ×2 IMPLANT
NEEDLE HYPO 22GX1.5 SAFETY (NEEDLE) ×2 IMPLANT
NS IRRIG 1000ML POUR BTL (IV SOLUTION) ×2 IMPLANT
PACK BASIC VI WITH GOWN DISP (CUSTOM PROCEDURE TRAY) ×2 IMPLANT
PATCH VENTRAL SMALL 4.3 (Mesh Specialty) ×2 IMPLANT
PENCIL BUTTON HOLSTER BLD 10FT (ELECTRODE) ×2 IMPLANT
SOL PREP POV-IOD 16OZ 10% (MISCELLANEOUS) IMPLANT
SPONGE GAUZE 4X4 12PLY (GAUZE/BANDAGES/DRESSINGS) ×2 IMPLANT
SPONGE LAP 4X18 X RAY DECT (DISPOSABLE) ×2 IMPLANT
STRIP CLOSURE SKIN 1/2X4 (GAUZE/BANDAGES/DRESSINGS) IMPLANT
SUT MNCRL AB 4-0 PS2 18 (SUTURE) ×2 IMPLANT
SUT NOVA NAB GS-21 0 18 T12 DT (SUTURE) ×4 IMPLANT
SUT PROLENE 0 CT 1 CR/8 (SUTURE) IMPLANT
SUT VIC AB 2-0 CT1 27 (SUTURE) ×1
SUT VIC AB 2-0 CT1 27XBRD (SUTURE) ×1 IMPLANT
SUT VIC AB 3-0 SH 8-18 (SUTURE) ×2 IMPLANT
SYR BULB IRRIGATION 50ML (SYRINGE) ×2 IMPLANT
SYR CONTROL 10ML LL (SYRINGE) ×2 IMPLANT
TOWEL OR 17X26 10 PK STRL BLUE (TOWEL DISPOSABLE) ×2 IMPLANT
TOWEL OR NON WOVEN STRL DISP B (DISPOSABLE) ×2 IMPLANT

## 2012-06-15 NOTE — Anesthesia Postprocedure Evaluation (Signed)
  Anesthesia Post-op Note  Patient: Misty Young  Procedure(s) Performed: Procedure(s) (LRB): HERNIA REPAIR UMBILICAL ADULT (N/A) INSERTION OF MESH (N/A)  Patient Location: PACU  Anesthesia Type: General  Level of Consciousness: awake and alert   Airway and Oxygen Therapy: Patient Spontanous Breathing  Post-op Pain: mild  Post-op Assessment: Post-op Vital signs reviewed, Patient's Cardiovascular Status Stable, Respiratory Function Stable, Patent Airway and No signs of Nausea or vomiting  Last Vitals:  Filed Vitals:   06/15/12 0930  BP: 107/68  Pulse: 56  Temp:   Resp: 8    Post-op Vital Signs: stable   Complications: No apparent anesthesia complications

## 2012-06-15 NOTE — Interval H&P Note (Signed)
History and Physical Interval Note:  06/15/2012 7:18 AM  Misty Young  has presented today for surgery, with the diagnosis of umbilical hernia  The goals and the various methods of treatment have been discussed with the patient and family. After consideration of risks, benefits and other options for treatment, the patient has consented to  Procedure(s) (LRB) with comments: HERNIA REPAIR UMBILICAL ADULT (N/A) INSERTION OF MESH (N/A) as a surgical intervention .  The patient's history has been reviewed, patient examined today, no change in status, stable for surgery.  I have reviewed the patient's chart and labs.  Questions were answered to the patient's satisfaction.     Ernestene Mention

## 2012-06-15 NOTE — Anesthesia Preprocedure Evaluation (Addendum)
Anesthesia Evaluation  Patient identified by MRN, date of birth, ID band Patient awake    Reviewed: Allergy & Precautions, H&P , NPO status , Patient's Chart, lab work & pertinent test results  Airway Mallampati: II TM Distance: >3 FB Neck ROM: full    Dental No notable dental hx. (+) Teeth Intact and Dental Advisory Given   Pulmonary neg pulmonary ROS,  breath sounds clear to auscultation  Pulmonary exam normal       Cardiovascular Exercise Tolerance: Good negative cardio ROS  Rhythm:regular Rate:Normal     Neuro/Psych negative neurological ROS  negative psych ROS   GI/Hepatic negative GI ROS, Neg liver ROS,   Endo/Other  negative endocrine ROS  Renal/GU negative Renal ROS  negative genitourinary   Musculoskeletal   Abdominal   Peds  Hematology negative hematology ROS (+)   Anesthesia Other Findings   Reproductive/Obstetrics negative OB ROS                           Anesthesia Physical Anesthesia Plan  ASA: I  Anesthesia Plan: General   Post-op Pain Management:    Induction: Intravenous  Airway Management Planned: Oral ETT  Additional Equipment:   Intra-op Plan:   Post-operative Plan: Extubation in OR  Informed Consent: I have reviewed the patients History and Physical, chart, labs and discussed the procedure including the risks, benefits and alternatives for the proposed anesthesia with the patient or authorized representative who has indicated his/her understanding and acceptance.   Dental Advisory Given  Plan Discussed with: CRNA and Surgeon  Anesthesia Plan Comments:         Anesthesia Quick Evaluation

## 2012-06-15 NOTE — Op Note (Signed)
Patient Name:           Misty Young   Date of Surgery:        06/15/2012  Pre op Diagnosis:      Umbilical hernia  Post op Diagnosis:    Umbilical hernia  Procedure:                 Repair of umbilical hernia with mesh  Surgeon:                     Angelia Mould. Derrell Lolling, M.D., FACS  Assistant:                      None  Operative Indications:   Misty Young is a 36 y.o. female. She is referred by Dr. Jeri Cos for evaluation and management of a umbilical hernia.  The patient has had 4 deliveries and 4 pregnancies. Each delivered by C-section. The hernia seems to be prominent during each pregnancy. It has not gotten much larger but is becoming more painful. She has no prior history of hernia or hernia surgery.  Comorbidities include tobacco abuse.  Examination reveals a reducible umbilical hernia, defect no more than 2 cm in diameter. She is brought to the operating room electively   Operative Findings:       There was a small umbilical hernia with a defect of less than 2 cm. The fascia of the entire abdominal wall was very thinned out. There were no other defects palpable.  Procedure in Detail:          Following the induction of general endotracheal anesthesia the patient's abdomen was prepped and draped in a sterile fashion. Surgical time out was performed. Intravenous antibiotics were given. 0.5% Marcaine with epinephrine was used as local infiltration anesthetic. A curved, transverse incision was made at the lower rim of the umbilicus. Dissection was carried down through the subcutaneous tissue exposing the fascia at the base of the umbilicus. The umbilicus was dissected away from the fascia exposing the defect. Subcutaneous tissue was dissected away from the anterior fascia of circumferentially. I explored the abdominal cavity with my finger. There was essentially no preperitoneal fat. I could see the colon. I felt roughly 10 cm in all directions and felt no defect. I brought a  4.3 cm diameter Proceed disc to the operative field and cut the tails off. I was very careful to position this so that the absorbable side was positioned toward the viscera and the rough side was positioned toward the abdominal wall. This was sutured in place with 4 interrupted mattress sutures of 0 Novofil. I placed all the sutures initially, then folded the mesh up and inserted into the abdominal cavity and then lifted up on the sutures and the mesh deployed and was positioned very nicely without any defect. I tied the  Novofil sutures down. I then palpated inside and it seems secure without any entrapment of intra-abdominal contents. I irrigated the wound. The fascia was sutured back together again on top of the mesh with a vest over pants technique with 0 Novofil. The umbilicus and subcutaneous tissues were closed back together with interrupted sutures of 3-0 Vicryl. The skin was closed with a running subcuticular suture of 4-0 Monocryl and Dermabond. Ice pack was placed. Patient was taken to recovery Stable condition. EBL 10 cc or less. Counts correct. Complications none. The patient required more narcotic and anesthetic and usual, and twice during the case  the patient developed abdominal tension and muscle tone placing stress on the repair. There was no evidence that any of the sutures broke during these events.     Angelia Mould. Derrell Lolling, M.D., FACS General and Minimally Invasive Surgery Breast and Colorectal Surgery  06/15/2012 8:32 AM

## 2012-06-15 NOTE — Transfer of Care (Signed)
Immediate Anesthesia Transfer of Care Note  Patient: Misty Young  Procedure(s) Performed: Procedure(s) (LRB): HERNIA REPAIR UMBILICAL ADULT (N/A) INSERTION OF MESH (N/A)  Patient Location: PACU  Anesthesia Type: General  Level of Consciousness: sedated, patient cooperative and responds to stimulaton  Airway & Oxygen Therapy: Patient Spontanous Breathing and Patient connected to face mask oxgen  Post-op Assessment: Report given to PACU RN and Post -op Vital signs reviewed and stable  Post vital signs: Reviewed and stable  Complications: No apparent anesthesia complications

## 2012-06-16 ENCOUNTER — Encounter (HOSPITAL_COMMUNITY): Payer: Self-pay | Admitting: General Surgery

## 2012-06-17 ENCOUNTER — Encounter (HOSPITAL_COMMUNITY): Payer: Self-pay

## 2012-06-17 ENCOUNTER — Other Ambulatory Visit (INDEPENDENT_AMBULATORY_CARE_PROVIDER_SITE_OTHER): Payer: Self-pay

## 2012-06-17 ENCOUNTER — Ambulatory Visit (HOSPITAL_COMMUNITY)
Admission: RE | Admit: 2012-06-17 | Discharge: 2012-06-17 | Disposition: A | Discharge status: Home / Self Care | Payer: Medicaid Other | Source: Ambulatory Visit | Attending: General Surgery | Admitting: General Surgery

## 2012-06-17 ENCOUNTER — Ambulatory Visit (INDEPENDENT_AMBULATORY_CARE_PROVIDER_SITE_OTHER): Payer: Medicaid Other | Admitting: General Surgery

## 2012-06-17 ENCOUNTER — Encounter (INDEPENDENT_AMBULATORY_CARE_PROVIDER_SITE_OTHER): Payer: Self-pay | Admitting: General Surgery

## 2012-06-17 ENCOUNTER — Telehealth (INDEPENDENT_AMBULATORY_CARE_PROVIDER_SITE_OTHER): Payer: Self-pay | Admitting: General Surgery

## 2012-06-17 VITALS — BP 134/70 | HR 75 | Temp 98.0°F | Resp 18 | Ht 71.0 in | Wt 202.0 lb

## 2012-06-17 DIAGNOSIS — R109 Unspecified abdominal pain: Secondary | ICD-10-CM

## 2012-06-17 LAB — CBC WITH DIFFERENTIAL/PLATELET
Basophils Absolute: 0.1 10*3/uL (ref 0.0–0.1)
Basophils Relative: 1 % (ref 0–1)
Eosinophils Absolute: 0.5 10*3/uL (ref 0.0–0.7)
Eosinophils Relative: 6 % — ABNORMAL HIGH (ref 0–5)
HCT: 36.4 % (ref 36.0–46.0)
Hemoglobin: 11.9 g/dL — ABNORMAL LOW (ref 12.0–15.0)
Lymphocytes Relative: 36 % (ref 12–46)
Lymphs Abs: 2.7 10*3/uL (ref 0.7–4.0)
MCH: 30.8 pg (ref 26.0–34.0)
MCHC: 32.7 g/dL (ref 30.0–36.0)
MCV: 94.3 fL (ref 78.0–100.0)
Monocytes Absolute: 0.4 10*3/uL (ref 0.1–1.0)
Monocytes Relative: 6 % (ref 3–12)
Neutro Abs: 3.8 10*3/uL (ref 1.7–7.7)
Neutrophils Relative %: 52 % (ref 43–77)
Platelets: 228 10*3/uL (ref 150–400)
RBC: 3.86 MIL/uL — ABNORMAL LOW (ref 3.87–5.11)
RDW: 12.9 % (ref 11.5–15.5)
WBC: 7.5 10*3/uL (ref 4.0–10.5)

## 2012-06-17 LAB — COMPREHENSIVE METABOLIC PANEL
ALT: 89 U/L — ABNORMAL HIGH (ref 0–35)
AST: 45 U/L — ABNORMAL HIGH (ref 0–37)
Albumin: 3.7 g/dL (ref 3.5–5.2)
Alkaline Phosphatase: 69 U/L (ref 39–117)
BUN: 11 mg/dL (ref 6–23)
CO2: 28 mEq/L (ref 19–32)
Calcium: 9 mg/dL (ref 8.4–10.5)
Chloride: 101 mEq/L (ref 96–112)
Creatinine, Ser: 0.98 mg/dL (ref 0.50–1.10)
GFR calc Af Amer: 86 mL/min — ABNORMAL LOW (ref >90)
GFR calc non Af Amer: 74 mL/min — ABNORMAL LOW (ref >90)
Glucose, Bld: 87 mg/dL (ref 70–99)
Potassium: 4.1 mEq/L (ref 3.5–5.1)
Sodium: 138 mEq/L (ref 135–145)
Total Bilirubin: 0.2 mg/dL — ABNORMAL LOW (ref 0.3–1.2)
Total Protein: 6.6 g/dL (ref 6.0–8.3)

## 2012-06-17 MED ORDER — HYDROMORPHONE HCL 4 MG PO TABS: 4.0000 mg | ORAL_TABLET | ORAL | Status: DC | PRN

## 2012-06-17 MED ORDER — IOHEXOL 300 MG/ML  SOLN
100.0000 mL | Freq: Once | INTRAMUSCULAR | Status: AC | PRN
  Administered 2012-06-17: 100 mL via INTRAVENOUS

## 2012-06-17 NOTE — Progress Notes (Signed)
Subjective:     Patient ID: Misty Young, female   DOB: 11-04-1975, 36 y.o.   MRN: [id number]  HPI This patient comes in today for evaluation for abdominal pain after her hernia surgery. She had a umbilical hernia repair with mesh 2 days ago by Dr. Derrell Lolling. Her pain was actually a little worse yesterday of birth is still very severe today. She says that her abdomen is soft up as well. She has not moved her bowels and but she is not nauseated and she has been tolerating food although she has not been eating much. She does not have any fevers or chills. She has been taking her Vicodin every 4 hours and does not provide much relief.  Upon further questioning, she says that when she is lying still her pain is okay but is worse with any movement.  Review of Systems     Objective:   Physical Exam .she is sitting up in a chair but does appear uncomfortable. A dilator on the bed of the examine her abdomen and she does not have any peritoneal signs. She does have tenderness focally around her umbilicus greatest on the right side at of the incision and just above and just below her incision. Her abdomen is not tympanitic and feels soft and again, there are certainly no peritoneal signs. She does not have any left-sided tenderness in the right side of her abdomen away from her incision is also nontender. Her incision is not show any erythema or drainage or sign of infection. She does have some bruising just above her incision at the base of the umbilicus. Dermabond is in place    Assessment:     Status post open umbilical hernia repair with mesh I think that this is all postoperative pain and I do not suspect any severe postoperative complications such as bowel injury or reherniation or incarceration. However, because she is having such severe pain and I cannot completely rule this out and especially going into the weekend, I have recommended some laboratory studies and ordered a CT scan of the abdomen for  further evaluation. I am going to send her to the hospital for these exams and have the results called to the on-call surgeon Dr. Andrey Campanile this evening. I have discussed the case with him and he will followup on these results. Also, in the event that these are negative and no further intervention is necessary, I have given her a prescription for Dilaudid 4 hopefully better pain control.     Plan:     We will check some laboratory studies and CT scan and she'll follow up with Dr. Derrell Lolling as scheduled if these exams are negative

## 2012-06-17 NOTE — Telephone Encounter (Signed)
Pt called to report she has uncontrolled postop pain since surgery Tues.  She has been taking 2 tabs Q4H with Motrin 4-5 tabs Q4-6H.  Strongly admonished her to take only 600 mg of Motrin Q6H or 800 mg Q8H but not more or she'll risk organ damage.  Pt is using an ice pack as well, but not having any luck with relief.  Please advise.

## 2012-06-17 NOTE — Telephone Encounter (Signed)
Per Dr. Derrell Lolling, pt scheduled for Urgent Office today, for uncontrolled postop pain .

## 2012-06-18 ENCOUNTER — Telehealth (INDEPENDENT_AMBULATORY_CARE_PROVIDER_SITE_OTHER): Payer: Self-pay | Admitting: General Surgery

## 2012-06-18 NOTE — Telephone Encounter (Signed)
I called the patient at home and she said that she is feeling a little better today.  It seems as though she is improving slightly each day.  I explained that we have surgeons on call all weekend if any increase in her symptoms to go to the ER or call us through our answering service.

## 2012-07-02 ENCOUNTER — Telehealth (INDEPENDENT_AMBULATORY_CARE_PROVIDER_SITE_OTHER): Payer: Self-pay

## 2012-07-02 NOTE — Telephone Encounter (Signed)
Due to case added at 12:30 appt needs to be moved. I LMOM for pt to call so we can give different time and possible date.

## 2012-07-05 ENCOUNTER — Encounter (INDEPENDENT_AMBULATORY_CARE_PROVIDER_SITE_OTHER): Payer: Medicaid Other | Admitting: General Surgery

## 2012-07-14 ENCOUNTER — Encounter (INDEPENDENT_AMBULATORY_CARE_PROVIDER_SITE_OTHER): Payer: Self-pay | Admitting: General Surgery

## 2012-08-05 ENCOUNTER — Emergency Department (HOSPITAL_COMMUNITY): Payer: Medicaid Other

## 2012-08-05 ENCOUNTER — Encounter (HOSPITAL_COMMUNITY): Payer: Self-pay

## 2012-08-05 ENCOUNTER — Emergency Department (HOSPITAL_COMMUNITY)
Admission: EM | Admit: 2012-08-05 | Discharge: 2012-08-05 | Disposition: A | Discharge status: Home / Self Care | Payer: Medicaid Other | Attending: Emergency Medicine | Admitting: Emergency Medicine

## 2012-08-05 DIAGNOSIS — N390 Urinary tract infection, site not specified: Secondary | ICD-10-CM | POA: Insufficient documentation

## 2012-08-05 DIAGNOSIS — Z87442 Personal history of urinary calculi: Secondary | ICD-10-CM | POA: Insufficient documentation

## 2012-08-05 DIAGNOSIS — Z3202 Encounter for pregnancy test, result negative: Secondary | ICD-10-CM | POA: Insufficient documentation

## 2012-08-05 DIAGNOSIS — R197 Diarrhea, unspecified: Secondary | ICD-10-CM | POA: Insufficient documentation

## 2012-08-05 DIAGNOSIS — Z9889 Other specified postprocedural states: Secondary | ICD-10-CM | POA: Insufficient documentation

## 2012-08-05 DIAGNOSIS — F17200 Nicotine dependence, unspecified, uncomplicated: Secondary | ICD-10-CM | POA: Insufficient documentation

## 2012-08-05 DIAGNOSIS — Z79899 Other long term (current) drug therapy: Secondary | ICD-10-CM | POA: Insufficient documentation

## 2012-08-05 DIAGNOSIS — R112 Nausea with vomiting, unspecified: Secondary | ICD-10-CM | POA: Insufficient documentation

## 2012-08-05 LAB — CBC WITH DIFFERENTIAL/PLATELET
Basophils Absolute: 0 10*3/uL (ref 0.0–0.1)
Basophils Relative: 0 % (ref 0–1)
Eosinophils Absolute: 0 10*3/uL (ref 0.0–0.7)
Eosinophils Relative: 0 % (ref 0–5)
HCT: 44.5 % (ref 36.0–46.0)
Hemoglobin: 15.5 g/dL — ABNORMAL HIGH (ref 12.0–15.0)
Lymphocytes Relative: 9 % — ABNORMAL LOW (ref 12–46)
Lymphs Abs: 1 10*3/uL (ref 0.7–4.0)
MCH: 32.5 pg (ref 26.0–34.0)
MCHC: 34.8 g/dL (ref 30.0–36.0)
MCV: 93.3 fL (ref 78.0–100.0)
Monocytes Absolute: 0.3 10*3/uL (ref 0.1–1.0)
Monocytes Relative: 3 % (ref 3–12)
Neutro Abs: 10.1 10*3/uL — ABNORMAL HIGH (ref 1.7–7.7)
Neutrophils Relative %: 89 % — ABNORMAL HIGH (ref 43–77)
Platelets: 373 10*3/uL (ref 150–400)
RBC: 4.77 MIL/uL (ref 3.87–5.11)
RDW: 12.7 % (ref 11.5–15.5)
WBC: 11.4 10*3/uL — ABNORMAL HIGH (ref 4.0–10.5)

## 2012-08-05 LAB — URINE MICROSCOPIC-ADD ON

## 2012-08-05 LAB — LIPASE, BLOOD: Lipase: 26 U/L (ref 11–59)

## 2012-08-05 LAB — URINALYSIS, ROUTINE W REFLEX MICROSCOPIC
Bilirubin Urine: NEGATIVE
Glucose, UA: NEGATIVE mg/dL
Hgb urine dipstick: NEGATIVE
Ketones, ur: >80 mg/dL — AB
Leukocytes, UA: NEGATIVE
Nitrite: POSITIVE — AB
Protein, ur: 30 mg/dL — AB
Specific Gravity, Urine: 1.029 (ref 1.005–1.030)
Urobilinogen, UA: 1 mg/dL (ref 0.0–1.0)
pH: 6.5 (ref 5.0–8.0)

## 2012-08-05 LAB — COMPREHENSIVE METABOLIC PANEL
ALT: 28 U/L (ref 0–35)
AST: 16 U/L (ref 0–37)
Albumin: 4.2 g/dL (ref 3.5–5.2)
Alkaline Phosphatase: 103 U/L (ref 39–117)
BUN: 18 mg/dL (ref 6–23)
CO2: 24 mEq/L (ref 19–32)
Calcium: 9.4 mg/dL (ref 8.4–10.5)
Chloride: 100 mEq/L (ref 96–112)
Creatinine, Ser: 0.77 mg/dL (ref 0.50–1.10)
GFR calc Af Amer: >90 mL/min (ref >90)
GFR calc non Af Amer: >90 mL/min (ref >90)
Glucose, Bld: 133 mg/dL — ABNORMAL HIGH (ref 70–99)
Potassium: 3.6 mEq/L (ref 3.5–5.1)
Sodium: 136 mEq/L (ref 135–145)
Total Bilirubin: 0.3 mg/dL (ref 0.3–1.2)
Total Protein: 7.8 g/dL (ref 6.0–8.3)

## 2012-08-05 LAB — PREGNANCY, URINE: Preg Test, Ur: NEGATIVE

## 2012-08-05 MED ORDER — PROMETHAZINE HCL 25 MG/ML IJ SOLN
25.0000 mg | Freq: Once | INTRAMUSCULAR | Status: AC
  Administered 2012-08-05: 25 mg via INTRAVENOUS
  Filled 2012-08-05: qty 1

## 2012-08-05 MED ORDER — TRAMADOL HCL 50 MG PO TABS: 50.0000 mg | ORAL_TABLET | Freq: Four times a day (QID) | ORAL | Status: DC | PRN

## 2012-08-05 MED ORDER — KETOROLAC TROMETHAMINE 30 MG/ML IJ SOLN
30.0000 mg | Freq: Once | INTRAMUSCULAR | Status: AC
  Administered 2012-08-05: 30 mg via INTRAVENOUS
  Filled 2012-08-05: qty 1

## 2012-08-05 MED ORDER — METOCLOPRAMIDE HCL 5 MG/ML IJ SOLN
10.0000 mg | Freq: Once | INTRAMUSCULAR | Status: AC
  Administered 2012-08-05: 10 mg via INTRAVENOUS
  Filled 2012-08-05: qty 2

## 2012-08-05 MED ORDER — SODIUM CHLORIDE 0.9 % IV SOLN
1000.0000 mL | Freq: Once | INTRAVENOUS | Status: AC
  Administered 2012-08-05: 1000 mL via INTRAVENOUS

## 2012-08-05 MED ORDER — METOCLOPRAMIDE HCL 10 MG PO TABS: 10.0000 mg | ORAL_TABLET | Freq: Four times a day (QID) | ORAL | Status: DC

## 2012-08-05 MED ORDER — DICYCLOMINE HCL 10 MG/ML IM SOLN
20.0000 mg | Freq: Once | INTRAMUSCULAR | Status: AC
  Administered 2012-08-05: 20 mg via INTRAMUSCULAR
  Filled 2012-08-05: qty 2

## 2012-08-05 MED ORDER — IOHEXOL 300 MG/ML  SOLN
50.0000 mL | Freq: Once | INTRAMUSCULAR | Status: AC | PRN
  Administered 2012-08-05: 50 mL via ORAL

## 2012-08-05 MED ORDER — IOHEXOL 300 MG/ML  SOLN
100.0000 mL | Freq: Once | INTRAMUSCULAR | Status: AC | PRN
  Administered 2012-08-05: 100 mL via INTRAVENOUS

## 2012-08-05 MED ORDER — NITROFURANTOIN MONOHYD MACRO 100 MG PO CAPS: 100.0000 mg | ORAL_CAPSULE | Freq: Two times a day (BID) | ORAL | Status: DC

## 2012-08-05 NOTE — ED Notes (Signed)
[id number]<AV> Expected date:<BR> Expected time:<BR> Means of arrival:<BR> Comments:<BR> EMS 61 abd pain n/v/d, orthostatic changes

## 2012-08-05 NOTE — ED Notes (Signed)
Pt notified that we need urine.  Sts unable to urinate at this time.

## 2012-08-05 NOTE — ED Notes (Addendum)
Per EMS, Pt, from home, c/o abdominal pain, nausea and vomiting x 2 days.  Pain score 8/10. Vitals are stable.  4mg  Zofran given in route.

## 2012-08-05 NOTE — ED Provider Notes (Signed)
History     CSN: [id number]  Arrival date & time 08/05/12  0821   First MD Initiated Contact with Patient 08/05/12 [phone]      Chief Complaint  Patient presents with  . Abdominal Pain  . Nausea  . Emesis     HPI  The patient presents with new abdominal pain, nausea, vomiting, diarrhea.  Symptoms began approximately 2 days ago.  Since onset symptoms have been progressive.  There is no clear precipitant.  The pain is diffuse, though focally worse in the periumbilical area.  There is minimal change with OTC medication.  The patient denies fever, dyspnea, chest pain.  Past Medical History  Diagnosis Date  . Opiate use   . Kidney stones     Past Surgical History  Procedure Date  . Cesarean section 2002/2004/2009/2013    x4  . Therapeutic abortion   . No past surgeries   . Umbilical hernia repair 06/15/2012    Procedure: HERNIA REPAIR UMBILICAL ADULT;  Surgeon: Ernestene Mention, MD;  Location: [facility];  Service: General;  Laterality: N/A;  . Insertion of mesh 06/15/2012    Procedure: INSERTION OF MESH;  Surgeon: Ernestene Mention, MD;  Location: [facility];  Service: General;  Laterality: N/A;    Family History  Problem Relation Age of Onset  . Diabetes Maternal Grandfather     History  Substance Use Topics  . Smoking status: Current Every Day Smoker -- 1.0 packs/day for 15 years  . Smokeless tobacco: Never Used  . Alcohol Use: Yes     Comment: rarely    OB History    Grav Para Term Preterm Abortions TAB SAB Ect Mult Living   6 4 4  2 1 1   4       Review of Systems  Constitutional:       Per HPI, otherwise negative  HENT:       Per HPI, otherwise negative  Eyes: Negative.   Respiratory:       Per HPI, otherwise negative  Cardiovascular:       Per HPI, otherwise negative  Gastrointestinal: Positive for nausea, vomiting, abdominal pain and diarrhea.  Genitourinary: Negative.   Musculoskeletal:       Per HPI, otherwise negative  Skin: Negative.   Neurological:  Negative for syncope.    Allergies  Penicillins  Home Medications   Current Outpatient Rx  Name  Route  Sig  Dispense  Refill  . ACETAMINOPHEN 500 MG PO TABS   Oral   Take 1,000 mg by mouth every 4 (four) hours as needed. For pain           BP 116/63  Pulse 106  Temp 98.3 F (36.8 C) (Oral)  Resp 16  SpO2 95%  Physical Exam  Nursing note and vitals reviewed. Constitutional: She is oriented to person, place, and time. She appears well-developed and well-nourished. No distress.  HENT:  Head: Normocephalic and atraumatic.  Eyes: Conjunctivae normal and EOM are normal.  Cardiovascular: Normal rate and regular rhythm.   Pulmonary/Chest: Effort normal and breath sounds normal. No stridor. No respiratory distress.  Abdominal: Soft. Normal appearance. She exhibits no distension. There is tenderness. There is guarding. There is no rigidity and no rebound.  Musculoskeletal: She exhibits no edema.  Neurological: She is alert and oriented to person, place, and time. No cranial nerve deficit.  Skin: Skin is warm and dry.       Scattered pock marks, scabs about the face and  habitus  Psychiatric: She has a normal mood and affect.    ED Course  Procedures (including critical care time)   Labs Reviewed  CBC WITH DIFFERENTIAL  COMPREHENSIVE METABOLIC PANEL  LIPASE, BLOOD  URINALYSIS, ROUTINE W REFLEX MICROSCOPIC  PREGNANCY, URINE   No results found.   No diagnosis found.  A review of the patient's chart following the initial encounter demonstrates both a Hx of drug use, and that her recent umbilical hernia repair was uncomplicated.  10:37 AM Patient still nauseous, vomiting.  Phenergan ordered  11:17 AM Patient c/o pain w nausea Given the lack of improvement, CT ordered  4:09 PM Patient seemingly comfortable throughout the majority of her ED stay, but refused to drink contrast.  CT scan did not demonstrate acute pathology.  MDM  This female with multiple medical  problems, including prior substance abuse now presents with ongoing abdominal pain.  On exam she is in discomfort, but not in distress.  The patient is tachycardic, but otherwise generally well-appearing.  She does have nitrites in her urine, will be treated for urinary tract infection.  She was advised to follow up with her primary care physician  Gerhard Munch, MD 08/05/12 1610

## 2012-08-07 LAB — URINE CULTURE: Colony Count: >=100000

## 2012-08-08 NOTE — ED Notes (Signed)
+  Urine. Patient treated with Macrobid. Sensitive to same. Per protocol MD. °

## 2012-08-11 ENCOUNTER — Encounter (HOSPITAL_COMMUNITY): Payer: Self-pay | Admitting: *Deleted

## 2012-08-11 ENCOUNTER — Emergency Department (HOSPITAL_COMMUNITY)
Admission: EM | Admit: 2012-08-11 | Discharge: 2012-08-11 | Disposition: A | Discharge status: Home / Self Care | Payer: Medicaid Other | Attending: Emergency Medicine | Admitting: Emergency Medicine

## 2012-08-11 DIAGNOSIS — R29818 Other symptoms and signs involving the nervous system: Secondary | ICD-10-CM | POA: Insufficient documentation

## 2012-08-11 DIAGNOSIS — Z9889 Other specified postprocedural states: Secondary | ICD-10-CM | POA: Insufficient documentation

## 2012-08-11 DIAGNOSIS — M549 Dorsalgia, unspecified: Secondary | ICD-10-CM

## 2012-08-11 DIAGNOSIS — R300 Dysuria: Secondary | ICD-10-CM | POA: Insufficient documentation

## 2012-08-11 DIAGNOSIS — F17200 Nicotine dependence, unspecified, uncomplicated: Secondary | ICD-10-CM | POA: Insufficient documentation

## 2012-08-11 DIAGNOSIS — M545 Low back pain, unspecified: Secondary | ICD-10-CM | POA: Insufficient documentation

## 2012-08-11 DIAGNOSIS — R350 Frequency of micturition: Secondary | ICD-10-CM | POA: Insufficient documentation

## 2012-08-11 DIAGNOSIS — Z79899 Other long term (current) drug therapy: Secondary | ICD-10-CM | POA: Insufficient documentation

## 2012-08-11 DIAGNOSIS — Z87442 Personal history of urinary calculi: Secondary | ICD-10-CM | POA: Insufficient documentation

## 2012-08-11 DIAGNOSIS — N898 Other specified noninflammatory disorders of vagina: Secondary | ICD-10-CM | POA: Insufficient documentation

## 2012-08-11 DIAGNOSIS — B9689 Other specified bacterial agents as the cause of diseases classified elsewhere: Secondary | ICD-10-CM

## 2012-08-11 DIAGNOSIS — F1110 Opioid abuse, uncomplicated: Secondary | ICD-10-CM | POA: Insufficient documentation

## 2012-08-11 DIAGNOSIS — Z3202 Encounter for pregnancy test, result negative: Secondary | ICD-10-CM | POA: Insufficient documentation

## 2012-08-11 DIAGNOSIS — N949 Unspecified condition associated with female genital organs and menstrual cycle: Secondary | ICD-10-CM

## 2012-08-11 DIAGNOSIS — N760 Acute vaginitis: Secondary | ICD-10-CM | POA: Insufficient documentation

## 2012-08-11 LAB — CBC WITH DIFFERENTIAL/PLATELET
Basophils Absolute: 0.1 10*3/uL (ref 0.0–0.1)
Basophils Relative: 1 % (ref 0–1)
Eosinophils Absolute: 0.2 10*3/uL (ref 0.0–0.7)
Eosinophils Relative: 2 % (ref 0–5)
HCT: 39.2 % (ref 36.0–46.0)
Hemoglobin: 13.3 g/dL (ref 12.0–15.0)
Lymphocytes Relative: 27 % (ref 12–46)
Lymphs Abs: 2 10*3/uL (ref 0.7–4.0)
MCH: 31.6 pg (ref 26.0–34.0)
MCHC: 33.9 g/dL (ref 30.0–36.0)
MCV: 93.1 fL (ref 78.0–100.0)
Monocytes Absolute: 0.3 10*3/uL (ref 0.1–1.0)
Monocytes Relative: 4 % (ref 3–12)
Neutro Abs: 5 10*3/uL (ref 1.7–7.7)
Neutrophils Relative %: 67 % (ref 43–77)
Platelets: 268 10*3/uL (ref 150–400)
RBC: 4.21 MIL/uL (ref 3.87–5.11)
RDW: 12.7 % (ref 11.5–15.5)
WBC: 7.6 10*3/uL (ref 4.0–10.5)

## 2012-08-11 LAB — WET PREP, GENITAL
Trich, Wet Prep: NONE SEEN
Yeast Wet Prep HPF POC: NONE SEEN

## 2012-08-11 LAB — URINALYSIS, ROUTINE W REFLEX MICROSCOPIC
Bilirubin Urine: NEGATIVE
Glucose, UA: NEGATIVE mg/dL
Hgb urine dipstick: NEGATIVE
Ketones, ur: NEGATIVE mg/dL
Leukocytes, UA: NEGATIVE
Nitrite: NEGATIVE
Protein, ur: NEGATIVE mg/dL
Specific Gravity, Urine: 1.01 (ref 1.005–1.030)
Urobilinogen, UA: 0.2 mg/dL (ref 0.0–1.0)
pH: 8.5 — ABNORMAL HIGH (ref 5.0–8.0)

## 2012-08-11 LAB — COMPREHENSIVE METABOLIC PANEL
ALT: 12 U/L (ref 0–35)
AST: 13 U/L (ref 0–37)
Albumin: 3.8 g/dL (ref 3.5–5.2)
Alkaline Phosphatase: 71 U/L (ref 39–117)
BUN: 10 mg/dL (ref 6–23)
CO2: 26 mEq/L (ref 19–32)
Calcium: 9.3 mg/dL (ref 8.4–10.5)
Chloride: 105 mEq/L (ref 96–112)
Creatinine, Ser: 0.71 mg/dL (ref 0.50–1.10)
GFR calc Af Amer: >90 mL/min (ref >90)
GFR calc non Af Amer: >90 mL/min (ref >90)
Glucose, Bld: 99 mg/dL (ref 70–99)
Potassium: 3.9 mEq/L (ref 3.5–5.1)
Sodium: 141 mEq/L (ref 135–145)
Total Bilirubin: 0.2 mg/dL — ABNORMAL LOW (ref 0.3–1.2)
Total Protein: 7 g/dL (ref 6.0–8.3)

## 2012-08-11 LAB — POCT PREGNANCY, URINE: Preg Test, Ur: NEGATIVE

## 2012-08-11 MED ORDER — METRONIDAZOLE 500 MG PO TABS
2000.0000 mg | ORAL_TABLET | Freq: Once | ORAL | Status: AC
  Administered 2012-08-11: 2000 mg via ORAL
  Filled 2012-08-11: qty 4

## 2012-08-11 MED ORDER — MORPHINE SULFATE 4 MG/ML IJ SOLN: 1.0000 mg | Freq: Once | INTRAMUSCULAR | Status: DC

## 2012-08-11 MED ORDER — CEFTRIAXONE SODIUM 250 MG IJ SOLR
250.0000 mg | Freq: Once | INTRAMUSCULAR | Status: AC
  Administered 2012-08-11: 250 mg via INTRAMUSCULAR
  Filled 2012-08-11: qty 250

## 2012-08-11 MED ORDER — LIDOCAINE HCL (PF) 1 % IJ SOLN
INTRAMUSCULAR | Status: AC
  Filled 2012-08-11: qty 5

## 2012-08-11 MED ORDER — OXYCODONE-ACETAMINOPHEN 5-325 MG PO TABS
1.0000 | ORAL_TABLET | Freq: Once | ORAL | Status: AC
  Administered 2012-08-11: 1 via ORAL
  Filled 2012-08-11: qty 1

## 2012-08-11 MED ORDER — AZITHROMYCIN 1 G PO PACK
1.0000 g | PACK | Freq: Once | ORAL | Status: AC
  Administered 2012-08-11: 1 g via ORAL
  Filled 2012-08-11: qty 1

## 2012-08-11 NOTE — ED Provider Notes (Addendum)
History     CSN: [id number]  Arrival date & time 08/11/12  1208   First MD Initiated Contact with Patient 08/11/12 1451      Chief Complaint  Patient presents with  . Back Pain  . Vaginal Discharge    (Consider location/radiation/quality/duration/timing/severity/associated sxs/prior treatment) HPI Comments: 37 y.o PMH kidney stones, umbilical hernia status post repair.  She presents to b/l low back pain, vaginal odor.  She presented to [facility] 1/30 diagnosed with UTI.  She tool Macrobid and completed course yesterday.  She also was given Reglan, Phenergan, Zofran, and Ultram.  Ultram did not seem to help her recent back pain.  Back pain is 8/10 b/l lower back.    SH: 4 kids  PCP: Dr. Bascom Levels   Patient is a 37 y.o. female presenting with back pain and vaginal discharge. The history is provided by the patient. No language interpreter was used.  Back Pain  This is a recurrent problem. The current episode started more than 1 week ago. The problem has been gradually worsening. The quality of the pain is described as aching. The pain is at a severity of 8/10. Associated symptoms include dysuria. Pertinent negatives include no fever.  Vaginal Discharge Pertinent negatives include no chills, fever, nausea or vomiting.    Past Medical History  Diagnosis Date  . Opiate use   . Kidney stones     Past Surgical History  Procedure Date  . Cesarean section 2002/2004/2009/2013    x4  . Therapeutic abortion   . No past surgeries   . Umbilical hernia repair 06/15/2012    Procedure: HERNIA REPAIR UMBILICAL ADULT;  Surgeon: Ernestene Mention, MD;  Location: [facility];  Service: General;  Laterality: N/A;  . Insertion of mesh 06/15/2012    Procedure: INSERTION OF MESH;  Surgeon: Ernestene Mention, MD;  Location: [facility];  Service: General;  Laterality: N/A;    Family History  Problem Relation Age of Onset  . Diabetes Maternal Grandfather     History  Substance Use Topics  . Smoking status:  Current Every Day Smoker -- 1.0 packs/day for 15 years  . Smokeless tobacco: Never Used  . Alcohol Use: Yes     Comment: rarely    OB History    Grav Para Term Preterm Abortions TAB SAB Ect Mult Living   6 4 4  2 1 1   4       Review of Systems  Constitutional: Negative for fever and chills.  Gastrointestinal: Negative for nausea and vomiting.  Genitourinary: Positive for dysuria and frequency. Negative for vaginal discharge.       Intermittent dysuria  Denies discharge  +vaginal odor  +sexually active w/o condoms    Musculoskeletal: Positive for back pain.  All other systems reviewed and are negative.    Allergies  Penicillins  Home Medications   Current Outpatient Rx  Name  Route  Sig  Dispense  Refill  . ACETAMINOPHEN 500 MG PO TABS   Oral   Take 1,000 mg by mouth every 4 (four) hours as needed. For pain         . METOCLOPRAMIDE HCL 10 MG PO TABS   Oral   Take 1 tablet (10 mg total) by mouth every 6 (six) hours.   20 tablet   0   . TRAMADOL HCL 50 MG PO TABS   Oral   Take 1 tablet (50 mg total) by mouth every 6 (six) hours as needed for pain.  15 tablet   0   . METOCLOPRAMIDE HCL 10 MG PO TABS   Oral   Take 1 tablet (10 mg total) by mouth every 6 (six) hours.   30 tablet   0     BP 104/69  Pulse 82  Temp 98.7 F (37.1 C) (Oral)  Resp 17  Ht 5\' 11"  (1.803 m)  Wt 199 lb (90.266 kg)  BMI 27.75 kg/m2  SpO2 96%  LMP 07/28/2012  Physical Exam  Nursing note and vitals reviewed. Constitutional: She is oriented to person, place, and time. She appears well-developed and well-nourished. She is cooperative. No distress.  HENT:  Head: Normocephalic and atraumatic.  Mouth/Throat: Oropharynx is clear and moist and mucous membranes are normal. No oropharyngeal exudate.  Eyes: Conjunctivae normal are normal. Pupils are equal, round, and reactive to light. Right eye exhibits no discharge. Left eye exhibits no discharge. No scleral icterus.   Cardiovascular: Regular rhythm, S1 normal, S2 normal and normal heart sounds.  Tachycardia present.   No murmur heard. Pulmonary/Chest: Effort normal and breath sounds normal. No respiratory distress. She has no wheezes.  Abdominal: Soft. Bowel sounds are normal. She exhibits no distension. There is tenderness in the right lower quadrant, suprapubic area and left lower quadrant. There is CVA tenderness.       Mild ttp RLQ, SPR, LLQ B/l CVA ttp, mild   Genitourinary: Pelvic exam was performed with patient supine. Cervix exhibits motion tenderness and discharge. Cervix exhibits no friability. Right adnexum displays no tenderness. Left adnexum displays tenderness. There is tenderness around the vagina. No bleeding around the vagina. Vaginal discharge found.       Thin clear discharge with fishy odor  Mild left adnexal ttp   Neurological: She is alert and oriented to person, place, and time. She has normal strength.  Skin: Skin is warm, dry and intact. No rash noted. She is not diaphoretic.       Tattoos to skin  Psychiatric: She has a normal mood and affect. Her speech is normal and behavior is normal. Judgment and thought content normal. Cognition and memory are normal.    ED Course  Procedures (including critical care time)  Labs Reviewed  URINALYSIS, ROUTINE W REFLEX MICROSCOPIC - Abnormal; Notable for the following:    pH 8.5 (*)     All other components within normal limits  COMPREHENSIVE METABOLIC PANEL - Abnormal; Notable for the following:    Total Bilirubin 0.2 (*)     All other components within normal limits  WET PREP, GENITAL - Abnormal; Notable for the following:    Clue Cells Wet Prep HPF POC FEW (*)     WBC, Wet Prep HPF POC FEW (*)     All other components within normal limits  CBC WITH DIFFERENTIAL  POCT PREGNANCY, URINE  GC/CHLAMYDIA PROBE AMP   No results found.   1. CMT (cervical motion tenderness)   2. Back pain   3. BV (bacterial vaginosis)       MDM   1. Vaginal odor -wet prep, GC -UA negative  -+CMT concern for PID given Rocephin 250 mg im x 1, AZM 1 g x 1 -pending GC studies  -wet prep with few clue cells Rx Flagyl 2 g x 1  -will d/c home   2. Back pain Given Percocet x 1  Try Tylenol or Ibuprofen prn at home  3. Dispo Home f/u with PCP Dr. Grover Canavan MD [phone]  Annett Gula, MD 08/11/12 1626  Annett Gula, MD 08/12/12 [phone]

## 2012-08-11 NOTE — ED Notes (Signed)
Pt states she developed vaginal d/c 3 days ago. Back has been painful since she has had a UTI. Pt states d/c is white with a foul odor.

## 2012-08-11 NOTE — ED Notes (Signed)
Pt was seen here last week and treated for a uti and finished anbx.  Now with lower back pain and vaginal discharge

## 2012-08-11 NOTE — ED Provider Notes (Signed)
I saw and evaluated the patient, reviewed the resident's note and I agree with the findings and plan.  Low back pain and abdominal pain x1 week. Seen 1/30 and treated for UTI and finished macrobid.  Abdomen soft and nontender. Paraspinal lumbar pain. 5/5 strength bilateral lower extremities. UA negative. Check pelvic.  Glynn Octave, MD 08/11/12 2037

## 2012-08-12 LAB — GC/CHLAMYDIA PROBE AMP
CT Probe RNA: NEGATIVE
GC Probe RNA: NEGATIVE

## 2012-08-12 NOTE — ED Provider Notes (Signed)
I saw and evaluated the patient, reviewed the resident's note and I agree with the findings and plan.  See my additional note.  Glynn Octave, MD 08/12/12 [phone]

## 2012-08-27 ENCOUNTER — Emergency Department (HOSPITAL_COMMUNITY)
Admission: EM | Admit: 2012-08-27 | Discharge: 2012-08-27 | Disposition: A | Discharge status: Home / Self Care | Payer: Medicaid Other | Attending: Emergency Medicine | Admitting: Emergency Medicine

## 2012-08-27 ENCOUNTER — Encounter (HOSPITAL_COMMUNITY): Payer: Self-pay | Admitting: Cardiology

## 2012-08-27 DIAGNOSIS — Z87442 Personal history of urinary calculi: Secondary | ICD-10-CM | POA: Insufficient documentation

## 2012-08-27 DIAGNOSIS — Z760 Encounter for issue of repeat prescription: Secondary | ICD-10-CM

## 2012-08-27 DIAGNOSIS — F17200 Nicotine dependence, unspecified, uncomplicated: Secondary | ICD-10-CM | POA: Insufficient documentation

## 2012-08-27 MED ORDER — AMPHETAMINE-DEXTROAMPHET ER 30 MG PO CP24: 30.0000 mg | ORAL_CAPSULE | ORAL | Status: DC

## 2012-08-27 NOTE — ED Notes (Signed)
Pt reports she was sent to have medication refilled.

## 2012-08-27 NOTE — ED Notes (Signed)
Here for refill of Adderall. Dr. Bascom Levels spoke with Dr. Patria Mane regarding this .

## 2012-08-27 NOTE — ED Provider Notes (Signed)
History     CSN: [id number]  Arrival date & time 08/27/12  1408   First MD Initiated Contact with Patient 08/27/12 1422      Chief Complaint  Patient presents with  . Medication Refill    (Consider location/radiation/quality/duration/timing/severity/associated sxs/prior treatment) HPI Comments: Patient is a 37 year old female who presents for medication refill of adderall. Patient referred here from Dr. Bascom Levels. Patient reports taking adderall and recently running out. Dr. Bascom Levels is unable to refill her medications. Patient denies any associated symptoms or complaints at this time. Patient reports taking 90mg  of adderall per day.    Past Medical History  Diagnosis Date  . Opiate use   . Kidney stones     Past Surgical History  Procedure Laterality Date  . Cesarean section  2002/2004/2009/2013    x4  . Therapeutic abortion    . No past surgeries    . Umbilical hernia repair  06/15/2012    Procedure: HERNIA REPAIR UMBILICAL ADULT;  Surgeon: Ernestene Mention, MD;  Location: [facility];  Service: General;  Laterality: N/A;  . Insertion of mesh  06/15/2012    Procedure: INSERTION OF MESH;  Surgeon: Ernestene Mention, MD;  Location: [facility];  Service: General;  Laterality: N/A;    Family History  Problem Relation Age of Onset  . Diabetes Maternal Grandfather     History  Substance Use Topics  . Smoking status: Current Every Day Smoker -- 1.00 packs/day for 15 years  . Smokeless tobacco: Never Used  . Alcohol Use: Yes     Comment: rarely    OB History   Grav Para Term Preterm Abortions TAB SAB Ect Mult Living   6 4 4  2 1 1   4       Review of Systems  Constitutional:       Medication refill  All other systems reviewed and are negative.    Allergies  Penicillins  Home Medications   Current Outpatient Rx  Name  Route  Sig  Dispense  Refill  . acetaminophen (TYLENOL) 500 MG tablet   Oral   Take 1,000 mg by mouth every 4 (four) hours as needed. For pain          . metoCLOPramide (REGLAN) 10 MG tablet   Oral   Take 1 tablet (10 mg total) by mouth every 6 (six) hours.   20 tablet   0   . traMADol (ULTRAM) 50 MG tablet   Oral   Take 1 tablet (50 mg total) by mouth every 6 (six) hours as needed for pain.   15 tablet   0     BP 125/82  Pulse 80  Temp(Src) 98.1 F (36.7 C) (Oral)  SpO2 100%  LMP 07/28/2012  Physical Exam  Nursing note and vitals reviewed. Constitutional: She is oriented to person, place, and time. She appears well-developed and well-nourished. No distress.  HENT:  Head: Normocephalic and atraumatic.  Eyes: Conjunctivae are normal.  Neck: Normal range of motion.  Cardiovascular: Normal rate and regular rhythm.  Exam reveals no gallop and no friction rub.   No murmur heard. Pulmonary/Chest: Effort normal and breath sounds normal. She has no wheezes. She has no rales. She exhibits no tenderness.  Abdominal: Soft. She exhibits no distension.  Musculoskeletal: Normal range of motion.  Neurological: She is alert and oriented to person, place, and time.  Speech is goal-oriented. Moves limbs without ataxia.   Skin: Skin is warm and dry.  Psychiatric: She  has a normal mood and affect. Her behavior is normal.    ED Course  Procedures (including critical care time)  Labs Reviewed - No data to display No results found.   1. Medication refill       MDM  2:30 PM Patient sent here by her PCP for medication refill because he lost his ability to prescribe controlled substances by the [facility] Medical Board. Patient's PCP spoke with Dr.Campos who spoke with Dr. Fonnie Jarvis about the situation. Dr. Patria Mane instructed me to write only for Adderal XR 30mg  for 2 weeks for the patient to be taken daily. Patient will have 15 day supply of adderal extended release to be taken daily. No further evaluation needed at this time.         Emilia Beck, PA-C 08/29/12 1556

## 2012-08-30 NOTE — ED Provider Notes (Signed)
Medical screening examination/treatment/procedure(s) were conducted as a shared visit with non-physician practitioner(s) and myself.  I personally evaluated the patient during the encounter  Very short course filled. 2 weeks. Spoke with pcp who verified meds. Will need a new pcp  Lyanne Co, MD 08/30/12 [phone]

## 2012-09-22 ENCOUNTER — Encounter (INDEPENDENT_AMBULATORY_CARE_PROVIDER_SITE_OTHER): Payer: Self-pay | Admitting: General Surgery

## 2012-09-22 ENCOUNTER — Telehealth (INDEPENDENT_AMBULATORY_CARE_PROVIDER_SITE_OTHER): Payer: Self-pay | Admitting: *Deleted

## 2012-09-22 ENCOUNTER — Ambulatory Visit (INDEPENDENT_AMBULATORY_CARE_PROVIDER_SITE_OTHER): Payer: Medicaid Other | Admitting: General Surgery

## 2012-09-22 ENCOUNTER — Other Ambulatory Visit (INDEPENDENT_AMBULATORY_CARE_PROVIDER_SITE_OTHER): Payer: Self-pay | Admitting: General Surgery

## 2012-09-22 VITALS — BP 110/74 | HR 100 | Temp 98.8°F | Resp 20 | Ht 71.0 in | Wt 187.6 lb

## 2012-09-22 DIAGNOSIS — K429 Umbilical hernia without obstruction or gangrene: Secondary | ICD-10-CM

## 2012-09-22 DIAGNOSIS — R109 Unspecified abdominal pain: Secondary | ICD-10-CM

## 2012-09-22 NOTE — Progress Notes (Signed)
Patient ID: Misty Young, female   DOB: 1975/11/22, 37 y.o.   MRN: [id number] The patient is a 36 year old female status post umbilical herniorrhaphy or by Dr. Amada Kingfisher 06/15/2012. Patient states that over the last several days she's had umbilical pain mainly on the superior and right side. She feels a firmness just superior to her umbilicus an feels this could be the mesh.  The base had no fevers on home no difficulty with eating, no difficulty with bowel function.  On exam: I do not feel any definite hernia on Valsalva. There is a firmness just inferior to the skin the superior portion of the umbilicus to be consistent with mesh.  There is no erythema or tenderness to palpation, no rebound, no guarding, no other peritoneal signs.  37 year old female status post umbilical hernia repair  We'll have the patient undergo CT scan her abdomen and pelvis to evaluate this area for possible recurrence versus migration of mesh.  We'll have patient followup at Dr. Derrell Lolling after the CT scan.

## 2012-09-22 NOTE — Telephone Encounter (Signed)
Patient called to state severe pain with "something trying to poke through" at incision site.  Patient wants to be evaluated by an MD for new issues.  Appt made for urgent office.

## 2012-09-27 ENCOUNTER — Ambulatory Visit
Admission: RE | Admit: 2012-09-27 | Discharge: 2012-09-27 | Disposition: A | Discharge status: Home / Self Care | Payer: Medicaid Other | Source: Ambulatory Visit | Attending: General Surgery | Admitting: General Surgery

## 2012-09-27 DIAGNOSIS — K429 Umbilical hernia without obstruction or gangrene: Secondary | ICD-10-CM

## 2012-09-27 MED ORDER — IOHEXOL 300 MG/ML  SOLN
100.0000 mL | Freq: Once | INTRAMUSCULAR | Status: AC | PRN
  Administered 2012-09-27: 100 mL via INTRAVENOUS

## 2012-09-29 ENCOUNTER — Telehealth (INDEPENDENT_AMBULATORY_CARE_PROVIDER_SITE_OTHER): Payer: Self-pay | Admitting: General Surgery

## 2012-09-29 NOTE — Telephone Encounter (Signed)
Pt called to ask for additional pain meds; appt to follow up with Dr. Derrell Lolling is next week.  She was seen by Dr. Derrell Lolling who gave her Percocet 5/325 mg and scheduled her to have a CT.  She states she is taking 3 pills a day and will run out today.  Discussed with pt that Dr. Derrell Lolling is not available today for refill, but will ask Urgent office MD to address this.  Please advise pt about refill:  [phone].

## 2012-09-29 NOTE — Telephone Encounter (Signed)
I spoke to Dr Derrell Lolling who reviewed Dr Derrell Lolling' note and the CT.  There is no explanation for her pain.  He recommends Advil or Aleve.  She can come to see him in the office and if she is still requiring Narcotics then he may refer her to a pain clinic.  The pt is aware.  I also notified her ct showed no evidence of recurrent hernia or new hernia.

## 2012-10-07 ENCOUNTER — Ambulatory Visit (INDEPENDENT_AMBULATORY_CARE_PROVIDER_SITE_OTHER): Payer: Medicaid Other | Admitting: General Surgery

## 2012-10-12 ENCOUNTER — Encounter (INDEPENDENT_AMBULATORY_CARE_PROVIDER_SITE_OTHER): Payer: Self-pay | Admitting: General Surgery

## 2012-11-22 ENCOUNTER — Emergency Department (HOSPITAL_COMMUNITY)
Admission: EM | Admit: 2012-11-22 | Discharge: 2012-11-22 | Disposition: A | Discharge status: Home / Self Care | Payer: Medicaid Other | Attending: Emergency Medicine | Admitting: Emergency Medicine

## 2012-11-22 ENCOUNTER — Encounter (HOSPITAL_COMMUNITY): Payer: Self-pay | Admitting: Emergency Medicine

## 2012-11-22 DIAGNOSIS — Z880 Allergy status to penicillin: Secondary | ICD-10-CM | POA: Insufficient documentation

## 2012-11-22 DIAGNOSIS — F17200 Nicotine dependence, unspecified, uncomplicated: Secondary | ICD-10-CM | POA: Insufficient documentation

## 2012-11-22 DIAGNOSIS — Z87442 Personal history of urinary calculi: Secondary | ICD-10-CM | POA: Insufficient documentation

## 2012-11-22 DIAGNOSIS — K645 Perianal venous thrombosis: Secondary | ICD-10-CM | POA: Insufficient documentation

## 2012-11-22 DIAGNOSIS — K625 Hemorrhage of anus and rectum: Secondary | ICD-10-CM | POA: Insufficient documentation

## 2012-11-22 DIAGNOSIS — K921 Melena: Secondary | ICD-10-CM | POA: Insufficient documentation

## 2012-11-22 MED ORDER — DOCUSATE SODIUM 100 MG PO CAPS: 100.0000 mg | ORAL_CAPSULE | Freq: Two times a day (BID) | ORAL | Status: DC

## 2012-11-22 MED ORDER — OXYCODONE-ACETAMINOPHEN 5-325 MG PO TABS: 2.0000 | ORAL_TABLET | ORAL | Status: DC | PRN

## 2012-11-22 MED ORDER — OXYCODONE-ACETAMINOPHEN 5-325 MG PO TABS
1.0000 | ORAL_TABLET | Freq: Once | ORAL | Status: AC
  Administered 2012-11-22: 1 via ORAL
  Filled 2012-11-22: qty 1

## 2012-11-22 NOTE — ED Notes (Signed)
Rectal pain x 2 days states hurts to sit has a hemmorid

## 2012-11-22 NOTE — ED Provider Notes (Signed)
History  This chart was scribed for Loren Racer, MD by Shari Heritage, ED Scribe. The patient was seen in room TR11C/TR11C. Patient's care was started at 1532.   CSN: [id number]  Arrival date & time 11/22/12  1353   First MD Initiated Contact with Patient 11/22/12 1532      Chief Complaint  Patient presents with  . Rectal Pain    Patient is a 37 y.o. female presenting with hematochezia. The history is provided by the patient. No language interpreter was used.  Rectal Bleeding  The current episode started 2 days ago. The problem occurs continuously. The problem has been gradually worsening. Pain severity now: moderate to severe. Associated symptoms include hemorrhoids and rectal pain. Pertinent negatives include no fever, no diarrhea, no nausea and no vomiting. Her past medical history does not include recent abdominal injury.     HPI Comments: Misty Young is a 37 y.o. female with history of hemorrhoids who presents to the Emergency Department complaining of worsening, constant, moderate to severe rectal pain accompanied by rectal bleeding onset 2 days ago. Patient attributes current symptoms to a hemorrhoid. Patient has never seen a GI specialist for this problem. There is no fever, chills, nausea, vomiting, diarrhea, or abdominal pain. Other medical history includes opiate use and kidney stones. She is a current every day smoker.    Past Medical History  Diagnosis Date  . Opiate use   . Kidney stones     Past Surgical History  Procedure Laterality Date  . Therapeutic abortion    . No past surgeries    . Umbilical hernia repair  06/15/2012    Procedure: HERNIA REPAIR UMBILICAL ADULT;  Surgeon: Ernestene Mention, MD;  Location: [facility];  Service: General;  Laterality: N/A;  . Insertion of mesh  06/15/2012    Procedure: INSERTION OF MESH;  Surgeon: Ernestene Mention, MD;  Location: [facility];  Service: General;  Laterality: N/A;  . Cesarean section  2002/2004/2009/2013    x4     Family History  Problem Relation Age of Onset  . Diabetes Maternal Grandfather     History  Substance Use Topics  . Smoking status: Current Every Day Smoker -- 1.00 packs/day for 15 years  . Smokeless tobacco: Never Used  . Alcohol Use: Yes     Comment: rarely    OB History   Grav Para Term Preterm Abortions TAB SAB Ect Mult Living   6 4 4  2 1 1   4       Review of Systems  Constitutional: Negative for fever and chills.  Gastrointestinal: Positive for hematochezia, rectal pain and hemorrhoids. Negative for nausea, vomiting and diarrhea.    Allergies  Penicillins  Home Medications   Current Outpatient Rx  Name  Route  Sig  Dispense  Refill  . traMADol (ULTRAM) 50 MG tablet   Oral   Take 50 mg by mouth every 6 (six) hours as needed for pain.         Marland Kitchen docusate sodium (COLACE) 100 MG capsule   Oral   Take 1 capsule (100 mg total) by mouth every 12 (twelve) hours.   60 capsule   0   . oxyCODONE-acetaminophen (PERCOCET) 5-325 MG per tablet   Oral   Take 2 tablets by mouth every 4 (four) hours as needed for pain.   20 tablet   0     Triage Vitals: BP 152/103  Pulse 94  Temp(Src) 98.5 F (36.9 C) (Oral)  Resp 16  Wt 186 lb 7 oz (84.567 kg)  BMI 26.01 kg/m2  SpO2 100%  Physical Exam  Constitutional: She is oriented to person, place, and time. She appears well-developed and well-nourished.  Appears uncomfortable.  HENT:  Head: Normocephalic and atraumatic.  Eyes: Conjunctivae and EOM are normal. Pupils are equal, round, and reactive to light.  Neck: Normal range of motion. Neck supple.  Pulmonary/Chest: No respiratory distress.  Abdominal: Soft. There is no tenderness.  Genitourinary:  Prolapsed, enlarged external hemorrhoids. One hemorrhoid is dark, discolored and tender to palpation. No active bleeding. Chaperone present.   Musculoskeletal: Normal range of motion.  Neurological: She is alert and oriented to person, place, and time.  Skin: Skin  is warm and dry.    ED Course  Thrombectomy of External Hemorrhoid Date/Time: 11/22/2012 4:58 PM Performed by: Loren Racer Authorized by: Ranae Palms, DAVID Consent: Verbal consent obtained. Risks and benefits: risks, benefits and alternatives were discussed Consent given by: patient Patient understanding: patient states understanding of the procedure being performed Patient consent: the patient's understanding of the procedure matches consent given Procedure consent: procedure consent matches procedure scheduled Relevant documents: relevant documents present and verified Test results: test results available and properly labeled Required items: required blood products, implants, devices, and special equipment available Patient identity confirmed: verbally with patient Time out: Immediately prior to procedure a "time out" was called to verify the correct patient, procedure, equipment, support staff and site/side marked as required. Preparation: Patient was prepped and draped in the usual sterile fashion. Local anesthesia used: yes Local anesthetic: lidocaine 2% with epinephrine Anesthetic total: 5 ml Patient sedated: no Patient tolerance: Patient tolerated the procedure well with no immediate complications. Comments: 1 cm ellpitpical incision made with small amount of clot burden removed. Applied antibiotic appointment and bandaged.   DIAGNOSTIC STUDIES: Oxygen Saturation is 100% on room air, normal by my interpretation.    COORDINATION OF CARE: 3:42 PM- Patient informed of current plan for treatment and evaluation and agrees with plan at this time.   4:58 PM- Thrombectomy of hemorrhoid performed with small clot burden removed. Patient tolerated procedure well. Will discharge prescriptions for Percocet 5-325 mg and Colace 100 mg.   1. Thrombosed external hemorrhoid       MDM  I personally performed the services described in this documentation, which was scribed in my  presence. The recorded information has been reviewed and is accurate.    Loren Racer, MD 11/22/12 [phone]

## 2013-01-14 ENCOUNTER — Emergency Department (HOSPITAL_COMMUNITY)
Admission: EM | Admit: 2013-01-14 | Discharge: 2013-01-14 | Disposition: A | Discharge status: Home / Self Care | Payer: Medicaid Other | Attending: Emergency Medicine | Admitting: Emergency Medicine

## 2013-01-14 ENCOUNTER — Emergency Department (HOSPITAL_COMMUNITY): Payer: Medicaid Other

## 2013-01-14 ENCOUNTER — Encounter (HOSPITAL_COMMUNITY): Payer: Self-pay | Admitting: Emergency Medicine

## 2013-01-14 DIAGNOSIS — S199XXA Unspecified injury of neck, initial encounter: Secondary | ICD-10-CM | POA: Insufficient documentation

## 2013-01-14 DIAGNOSIS — S4980XA Other specified injuries of shoulder and upper arm, unspecified arm, initial encounter: Secondary | ICD-10-CM | POA: Insufficient documentation

## 2013-01-14 DIAGNOSIS — S0993XA Unspecified injury of face, initial encounter: Secondary | ICD-10-CM | POA: Insufficient documentation

## 2013-01-14 DIAGNOSIS — Z8659 Personal history of other mental and behavioral disorders: Secondary | ICD-10-CM | POA: Insufficient documentation

## 2013-01-14 DIAGNOSIS — S46909A Unspecified injury of unspecified muscle, fascia and tendon at shoulder and upper arm level, unspecified arm, initial encounter: Secondary | ICD-10-CM | POA: Insufficient documentation

## 2013-01-14 DIAGNOSIS — Z880 Allergy status to penicillin: Secondary | ICD-10-CM | POA: Insufficient documentation

## 2013-01-14 DIAGNOSIS — Y92410 Unspecified street and highway as the place of occurrence of the external cause: Secondary | ICD-10-CM | POA: Insufficient documentation

## 2013-01-14 DIAGNOSIS — Z87442 Personal history of urinary calculi: Secondary | ICD-10-CM | POA: Insufficient documentation

## 2013-01-14 DIAGNOSIS — F17200 Nicotine dependence, unspecified, uncomplicated: Secondary | ICD-10-CM | POA: Insufficient documentation

## 2013-01-14 DIAGNOSIS — Y9389 Activity, other specified: Secondary | ICD-10-CM | POA: Insufficient documentation

## 2013-01-14 DIAGNOSIS — IMO0002 Reserved for concepts with insufficient information to code with codable children: Secondary | ICD-10-CM | POA: Insufficient documentation

## 2013-01-14 MED ORDER — KETOROLAC TROMETHAMINE 30 MG/ML IJ SOLN
30.0000 mg | Freq: Once | INTRAMUSCULAR | Status: AC
  Administered 2013-01-14: 30 mg via INTRAVENOUS
  Filled 2013-01-14: qty 1

## 2013-01-14 MED ORDER — HYDROCODONE-ACETAMINOPHEN 5-325 MG PO TABS: 1.0000 | ORAL_TABLET | Freq: Four times a day (QID) | ORAL | Status: DC | PRN

## 2013-01-14 MED ORDER — METHOCARBAMOL 500 MG PO TABS: 500.0000 mg | ORAL_TABLET | Freq: Two times a day (BID) | ORAL | Status: DC

## 2013-01-14 MED ORDER — LORAZEPAM 2 MG/ML IJ SOLN
1.0000 mg | Freq: Once | INTRAMUSCULAR | Status: AC
Start: 2013-01-14 — End: 2013-01-14
  Administered 2013-01-14: 1 mg via INTRAVENOUS
  Filled 2013-01-14: qty 1

## 2013-01-14 MED ORDER — OXYCODONE-ACETAMINOPHEN 5-325 MG PO TABS
2.0000 | ORAL_TABLET | Freq: Once | ORAL | Status: AC
  Administered 2013-01-14: 2 via ORAL
  Filled 2013-01-14: qty 2

## 2013-01-14 MED ORDER — NAPROXEN 500 MG PO TABS: 500.0000 mg | ORAL_TABLET | Freq: Two times a day (BID) | ORAL | Status: DC
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
## 2013-01-14 NOTE — ED Notes (Signed)
Heather, PA at bedside. 

## 2013-01-14 NOTE — ED Notes (Signed)
Pt. Given bus pass.

## 2013-01-15 NOTE — ED Provider Notes (Signed)
Medical screening examination/treatment/procedure(s) were performed by non-physician practitioner and as supervising physician I was immediately available for consultation/collaboration.   Glynn Octave, MD 01/15/13 [phone]

## 2013-07-17 ENCOUNTER — Emergency Department (HOSPITAL_COMMUNITY): Payer: Medicaid Other

## 2013-07-17 ENCOUNTER — Emergency Department (HOSPITAL_COMMUNITY)
Admission: EM | Admit: 2013-07-17 | Discharge: 2013-07-17 | Disposition: A | Discharge status: Home / Self Care | Payer: Medicaid Other | Attending: Emergency Medicine | Admitting: Emergency Medicine

## 2013-07-17 ENCOUNTER — Encounter (HOSPITAL_COMMUNITY): Payer: Self-pay | Admitting: Emergency Medicine

## 2013-07-17 DIAGNOSIS — A5901 Trichomonal vulvovaginitis: Secondary | ICD-10-CM | POA: Insufficient documentation

## 2013-07-17 DIAGNOSIS — IMO0001 Reserved for inherently not codable concepts without codable children: Secondary | ICD-10-CM | POA: Insufficient documentation

## 2013-07-17 DIAGNOSIS — R000 Tachycardia, unspecified: Secondary | ICD-10-CM | POA: Insufficient documentation

## 2013-07-17 DIAGNOSIS — N760 Acute vaginitis: Secondary | ICD-10-CM | POA: Insufficient documentation

## 2013-07-17 DIAGNOSIS — R110 Nausea: Secondary | ICD-10-CM | POA: Insufficient documentation

## 2013-07-17 DIAGNOSIS — F17200 Nicotine dependence, unspecified, uncomplicated: Secondary | ICD-10-CM | POA: Insufficient documentation

## 2013-07-17 DIAGNOSIS — Z79899 Other long term (current) drug therapy: Secondary | ICD-10-CM | POA: Insufficient documentation

## 2013-07-17 DIAGNOSIS — Z87442 Personal history of urinary calculi: Secondary | ICD-10-CM | POA: Insufficient documentation

## 2013-07-17 DIAGNOSIS — M549 Dorsalgia, unspecified: Secondary | ICD-10-CM | POA: Insufficient documentation

## 2013-07-17 DIAGNOSIS — J069 Acute upper respiratory infection, unspecified: Secondary | ICD-10-CM | POA: Insufficient documentation

## 2013-07-17 DIAGNOSIS — Z3202 Encounter for pregnancy test, result negative: Secondary | ICD-10-CM | POA: Insufficient documentation

## 2013-07-17 DIAGNOSIS — B9689 Other specified bacterial agents as the cause of diseases classified elsewhere: Secondary | ICD-10-CM | POA: Insufficient documentation

## 2013-07-17 DIAGNOSIS — A499 Bacterial infection, unspecified: Secondary | ICD-10-CM | POA: Insufficient documentation

## 2013-07-17 DIAGNOSIS — A599 Trichomoniasis, unspecified: Secondary | ICD-10-CM

## 2013-07-17 DIAGNOSIS — Z880 Allergy status to penicillin: Secondary | ICD-10-CM | POA: Insufficient documentation

## 2013-07-17 DIAGNOSIS — R079 Chest pain, unspecified: Secondary | ICD-10-CM | POA: Insufficient documentation

## 2013-07-17 LAB — WET PREP, GENITAL: Yeast Wet Prep HPF POC: NONE SEEN

## 2013-07-17 LAB — URINALYSIS, ROUTINE W REFLEX MICROSCOPIC
Glucose, UA: NEGATIVE mg/dL
Hgb urine dipstick: NEGATIVE
Ketones, ur: NEGATIVE mg/dL
Nitrite: POSITIVE — AB
Protein, ur: NEGATIVE mg/dL
Specific Gravity, Urine: 1.015 (ref 1.005–1.030)
Urobilinogen, UA: 0.2 mg/dL (ref 0.0–1.0)
pH: 7 (ref 5.0–8.0)

## 2013-07-17 LAB — URINE MICROSCOPIC-ADD ON

## 2013-07-17 LAB — PREGNANCY, URINE: Preg Test, Ur: NEGATIVE

## 2013-07-17 MED ORDER — ALBUTEROL SULFATE HFA 108 (90 BASE) MCG/ACT IN AERS
2.0000 | INHALATION_SPRAY | RESPIRATORY_TRACT | Status: DC | PRN
  Administered 2013-07-17: 2 via RESPIRATORY_TRACT
  Filled 2013-07-17: qty 6.7

## 2013-07-17 MED ORDER — METRONIDAZOLE 500 MG PO TABS
500.0000 mg | ORAL_TABLET | Freq: Two times a day (BID) | ORAL | Status: DC
Start: 2013-07-17 — End: 2013-07-29

## 2013-07-17 MED ORDER — PREDNISONE 20 MG PO TABS
60.0000 mg | ORAL_TABLET | Freq: Every day | ORAL | Status: DC
Start: 2013-07-17 — End: 2013-07-29

## 2013-07-17 MED ORDER — OXYCODONE-ACETAMINOPHEN 5-325 MG PO TABS: 1.0000 | ORAL_TABLET | Freq: Four times a day (QID) | ORAL | Status: DC | PRN

## 2013-07-17 MED ORDER — PROMETHAZINE HCL 25 MG PO TABS
25.0000 mg | ORAL_TABLET | Freq: Once | ORAL | Status: AC
  Administered 2013-07-17: 25 mg via ORAL
  Filled 2013-07-17: qty 1

## 2013-07-17 NOTE — Discharge Instructions (Signed)
Trichomoniasis Trichomoniasis is an infection, caused by the Trichomonas organism, that affects both women and men. In women, the outer female genitalia and the vagina are affected. In men, the penis is mainly affected, but the prostate and other reproductive organs can also be involved. Trichomoniasis is a sexually transmitted disease (STD) and is most often passed to another person through sexual contact. The majority of people who get trichomoniasis do so from a sexual encounter and are also at risk for other STDs. CAUSES   Sexual intercourse with an infected partner.  It can be present in swimming pools or hot tubs. SYMPTOMS   Abnormal gray-green frothy vaginal discharge in women.  Vaginal itching and irritation in women.  Itching and irritation of the area outside the vagina in women.  Penile discharge with or without pain in males.  Inflammation of the urethra (urethritis), causing painful urination.  Bleeding after sexual intercourse. RELATED COMPLICATIONS  Pelvic inflammatory disease.  Infection of the uterus (endometritis).  Infertility.  Tubal (ectopic) pregnancy.  It can be associated with other STDs, including gonorrhea and chlamydia, hepatitis B, and HIV. COMPLICATIONS DURING PREGNANCY  Early (premature) delivery.  Premature rupture of the membranes (PROM).  Low birth weight. DIAGNOSIS   Visualization of Trichomonas under the microscope from the vagina discharge.  Ph of the vagina greater than 4.5, tested with a test tape.  Trich Rapid Test.  Culture of the organism, but this is not usually needed.  It may be found on a Pap test.  Having a "strawberry cervix,"which means the cervix looks very red like a strawberry. TREATMENT   You may be given medication to fight the infection. Inform your caregiver if you could be or are pregnant. Some medications used to treat the infection should not be taken during pregnancy.  Over-the-counter medications or  creams to decrease itching or irritation may be recommended.  Your sexual partner will need to be treated if infected. HOME CARE INSTRUCTIONS   Take all medication prescribed by your caregiver.  Take over-the-counter medication for itching or irritation as directed by your caregiver.  Do not have sexual intercourse while you have the infection.  Do not douche or wear tampons.  Discuss your infection with your partner, as your partner may have acquired the infection from you. Or, your partner may have been the person who transmitted the infection to you.  Have your sex partner examined and treated if necessary.  Practice safe, informed, and protected sex.  See your caregiver for other STD testing. SEEK MEDICAL CARE IF:   You still have symptoms after you finish the medication.  You have an oral temperature above 102 F (38.9 C).  You develop belly (abdominal) pain.  You have pain when you urinate.  You have bleeding after sexual intercourse.  You develop a rash.  The medication makes you sick or makes you throw up (vomit). Document Released: 12/17/2000 Document Revised: 09/15/2011 Document Reviewed: 01/12/2009 [facility] Patient Information 2014 [employer] [place].  Upper Respiratory Infection, Adult An upper respiratory infection (URI) is also sometimes known as the common cold. The upper respiratory tract includes the nose, sinuses, throat, trachea, and bronchi. Bronchi are the airways leading to the lungs. Most people improve within 1 week, but symptoms can last up to 2 weeks. A residual cough may last even longer.  CAUSES Many different viruses can infect the tissues lining the upper respiratory tract. The tissues become irritated and inflamed and often become very moist. Mucus production is also common. A cold  is contagious. You can easily spread the virus to others by oral contact. This includes kissing, sharing a glass, coughing, or sneezing. Touching your mouth or nose and  then touching a surface, which is then touched by another person, can also spread the virus. SYMPTOMS  Symptoms typically develop 1 to 3 days after you come in contact with a cold virus. Symptoms vary from person to person. They may include:  Runny nose.  Sneezing.  Nasal congestion.  Sinus irritation.  Sore throat.  Loss of voice (laryngitis).  Cough.  Fatigue.  Muscle aches.  Loss of appetite.  Headache.  Low-grade fever. DIAGNOSIS  You might diagnose your own cold based on familiar symptoms, since most people get a cold 2 to 3 times a year. Your caregiver can confirm this based on your exam. Most importantly, your caregiver can check that your symptoms are not due to another disease such as strep throat, sinusitis, pneumonia, asthma, or epiglottitis. Blood tests, throat tests, and X-rays are not necessary to diagnose a common cold, but they may sometimes be helpful in excluding other more serious diseases. Your caregiver will decide if any further tests are required. RISKS AND COMPLICATIONS  You may be at risk for a more severe case of the common cold if you smoke cigarettes, have chronic heart disease (such as heart failure) or lung disease (such as asthma), or if you have a weakened immune system. The very young and very old are also at risk for more serious infections. Bacterial sinusitis, middle ear infections, and bacterial pneumonia can complicate the common cold. The common cold can worsen asthma and chronic obstructive pulmonary disease (COPD). Sometimes, these complications can require emergency medical care and may be life-threatening. PREVENTION  The best way to protect against getting a cold is to practice good hygiene. Avoid oral or hand contact with people with cold symptoms. Wash your hands often if contact occurs. There is no clear evidence that vitamin C, vitamin E, echinacea, or exercise reduces the chance of developing a cold. However, it is always recommended to  get plenty of rest and practice good nutrition. TREATMENT  Treatment is directed at relieving symptoms. There is no cure. Antibiotics are not effective, because the infection is caused by a virus, not by bacteria. Treatment may include:  Increased fluid intake. Sports drinks offer valuable electrolytes, sugars, and fluids.  Breathing heated mist or steam (vaporizer or shower).  Eating chicken soup or other clear broths, and maintaining good nutrition.  Getting plenty of rest.  Using gargles or lozenges for comfort.  Controlling fevers with ibuprofen or acetaminophen as directed by your caregiver.  Increasing usage of your inhaler if you have asthma. Zinc gel and zinc lozenges, taken in the first 24 hours of the common cold, can shorten the duration and lessen the severity of symptoms. Pain medicines may help with fever, muscle aches, and throat pain. A variety of non-prescription medicines are available to treat congestion and runny nose. Your caregiver can make recommendations and may suggest nasal or lung inhalers for other symptoms.  HOME CARE INSTRUCTIONS   Only take over-the-counter or prescription medicines for pain, discomfort, or fever as directed by your caregiver.  Use a warm mist humidifier or inhale steam from a shower to increase air moisture. This may keep secretions moist and make it easier to breathe.  Drink enough water and fluids to keep your urine clear or pale yellow.  Rest as needed.  Return to work when your temperature has returned  to normal or as your caregiver advises. You may need to stay home longer to avoid infecting others. You can also use a face mask and careful hand washing to prevent spread of the virus. SEEK MEDICAL CARE IF:   After the first few days, you feel you are getting worse rather than better.  You need your caregiver's advice about medicines to control symptoms.  You develop chills, worsening shortness of breath, or brown or red sputum.  These may be signs of pneumonia.  You develop yellow or brown nasal discharge or pain in the face, especially when you bend forward. These may be signs of sinusitis.  You develop a fever, swollen neck glands, pain with swallowing, or white areas in the back of your throat. These may be signs of strep throat. SEEK IMMEDIATE MEDICAL CARE IF:   You have a fever.  You develop severe or persistent headache, ear pain, sinus pain, or chest pain.  You develop wheezing, a prolonged cough, cough up blood, or have a change in your usual mucus (if you have chronic lung disease).  You develop sore muscles or a stiff neck. Document Released: 12/17/2000 Document Revised: 09/15/2011 Document Reviewed: 10/25/2010 [facility] Patient Information 2014 [place], [place].  Bacterial Vaginosis Bacterial vaginosis is a vaginal infection that occurs when the normal balance of bacteria in the vagina is disrupted. It results from an overgrowth of certain bacteria. This is the most common vaginal infection in women of childbearing age. Treatment is important to prevent complications, especially in pregnant women, as it can cause a premature delivery. CAUSES  Bacterial vaginosis is caused by an increase in harmful bacteria that are normally present in smaller amounts in the vagina. Several different kinds of bacteria can cause bacterial vaginosis. However, the reason that the condition develops is not fully understood. RISK FACTORS Certain activities or behaviors can put you at an increased risk of developing bacterial vaginosis, including:  Having a new sex partner or multiple sex partners.  Douching.  Using an intrauterine device (IUD) for contraception. Women do not get bacterial vaginosis from toilet seats, bedding, swimming pools, or contact with objects around them. SIGNS AND SYMPTOMS  Some women with bacterial vaginosis have no signs or symptoms. Common symptoms include:  Grey vaginal discharge.  A fishlike  odor with discharge, especially after sexual intercourse.  Itching or burning of the vagina and vulva.  Burning or pain with urination. DIAGNOSIS  Your health care provider will take a medical history and examine the vagina for signs of bacterial vaginosis. A sample of vaginal fluid may be taken. Your health care provider will look at this sample under a microscope to check for bacteria and abnormal cells. A vaginal pH test may also be done.  TREATMENT  Bacterial vaginosis may be treated with antibiotic medicines. These may be given in the form of a pill or a vaginal cream. A second round of antibiotics may be prescribed if the condition comes back after treatment.  HOME CARE INSTRUCTIONS   Only take over-the-counter or prescription medicines as directed by your health care provider.  If antibiotic medicine was prescribed, take it as directed. Make sure you finish it even if you start to feel better.  Do not have sex until treatment is completed.  Tell all sexual partners that you have a vaginal infection. They should see their health care provider and be treated if they have problems, such as a mild rash or itching.  Practice safe sex by using condoms and only  having one sex partner. SEEK MEDICAL CARE IF:   Your symptoms are not improving after 3 days of treatment.  You have increased discharge or pain.  You have a fever. MAKE SURE YOU:   Understand these instructions.  Will watch your condition.  Will get help right away if you are not doing well or get worse. FOR MORE INFORMATION  Centers for Disease Control and Prevention, Division of STD Prevention: [employer] American Sexual Health Association (ASHA): www.ashastd.org  Document Released: 06/23/2005 Document Revised: 04/13/2013 Document Reviewed: 02/02/2013 [facility] Patient Information 2014 [place], [place].

## 2013-07-17 NOTE — ED Provider Notes (Signed)
CSN: [id number]     Arrival date & time 07/17/13  0825 History   First MD Initiated Contact with Patient 07/17/13 [phone]     Chief Complaint  Patient presents with  . Chest Pain  . Cough  . Vaginal Discharge   (Consider location/radiation/quality/duration/timing/severity/associated sxs/prior Treatment) Patient is a 38 y.o. female presenting with chest pain, cough, and vaginal discharge. The history is provided by the patient.  Chest Pain Associated symptoms: back pain and cough   Associated symptoms: no abdominal pain, no dysphagia, no headache, no nausea, no numbness, no shortness of breath, not vomiting and no weakness   Cough Associated symptoms: chest pain, chills, myalgias and wheezing   Associated symptoms: no headaches, no rash, no shortness of breath and no sore throat   Vaginal Discharge Associated symptoms: no abdominal pain, no nausea and no vomiting    patient's had a cough for the last 2 days. She's had some reduction. She states she's had some chills but did not have a thermometer. She's had some mild nausea no vomiting. She also has had some dysuria and lower back pain. She states she's had some white vaginal discharge. She is a smoker. No lightheadedness or dizziness. She does have myalgias.  Past Medical History  Diagnosis Date  . Opiate use   . Kidney stones    Past Surgical History  Procedure Laterality Date  . Therapeutic abortion    . No past surgeries    . Umbilical hernia repair  06/15/2012    Procedure: HERNIA REPAIR UMBILICAL ADULT;  Surgeon: Ernestene MentionHaywood M Ingram, MD;  Location: [facility];  Service: General;  Laterality: N/A;  . Insertion of mesh  06/15/2012    Procedure: INSERTION OF MESH;  Surgeon: Ernestene MentionHaywood M Ingram, MD;  Location: [facility];  Service: General;  Laterality: N/A;  . Cesarean section  2002/2004/2009/2013    x4   Family History  Problem Relation Age of Onset  . Diabetes Maternal Grandfather    History  Substance Use Topics  . Smoking status:  Current Every Day Smoker -- 1.00 packs/day for 15 years  . Smokeless tobacco: Never Used  . Alcohol Use: Yes     Comment: rarely   OB History   Grav Para Term Preterm Abortions TAB SAB Ect Mult Living   6 4 4  2 1 1   4      Review of Systems  Constitutional: Positive for chills and appetite change. Negative for activity change.  HENT: Negative for sore throat and trouble swallowing.   Eyes: Negative for pain.  Respiratory: Positive for cough and wheezing. Negative for chest tightness and shortness of breath.   Cardiovascular: Positive for chest pain. Negative for leg swelling.  Gastrointestinal: Negative for nausea, vomiting, abdominal pain and diarrhea.  Genitourinary: Positive for vaginal discharge. Negative for flank pain.  Musculoskeletal: Positive for back pain and myalgias. Negative for neck stiffness.  Skin: Negative for rash.  Neurological: Negative for weakness, numbness and headaches.  Psychiatric/Behavioral: Negative for behavioral problems.    Allergies  Ambien and Penicillins  Home Medications   Current Outpatient Rx  Name  Route  Sig  Dispense  Refill  . acetaminophen (TYLENOL) 500 MG tablet   Oral   Take 1,000 mg by mouth 3 (three) times daily as needed.          Marland Kitchen. amphetamine-dextroamphetamine (ADDERALL) 20 MG tablet   Oral   Take 20 mg by mouth 2 (two) times daily.         .Marland Kitchen  ARIPiprazole (ABILIFY) 15 MG tablet   Oral   Take 15 mg by mouth daily.         . baclofen (LIORESAL) 10 MG tablet   Oral   Take 5 mg by mouth 3 (three) times daily.         Marland Kitchen ibuprofen (ADVIL,MOTRIN) 200 MG tablet   Oral   Take 800 mg by mouth every 6 (six) hours as needed.         . metroNIDAZOLE (FLAGYL) 500 MG tablet   Oral   Take 1 tablet (500 mg total) by mouth 2 (two) times daily.   14 tablet   0   . oxyCODONE-acetaminophen (PERCOCET/ROXICET) 5-325 MG per tablet   Oral   Take 1-2 tablets by mouth every 6 (six) hours as needed for severe pain.   8  tablet   0   . predniSONE (DELTASONE) 20 MG tablet   Oral   Take 3 tablets (60 mg total) by mouth daily.   9 tablet   0    BP 111/74  Pulse 89  Temp(Src) 97.6 F (36.4 C) (Oral)  Resp 20  Wt 190 lb (86.183 kg)  SpO2 97%  LMP 06/22/2013 Physical Exam  Nursing note and vitals reviewed. Constitutional: She is oriented to person, place, and time. She appears well-developed and well-nourished.  HENT:  Head: Normocephalic and atraumatic.  Eyes: EOM are normal. Pupils are equal, round, and reactive to light.  Neck: Normal range of motion. Neck supple.  Cardiovascular: Regular rhythm and normal heart sounds.   No murmur heard. Mild tachycardia  Pulmonary/Chest: Effort normal. No respiratory distress. She has wheezes. She has no rales.  Abdominal: Soft. Bowel sounds are normal. She exhibits no distension. There is no tenderness. There is no rebound and no guarding.  Musculoskeletal: Normal range of motion.  Neurological: She is alert and oriented to person, place, and time. No cranial nerve deficit.  Skin: Skin is warm and dry.  Psychiatric: She has a normal mood and affect. Her speech is normal.   pelvic exam showed some mild white vaginal discharge without cervical motion tenderness. No adnexal tenderness. And a  ED Course  Procedures (including critical care time) Labs Review Labs Reviewed  WET PREP, GENITAL - Abnormal; Notable for the following:    Trich, Wet Prep MANY (*)    Clue Cells Wet Prep HPF POC MANY (*)    WBC, Wet Prep HPF POC FEW (*)    All other components within normal limits  URINALYSIS, ROUTINE W REFLEX MICROSCOPIC - Abnormal; Notable for the following:    APPearance CLOUDY (*)    Bilirubin Urine LARGE (*)    Nitrite POSITIVE (*)    Leukocytes, UA TRACE (*)    All other components within normal limits  GC/CHLAMYDIA PROBE AMP  PREGNANCY, URINE  URINE MICROSCOPIC-ADD ON   Imaging Review Dg Chest 2 View  07/17/2013   CLINICAL DATA:  Right upper chest  pain  EXAM: CHEST  2 VIEW  COMPARISON:  01/14/2013  FINDINGS: The heart size and mediastinal contours are within normal limits. Both lungs are clear. The visualized skeletal structures are unremarkable.  IMPRESSION: No active cardiopulmonary disease.   Electronically Signed   By: Ruel Favors M.D.   On: 07/17/2013 09:57    EKG Interpretation   None       MDM   1. URI (upper respiratory infection)   2. Bacterial vaginosis   3. Trichimoniasis    Patient with cough and  URI. Negative x-ray. Does have some wheezing can. Given inhaler and steroids. Also has bacterial vaginosis and Trichomonas. We'll treat with antibiotics. Patient was informed of the need for partner she be treated.    Juliet Rude. Rubin Payor, MD 07/17/13 1531

## 2013-07-17 NOTE — ED Notes (Signed)
Patient discharged to home with family. NAD.  

## 2013-07-17 NOTE — ED Notes (Signed)
Pt is here with 2-3 days of coughing with green/yellow sputum, wheezing, and reports chest pain not related to coughing.  Pt reports vaginal discharge and reports sensitive to BV.

## 2013-07-18 LAB — GC/CHLAMYDIA PROBE AMP
CT Probe RNA: NEGATIVE
GC Probe RNA: NEGATIVE

## 2013-07-28 ENCOUNTER — Emergency Department (HOSPITAL_COMMUNITY)
Admission: EM | Admit: 2013-07-28 | Discharge: 2013-07-29 | Disposition: A | Discharge status: Home / Self Care | Payer: Medicaid Other | Attending: Emergency Medicine | Admitting: Emergency Medicine

## 2013-07-28 ENCOUNTER — Encounter (HOSPITAL_COMMUNITY): Payer: Self-pay | Admitting: Emergency Medicine

## 2013-07-28 DIAGNOSIS — R143 Flatulence: Secondary | ICD-10-CM

## 2013-07-28 DIAGNOSIS — Z79899 Other long term (current) drug therapy: Secondary | ICD-10-CM | POA: Insufficient documentation

## 2013-07-28 DIAGNOSIS — N390 Urinary tract infection, site not specified: Secondary | ICD-10-CM | POA: Insufficient documentation

## 2013-07-28 DIAGNOSIS — Z3202 Encounter for pregnancy test, result negative: Secondary | ICD-10-CM | POA: Insufficient documentation

## 2013-07-28 DIAGNOSIS — Z87442 Personal history of urinary calculi: Secondary | ICD-10-CM | POA: Insufficient documentation

## 2013-07-28 DIAGNOSIS — R1110 Vomiting, unspecified: Secondary | ICD-10-CM | POA: Insufficient documentation

## 2013-07-28 DIAGNOSIS — F17200 Nicotine dependence, unspecified, uncomplicated: Secondary | ICD-10-CM | POA: Insufficient documentation

## 2013-07-28 DIAGNOSIS — R141 Gas pain: Secondary | ICD-10-CM | POA: Insufficient documentation

## 2013-07-28 DIAGNOSIS — R142 Eructation: Secondary | ICD-10-CM | POA: Insufficient documentation

## 2013-07-28 DIAGNOSIS — Z880 Allergy status to penicillin: Secondary | ICD-10-CM | POA: Insufficient documentation

## 2013-07-28 DIAGNOSIS — R140 Abdominal distension (gaseous): Secondary | ICD-10-CM

## 2013-07-28 LAB — URINALYSIS, ROUTINE W REFLEX MICROSCOPIC
Bilirubin Urine: NEGATIVE
Glucose, UA: NEGATIVE mg/dL
Hgb urine dipstick: NEGATIVE
Ketones, ur: NEGATIVE mg/dL
Leukocytes, UA: NEGATIVE
Nitrite: POSITIVE — AB
Protein, ur: NEGATIVE mg/dL
Specific Gravity, Urine: 1.017 (ref 1.005–1.030)
Urobilinogen, UA: 0.2 mg/dL (ref 0.0–1.0)
pH: 7.5 (ref 5.0–8.0)

## 2013-07-28 LAB — POCT PREGNANCY, URINE: Preg Test, Ur: NEGATIVE

## 2013-07-28 LAB — CBC WITH DIFFERENTIAL/PLATELET
Basophils Absolute: 0 10*3/uL (ref 0.0–0.1)
Basophils Relative: 1 % (ref 0–1)
Eosinophils Absolute: 0.3 10*3/uL (ref 0.0–0.7)
Eosinophils Relative: 5 % (ref 0–5)
HCT: 35.7 % — ABNORMAL LOW (ref 36.0–46.0)
Hemoglobin: 12.2 g/dL (ref 12.0–15.0)
Lymphocytes Relative: 25 % (ref 12–46)
Lymphs Abs: 1.4 10*3/uL (ref 0.7–4.0)
MCH: 33 pg (ref 26.0–34.0)
MCHC: 34.2 g/dL (ref 30.0–36.0)
MCV: 96.5 fL (ref 78.0–100.0)
Monocytes Absolute: 0.6 10*3/uL (ref 0.1–1.0)
Monocytes Relative: 12 % (ref 3–12)
Neutro Abs: 3.1 10*3/uL (ref 1.7–7.7)
Neutrophils Relative %: 57 % (ref 43–77)
Platelets: 243 10*3/uL (ref 150–400)
RBC: 3.7 MIL/uL — ABNORMAL LOW (ref 3.87–5.11)
RDW: 13.2 % (ref 11.5–15.5)
WBC: 5.4 10*3/uL (ref 4.0–10.5)

## 2013-07-28 LAB — COMPREHENSIVE METABOLIC PANEL
ALT: 86 U/L — ABNORMAL HIGH (ref 0–35)
AST: 64 U/L — ABNORMAL HIGH (ref 0–37)
Albumin: 3.4 g/dL — ABNORMAL LOW (ref 3.5–5.2)
Alkaline Phosphatase: 139 U/L — ABNORMAL HIGH (ref 39–117)
BUN: 17 mg/dL (ref 6–23)
CO2: 22 mEq/L (ref 19–32)
Calcium: 8.8 mg/dL (ref 8.4–10.5)
Chloride: 105 mEq/L (ref 96–112)
Creatinine, Ser: 0.81 mg/dL (ref 0.50–1.10)
GFR calc Af Amer: >90 mL/min (ref >90)
GFR calc non Af Amer: >90 mL/min (ref >90)
Glucose, Bld: 111 mg/dL — ABNORMAL HIGH (ref 70–99)
Potassium: 4.3 mEq/L (ref 3.7–5.3)
Sodium: 139 mEq/L (ref 137–147)
Total Bilirubin: <0.2 mg/dL — ABNORMAL LOW (ref 0.3–1.2)
Total Protein: 7 g/dL (ref 6.0–8.3)

## 2013-07-28 LAB — URINE MICROSCOPIC-ADD ON

## 2013-07-28 MED ORDER — CEPHALEXIN 250 MG PO CAPS
500.0000 mg | ORAL_CAPSULE | Freq: Once | ORAL | Status: AC
  Administered 2013-07-29: 500 mg via ORAL
  Filled 2013-07-28: qty 2

## 2013-07-28 NOTE — ED Notes (Signed)
Per EMS: [facility]/o abdominal pain for two days. Pt reports abdominal distention. No n/v. Pt is A&Ox4, respirations equal and unlabored, skin warm and dry.

## 2013-07-29 ENCOUNTER — Emergency Department (HOSPITAL_COMMUNITY): Payer: Medicaid Other

## 2013-07-29 ENCOUNTER — Encounter (HOSPITAL_COMMUNITY): Payer: Self-pay | Admitting: Radiology

## 2013-07-29 LAB — WET PREP, GENITAL
Clue Cells Wet Prep HPF POC: NONE SEEN
Trich, Wet Prep: NONE SEEN
WBC, Wet Prep HPF POC: NONE SEEN
Yeast Wet Prep HPF POC: NONE SEEN

## 2013-07-29 LAB — LIPASE, BLOOD: Lipase: 29 U/L (ref 11–59)

## 2013-07-29 LAB — GC/CHLAMYDIA PROBE AMP
CT Probe RNA: NEGATIVE
GC Probe RNA: NEGATIVE

## 2013-07-29 LAB — CG4 I-STAT (LACTIC ACID): Lactic Acid, Venous: 0.74 mmol/L (ref 0.5–2.2)

## 2013-07-29 MED ORDER — OXYCODONE HCL 5 MG PO TABS
10.0000 mg | ORAL_TABLET | Freq: Once | ORAL | Status: AC
  Administered 2013-07-29: 10 mg via ORAL
  Filled 2013-07-29: qty 2

## 2013-07-29 MED ORDER — IOHEXOL 300 MG/ML  SOLN
100.0000 mL | Freq: Once | INTRAMUSCULAR | Status: AC | PRN
  Administered 2013-07-29: 100 mL via INTRAVENOUS

## 2013-07-29 MED ORDER — ONDANSETRON HCL 4 MG/2ML IJ SOLN
4.0000 mg | Freq: Once | INTRAMUSCULAR | Status: AC
  Administered 2013-07-29: 4 mg via INTRAVENOUS
  Filled 2013-07-29: qty 2

## 2013-07-29 MED ORDER — SODIUM CHLORIDE 0.9 % IV BOLUS (SEPSIS)
1000.0000 mL | Freq: Once | INTRAVENOUS | Status: AC
  Administered 2013-07-29: 1000 mL via INTRAVENOUS

## 2013-07-29 MED ORDER — MORPHINE SULFATE 4 MG/ML IJ SOLN
4.0000 mg | Freq: Once | INTRAMUSCULAR | Status: AC
  Administered 2013-07-29: 4 mg via INTRAVENOUS
  Filled 2013-07-29: qty 1

## 2013-07-29 MED ORDER — OXYCODONE-ACETAMINOPHEN 5-325 MG PO TABS
2.0000 | ORAL_TABLET | Freq: Once | ORAL | Status: DC
  Filled 2013-07-29: qty 2

## 2013-07-29 MED ORDER — CEPHALEXIN 500 MG PO CAPS: 500.0000 mg | ORAL_CAPSULE | Freq: Two times a day (BID) | ORAL | Status: DC

## 2013-07-29 MED ORDER — OXYCODONE-ACETAMINOPHEN 5-325 MG PO TABS: 1.0000 | ORAL_TABLET | Freq: Four times a day (QID) | ORAL | Status: DC | PRN

## 2013-07-29 NOTE — ED Provider Notes (Signed)
CSN: [id number]     Arrival date & time 07/28/13  2113 History   First MD Initiated Contact with Patient 07/28/13 2309     Chief Complaint  Patient presents with  . Abdominal Pain   (Consider location/radiation/quality/duration/timing/severity/associated sxs/prior Treatment) HPI Comments: Patient is a 38 year old female with a history of kidney stones who presents to the emergency department for worsening abdominal distention x3 days. Patient states that over the last 24 hours she feels as though her abdomen has "tripled in size". She states associated pain is throbbing and aching in nature. She has taken Percocet with only mild relief. She states symptoms have been associated with 1 episode of NB/NB emesis as well as dysuria. She denies fever, chest pain, SOB, diarrhea, melena, hematochezia, vaginal complaints, hematuria, and numbness/tingling. She denies a hx of IVDU. She states she last had a normal BM yesterday.  Patient is a 38 y.o. female presenting with abdominal pain. The history is provided by the patient. No language interpreter was used.  Abdominal Pain Associated symptoms: dysuria and vomiting (NB/NB x 1)   Associated symptoms: no chest pain, no diarrhea, no fever, no hematuria, no nausea, no shortness of breath, no vaginal bleeding and no vaginal discharge     Past Medical History  Diagnosis Date  . Opiate use   . Kidney stones    Past Surgical History  Procedure Laterality Date  . Therapeutic abortion    . No past surgeries    . Umbilical hernia repair  06/15/2012    Procedure: HERNIA REPAIR UMBILICAL ADULT;  Surgeon: Ernestene MentionHaywood M Ingram, MD;  Location: [facility];  Service: General;  Laterality: N/A;  . Insertion of mesh  06/15/2012    Procedure: INSERTION OF MESH;  Surgeon: Ernestene MentionHaywood M Ingram, MD;  Location: [facility];  Service: General;  Laterality: N/A;  . Cesarean section  2002/2004/2009/2013    x4   Family History  Problem Relation Age of Onset  . Diabetes Maternal  Grandfather    History  Substance Use Topics  . Smoking status: Current Every Day Smoker -- 1.00 packs/day for 15 years  . Smokeless tobacco: Never Used  . Alcohol Use: Yes     Comment: rarely   OB History   Grav Para Term Preterm Abortions TAB SAB Ect Mult Living   6 4 4  2 1 1   4      Review of Systems  Constitutional: Negative for fever.  Respiratory: Negative for shortness of breath.   Cardiovascular: Negative for chest pain.  Gastrointestinal: Positive for vomiting (NB/NB x 1), abdominal pain and abdominal distention. Negative for nausea, diarrhea and blood in stool.  Genitourinary: Positive for dysuria. Negative for hematuria, vaginal bleeding, vaginal discharge and pelvic pain.  Neurological: Negative for weakness and numbness.  All other systems reviewed and are negative.    Allergies  Ambien and Penicillins  Home Medications   Current Outpatient Rx  Name  Route  Sig  Dispense  Refill  . acetaminophen (TYLENOL) 500 MG tablet   Oral   Take 1,000 mg by mouth 3 (three) times daily as needed.          Marland Kitchen. albuterol (PROVENTIL HFA;VENTOLIN HFA) 108 (90 BASE) MCG/ACT inhaler   Inhalation   Inhale 2 puffs into the lungs every 6 (six) hours as needed for wheezing or shortness of breath.         . amphetamine-dextroamphetamine (ADDERALL) 20 MG tablet   Oral   Take 20 mg by mouth 2 (two)  times daily.         . ARIPiprazole (ABILIFY) 15 MG tablet   Oral   Take 15 mg by mouth 2 (two) times daily.          . baclofen (LIORESAL) 10 MG tablet   Oral   Take 5 mg by mouth 3 (three) times daily.         Marland Kitchen gabapentin (NEURONTIN) 300 MG capsule   Oral   Take 300 mg by mouth 3 (three) times daily.         . hydrOXYzine (VISTARIL) 50 MG capsule   Oral   Take 50 mg by mouth 3 (three) times daily as needed for anxiety.         Marland Kitchen ibuprofen (ADVIL,MOTRIN) 200 MG tablet   Oral   Take 800 mg by mouth every 6 (six) hours as needed.         . meloxicam (MOBIC)  7.5 MG tablet   Oral   Take 7.5 mg by mouth daily as needed for pain.         . cephALEXin (KEFLEX) 500 MG capsule   Oral   Take 1 capsule (500 mg total) by mouth 2 (two) times daily.   14 capsule   0   . oxyCODONE-acetaminophen (PERCOCET/ROXICET) 5-325 MG per tablet   Oral   Take 1-2 tablets by mouth every 6 (six) hours as needed for severe pain.   7 tablet   0    BP 109/60  Pulse 85  Temp(Src) 99.5 F (37.5 C) (Oral)  Resp 18  SpO2 96%  LMP 06/13/2013  Physical Exam  Nursing note and vitals reviewed. Constitutional: She is oriented to person, place, and time. She appears well-developed and well-nourished. No distress.  HENT:  Head: Normocephalic and atraumatic.  Eyes: Conjunctivae and EOM are normal. No scleral icterus.  Neck: Normal range of motion. Neck supple.  Cardiovascular: Normal rate, regular rhythm and normal heart sounds.   Pulmonary/Chest: Effort normal and breath sounds normal. No respiratory distress. She has no wheezes. She has no rales.  Abdominal: Soft. She exhibits distension (marked distention of abdomen). She exhibits no mass. There is tenderness (suprapubic). There is no rebound and no guarding.  Abdomen soft. No peritoneal signs or guarding.  Musculoskeletal: Normal range of motion.  Neurological: She is alert and oriented to person, place, and time.  Skin: Skin is warm and dry. No rash noted. She is not diaphoretic. No erythema. No pallor.  Psychiatric: She has a normal mood and affect. Her behavior is normal.    ED Course  Procedures (including critical care time) Labs Review Labs Reviewed  CBC WITH DIFFERENTIAL - Abnormal; Notable for the following:    RBC 3.70 (*)    HCT 35.7 (*)    All other components within normal limits  COMPREHENSIVE METABOLIC PANEL - Abnormal; Notable for the following:    Glucose, Bld 111 (*)    Albumin 3.4 (*)    AST 64 (*)    ALT 86 (*)    Alkaline Phosphatase 139 (*)    Total Bilirubin <0.2 (*)    All  other components within normal limits  URINALYSIS, ROUTINE W REFLEX MICROSCOPIC - Abnormal; Notable for the following:    APPearance CLOUDY (*)    Nitrite POSITIVE (*)    All other components within normal limits  URINE MICROSCOPIC-ADD ON - Abnormal; Notable for the following:    Bacteria, UA MANY (*)    All other components within  normal limits  WET PREP, GENITAL  GC/CHLAMYDIA PROBE AMP  LIPASE, BLOOD  POCT PREGNANCY, URINE  CG4 I-STAT (LACTIC ACID)   Imaging Review Ct Abdomen Pelvis W Contrast  07/29/2013   CLINICAL DATA:  Abdominal pain and distention.  Nausea and vomiting.  EXAM: CT ABDOMEN AND PELVIS WITH CONTRAST  TECHNIQUE: Multidetector CT imaging of the abdomen and pelvis was performed using the standard protocol following bolus administration of intravenous contrast.  CONTRAST:  OMNIPAQUE IOHEXOL 300 MG/ML  SOLN  COMPARISON:  CT of the abdomen and pelvis performed 09/27/2012  FINDINGS: The visualized lung bases are clear.  The liver and spleen are unremarkable in appearance. The gallbladder is within normal limits. The pancreas and adrenal glands are unremarkable.  There appears to be a large portal-systemic shunt arising at the main portal vein and ending at the left renal vein.  The kidneys are unremarkable in appearance. There is no evidence of hydronephrosis. No renal or ureteral stones are seen. No perinephric stranding is appreciated.  No free fluid is identified. The small bowel is unremarkable in appearance. The stomach is within normal limits. No acute vascular abnormalities are seen.  The appendix is normal in caliber and contains air, without evidence for appendicitis. The colon is unremarkable in appearance.  The bladder is mildly distended and grossly unremarkable. The uterus is grossly normal in appearance. The patient is status post tubal ligation. The ovaries are relatively symmetric; no suspicious adnexal masses are seen. No No inguinal lymphadenopathy is seen.  No  acute osseous abnormalities are identified. 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  IMPRESSION: 1. No acute abnormality seen to explain the patient's symptoms. 2. 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 3. 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   Electronically Signed   By: Roanna Raider M.D.   On: 07/29/2013 02:05    EKG Interpretation   None       MDM   1. Abdominal distention   2. UTI (urinary tract infection)    38 year old female presents for suprapubic abdominal discomfort as well as worsening abdominal distention. She is well and nontoxic appearing, hemodynamically stable, and afebrile. Physical exam significant for distended abdomen. Abdomen, however, soft with only mild focal suprapubic tenderness. No peritoneal signs or guarding.  Urinalysis today suggests infection given many bacteria and positive nitrites. Patient does not have leukocytosis, anemia, or electrolyte imbalance. Kidney function preserved. LFTs elevated, but c/w priors from 1 year ago. Abdominal CT ordered to further evaluate abdominal distention. CT without any acute intra-abdominal findings to explain patient's symptoms. Abdominal reexaminations continued to be stable. Do not believe additional emergent workup is indicated at this time.  Patient stable and appropriate for discharge with referral to GI for further workup. Will also prescribe Keflex for suspected UTI. Return precautions discussed and patient agreeable to plan with no unaddressed concerns.   Filed Vitals:   07/28/13 2116 07/28/13 2303 07/29/13 0115 07/29/13 0336  BP: 142/90 132/76 109/60 117/77  Pulse: 117 96 85 94  Temp: 99.5 F (37.5 C)     TempSrc: Oral     Resp: 18 18  18   SpO2: 97% 100% 96% 99%     Antony Madura, PA-C 08/02/13 1706

## 2013-07-29 NOTE — Discharge Instructions (Signed)
Urinary Tract Infection  Urinary tract infections (UTIs) can develop anywhere along your urinary tract. Your urinary tract is your body's drainage system for removing wastes and extra water. Your urinary tract includes two kidneys, two ureters, a bladder, and a urethra. Your kidneys are a pair of bean-shaped organs. Each kidney is about the size of your fist. They are located below your ribs, one on each side of your spine.  CAUSES  Infections are caused by microbes, which are microscopic organisms, including fungi, viruses, and bacteria. These organisms are so small that they can only be seen through a microscope. Bacteria are the microbes that most commonly cause UTIs.  SYMPTOMS   Symptoms of UTIs may vary by age and gender of the patient and by the location of the infection. Symptoms in young women typically include a frequent and intense urge to urinate and a painful, burning feeling in the bladder or urethra during urination. Older women and men are more likely to be tired, shaky, and weak and have muscle aches and abdominal pain. A fever may mean the infection is in your kidneys. Other symptoms of a kidney infection include pain in your back or sides below the ribs, nausea, and vomiting.  DIAGNOSIS  To diagnose a UTI, your caregiver will ask you about your symptoms. Your caregiver also will ask to provide a urine sample. The urine sample will be tested for bacteria and white blood cells. White blood cells are made by your body to help fight infection.  TREATMENT   Typically, UTIs can be treated with medication. Because most UTIs are caused by a bacterial infection, they usually can be treated with the use of antibiotics. The choice of antibiotic and length of treatment depend on your symptoms and the type of bacteria causing your infection.  HOME CARE INSTRUCTIONS   If you were prescribed antibiotics, take them exactly as your caregiver instructs you. Finish the medication even if you feel better after you  have only taken some of the medication.   Drink enough water and fluids to keep your urine clear or pale yellow.   Avoid caffeine, tea, and carbonated beverages. They tend to irritate your bladder.   Empty your bladder often. Avoid holding urine for long periods of time.   Empty your bladder before and after sexual intercourse.   After a bowel movement, women should cleanse from front to back. Use each tissue only once.  SEEK MEDICAL CARE IF:    You have back pain.   You develop a fever.   Your symptoms do not begin to resolve within 3 days.  SEEK IMMEDIATE MEDICAL CARE IF:    You have severe back pain or lower abdominal pain.   You develop chills.   You have nausea or vomiting.   You have continued burning or discomfort with urination.  MAKE SURE YOU:    Understand these instructions.   Will watch your condition.   Will get help right away if you are not doing well or get worse.  Document Released: 04/02/2005 Document Revised: 12/23/2011 Document Reviewed: 08/01/2011  [employer] Patient Information 2014 [employer]

## 2013-08-03 NOTE — ED Provider Notes (Signed)
Medical screening examination/treatment/procedure(s) were performed by non-physician practitioner and as supervising physician I was immediately available for consultation/collaboration.    Olga M Otter, MD 08/03/13 0515 

## 2013-08-27 ENCOUNTER — Encounter (HOSPITAL_COMMUNITY): Payer: Self-pay | Admitting: Emergency Medicine

## 2013-08-27 ENCOUNTER — Emergency Department (HOSPITAL_COMMUNITY)
Admission: EM | Admit: 2013-08-27 | Discharge: 2013-08-27 | Disposition: A | Discharge status: Home / Self Care | Payer: Medicaid Other | Attending: Emergency Medicine | Admitting: Emergency Medicine

## 2013-08-27 DIAGNOSIS — A499 Bacterial infection, unspecified: Secondary | ICD-10-CM | POA: Insufficient documentation

## 2013-08-27 DIAGNOSIS — Z79899 Other long term (current) drug therapy: Secondary | ICD-10-CM | POA: Insufficient documentation

## 2013-08-27 DIAGNOSIS — B9689 Other specified bacterial agents as the cause of diseases classified elsewhere: Secondary | ICD-10-CM | POA: Insufficient documentation

## 2013-08-27 DIAGNOSIS — Z87442 Personal history of urinary calculi: Secondary | ICD-10-CM | POA: Insufficient documentation

## 2013-08-27 DIAGNOSIS — Z880 Allergy status to penicillin: Secondary | ICD-10-CM | POA: Insufficient documentation

## 2013-08-27 DIAGNOSIS — N72 Inflammatory disease of cervix uteri: Secondary | ICD-10-CM | POA: Insufficient documentation

## 2013-08-27 DIAGNOSIS — N760 Acute vaginitis: Secondary | ICD-10-CM | POA: Insufficient documentation

## 2013-08-27 DIAGNOSIS — Z3202 Encounter for pregnancy test, result negative: Secondary | ICD-10-CM | POA: Insufficient documentation

## 2013-08-27 DIAGNOSIS — F17200 Nicotine dependence, unspecified, uncomplicated: Secondary | ICD-10-CM | POA: Insufficient documentation

## 2013-08-27 LAB — URINALYSIS, ROUTINE W REFLEX MICROSCOPIC
Bilirubin Urine: NEGATIVE
Glucose, UA: NEGATIVE mg/dL
Ketones, ur: NEGATIVE mg/dL
Leukocytes, UA: NEGATIVE
Nitrite: NEGATIVE
Protein, ur: NEGATIVE mg/dL
Specific Gravity, Urine: 1.013 (ref 1.005–1.030)
Urobilinogen, UA: 0.2 mg/dL (ref 0.0–1.0)
pH: 6.5 (ref 5.0–8.0)

## 2013-08-27 LAB — RPR: RPR Ser Ql: NONREACTIVE

## 2013-08-27 LAB — URINE MICROSCOPIC-ADD ON

## 2013-08-27 LAB — PREGNANCY, URINE: Preg Test, Ur: NEGATIVE

## 2013-08-27 LAB — HIV ANTIBODY (ROUTINE TESTING W REFLEX): HIV: NONREACTIVE

## 2013-08-27 LAB — WET PREP, GENITAL
Trich, Wet Prep: NONE SEEN
Yeast Wet Prep HPF POC: NONE SEEN

## 2013-08-27 MED ORDER — LIDOCAINE HCL 1 % IJ SOLN
INTRAMUSCULAR | Status: AC
  Administered 2013-08-27: 0.9 mL
  Filled 2013-08-27: qty 20

## 2013-08-27 MED ORDER — METRONIDAZOLE 500 MG PO TABS
2000.0000 mg | ORAL_TABLET | Freq: Once | ORAL | Status: AC
  Administered 2013-08-27: 2000 mg via ORAL
  Filled 2013-08-27: qty 4

## 2013-08-27 MED ORDER — HYDROCODONE-ACETAMINOPHEN 5-325 MG PO TABS
1.0000 | ORAL_TABLET | Freq: Once | ORAL | Status: AC
  Administered 2013-08-27: 1 via ORAL
  Filled 2013-08-27: qty 1

## 2013-08-27 MED ORDER — CEFTRIAXONE SODIUM 250 MG IJ SOLR
250.0000 mg | Freq: Once | INTRAMUSCULAR | Status: AC
  Administered 2013-08-27: 250 mg via INTRAMUSCULAR
  Filled 2013-08-27: qty 250

## 2013-08-27 MED ORDER — AZITHROMYCIN 250 MG PO TABS
1000.0000 mg | ORAL_TABLET | Freq: Once | ORAL | Status: AC
  Administered 2013-08-27: 1000 mg via ORAL
  Filled 2013-08-27: qty 4
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
## 2013-08-27 NOTE — ED Provider Notes (Signed)
CSN: [id number]     Arrival date & time 08/27/13  0915 History   First MD Initiated Contact with Patient 08/27/13 [phone]     Chief Complaint  Patient presents with  . Dysuria  . Abdominal Pain     (Consider location/radiation/quality/duration/timing/severity/associated sxs/prior Treatment) The history is provided by the patient.   Patient presents with external vaginal burning and itching worse with urination also with lower abdominal pain that is sharp comes and goes rated a 7/10 intensity. She also has abnormal creamy white vaginal discharge. This all began 4-5 days ago. Last menstrual period was February 1 it was on time. Denies fever, chills, nausea, vomiting, change in bowel habits. She is concerned she might have an STD. She is taking the medication today but took Tylenol yesterday without improvement of her symptoms.  Past Medical History  Diagnosis Date  . Opiate use   . Kidney stones    Past Surgical History  Procedure Laterality Date  . Therapeutic abortion    . No past surgeries    . Umbilical hernia repair  06/15/2012    Procedure: HERNIA REPAIR UMBILICAL ADULT;  Surgeon: Ernestene Mention, MD;  Location: [facility];  Service: General;  Laterality: N/A;  . Insertion of mesh  06/15/2012    Procedure: INSERTION OF MESH;  Surgeon: Ernestene Mention, MD;  Location: [facility];  Service: General;  Laterality: N/A;  . Cesarean section  2002/2004/2009/2013    x4   Family History  Problem Relation Age of Onset  . Diabetes Maternal Grandfather    History  Substance Use Topics  . Smoking status: Current Every Day Smoker -- 1.00 packs/day for 15 years    Types: Cigarettes  . Smokeless tobacco: Never Used  . Alcohol Use: Yes     Comment: rarely   OB History   Grav Para Term Preterm Abortions TAB SAB Ect Mult Living   6 4 4  2 1 1   4      Review of Systems  Constitutional: Negative for fever.  Respiratory: Negative for cough and shortness of breath.   Cardiovascular: Negative  for chest pain.  Gastrointestinal: Positive for abdominal pain. Negative for nausea, vomiting and diarrhea.  Genitourinary: Positive for dysuria, vaginal discharge and pelvic pain. Negative for urgency, frequency and vaginal bleeding.  All other systems reviewed and are negative.      Allergies  Ambien and Penicillins  Home Medications   Current Outpatient Rx  Name  Route  Sig  Dispense  Refill  . acetaminophen (TYLENOL) 500 MG tablet   Oral   Take 1,000 mg by mouth 3 (three) times daily as needed.          Marland Kitchen amphetamine-dextroamphetamine (ADDERALL) 20 MG tablet   Oral   Take 20 mg by mouth 2 (two) times daily.         . ARIPiprazole (ABILIFY) 15 MG tablet   Oral   Take 15 mg by mouth 2 (two) times daily.          . baclofen (LIORESAL) 10 MG tablet   Oral   Take 5 mg by mouth 3 (three) times daily.         Marland Kitchen buPROPion (WELLBUTRIN) 100 MG tablet   Oral   Take 100 mg by mouth daily.          BP 112/75  Pulse 96  Temp(Src) 98.5 F (36.9 C) (Oral)  Resp 14  SpO2 99%  LMP 08/07/2013 Physical Exam  Nursing note  and vitals reviewed. Constitutional: She appears well-developed and well-nourished. No distress.  HENT:  Head: Normocephalic and atraumatic.  Neck: Neck supple.  Pulmonary/Chest: Effort normal.  Abdominal: Soft. She exhibits no mass. There is tenderness. There is no rebound and no guarding.  Genitourinary: Uterus is tender. Cervix exhibits discharge. Right adnexum displays no mass, no tenderness and no fullness. Left adnexum displays no mass, no tenderness and no fullness. There is tenderness around the vagina. No erythema or bleeding around the vagina. No foreign body around the vagina. Vaginal discharge found.  Cervix is mildly tender with yellow discharge vagina with clear thin discharge.  Neurological: She is alert.  Skin: She is not diaphoretic.    ED Course  Procedures (including critical care time) Labs Review Labs Reviewed  WET PREP,  GENITAL - Abnormal; Notable for the following:    Clue Cells Wet Prep HPF POC FEW (*)    WBC, Wet Prep HPF POC FEW (*)    All other components within normal limits  URINALYSIS, ROUTINE W REFLEX MICROSCOPIC - Abnormal; Notable for the following:    APPearance CLOUDY (*)    Hgb urine dipstick SMALL (*)    All other components within normal limits  URINE MICROSCOPIC-ADD ON - Abnormal; Notable for the following:    Squamous Epithelial / LPF MANY (*)    All other components within normal limits  GC/CHLAMYDIA PROBE AMP  PREGNANCY, URINE  RPR  HIV ANTIBODY (ROUTINE TESTING)   Imaging Review No results found.  EKG Interpretation   None       MDM   Final diagnoses:  Cervicitis  Bacterial vaginosis    Patient with abnormal vaginal discharge, lower abdominal discomfort, found to have bacterial vaginosis and mild cervical tenderness, pt chose to be treated for GC/Chlam.  Discussed result, findings, treatment, and follow up  with patient.  Pt given return precautions.  Pt verbalizes understanding and agrees with plan.        Flowery BranchEmily West, PA-C 08/27/13 1503

## 2013-08-27 NOTE — Discharge Instructions (Signed)
Read the information below.  You may return to the Emergency Department at any time for worsening condition or any new symptoms that concern you.  If you develop high fevers, worsening abdominal pain, uncontrolled vomiting, or are unable to tolerate fluids by mouth, return to the ER for a recheck.  Your tests for HIV, syphilis, gonorrhea, and chlamydia are not yet resulted.  You may follow up on My Chart or with medical records to find out your results.  If you do have a sexually transmitted infection your sexual partner(s) must also be tested and treated.  Do not engage in sexual activity for 10 days following the last person's treatment.     Bacterial Vaginosis Bacterial vaginosis is a vaginal infection that occurs when the normal balance of bacteria in the vagina is disrupted. It results from an overgrowth of certain bacteria. This is the most common vaginal infection in women of childbearing age. Treatment is important to prevent complications, especially in pregnant women, as it can cause a premature delivery. CAUSES  Bacterial vaginosis is caused by an increase in harmful bacteria that are normally present in smaller amounts in the vagina. Several different kinds of bacteria can cause bacterial vaginosis. However, the reason that the condition develops is not fully understood. RISK FACTORS Certain activities or behaviors can put you at an increased risk of developing bacterial vaginosis, including:  Having a new sex partner or multiple sex partners.  Douching.  Using an intrauterine device (IUD) for contraception. Women do not get bacterial vaginosis from toilet seats, bedding, swimming pools, or contact with objects around them. SIGNS AND SYMPTOMS  Some women with bacterial vaginosis have no signs or symptoms. Common symptoms include:  Grey vaginal discharge.  A fishlike odor with discharge, especially after sexual intercourse.  Itching or burning of the vagina and vulva.  Burning or  pain with urination. DIAGNOSIS  Your health care provider will take a medical history and examine the vagina for signs of bacterial vaginosis. A sample of vaginal fluid may be taken. Your health care provider will look at this sample under a microscope to check for bacteria and abnormal cells. A vaginal pH test may also be done.  TREATMENT  Bacterial vaginosis may be treated with antibiotic medicines. These may be given in the form of a pill or a vaginal cream. A second round of antibiotics may be prescribed if the condition comes back after treatment.  HOME CARE INSTRUCTIONS   Only take over-the-counter or prescription medicines as directed by your health care provider.  If antibiotic medicine was prescribed, take it as directed. Make sure you finish it even if you start to feel better.  Do not have sex until treatment is completed.  Tell all sexual partners that you have a vaginal infection. They should see their health care provider and be treated if they have problems, such as a mild rash or itching.  Practice safe sex by using condoms and only having one sex partner. SEEK MEDICAL CARE IF:   Your symptoms are not improving after 3 days of treatment.  You have increased discharge or pain.  You have a fever. MAKE SURE YOU:   Understand these instructions.  Will watch your condition.  Will get help right away if you are not doing well or get worse. FOR MORE INFORMATION  Centers for Disease Control and Prevention, Division of STD Prevention: SolutionApps.co.za American Sexual Health Association (ASHA): www.ashastd.org  Document Released: 06/23/2005 Document Revised: 04/13/2013 Document Reviewed: 02/02/2013 ExitCare Patient  Information 2014 [place], [place].  Sexually Transmitted Disease A sexually transmitted disease (STD) is a disease or infection that may be passed (transmitted) from person to person, usually during sexual activity. This may happen by way of saliva, semen, blood,  vaginal mucus, or urine. Common STDs include:   Gonorrhea.   Chlamydia.   Syphilis.   HIV and AIDS.   Genital herpes.   Hepatitis B and C.   Trichomonas.   Human papillomavirus (HPV).   Pubic lice.   Scabies.  Mites.  Bacterial vaginosis. WHAT ARE CAUSES OF STDs? An STD may be caused by bacteria, a virus, or parasites. STDs are often transmitted during sexual activity if one person is infected. However, they may also be transmitted through nonsexual means. STDs may be transmitted after:   Sexual intercourse with an infected person.   Sharing sex toys with an infected person.   Sharing needles with an infected person or using unclean piercing or tattoo needles.  Having intimate contact with the genitals, mouth, or rectal areas of an infected person.   Exposure to infected fluids during birth. WHAT ARE THE SIGNS AND SYMPTOMS OF STDs? Different STDs have different symptoms. Some people may not have any symptoms. If symptoms are present, they may include:   Painful or bloody urination.   Pain in the pelvis, abdomen, vagina, anus, throat, or eyes.   Skin rash, itching, irritation, growths, sores (lesions), ulcerations, or warts in the genital or anal area.  Abnormal vaginal discharge with or without bad odor.   Penile discharge in men.   Fever.   Pain or bleeding during sexual intercourse.   Swollen glands in the groin area.   Yellow skin and eyes (jaundice). This is seen with hepatitis.   Swollen testicles.  Infertility.  Sores and blisters in the mouth. HOW ARE STDs DIAGNOSED? To make a diagnosis, your health care provider may:   Take a medical history.   Perform a physical exam.   Take a sample of any discharge for examination.  Swab the throat, cervix, opening to the penis, rectum, or vagina for testing.  Test a sample of your first morning urine.   Perform blood tests.   Perform a Pap smear, if this applies.    Perform a colposcopy.   Perform a laparoscopy.  HOW ARE STDs TREATED? Treatment depends on the STD. Some STDs may be treated but not cured.   Chlamydia, gonorrhea, trichomonas, and syphilis can be cured with antibiotics.   Genital herpes, hepatitis, and HIV can be treated, but not cured, with prescribed medicines. The medicines lessen symptoms.   Genital warts from HPV can be treated with medicine or by freezing, burning (electrocautery), or surgery. Warts may come back.   HPV cannot be cured with medicine or surgery. However, abnormal areas may be removed from the cervix, vagina, or vulva.   If your diagnosis is confirmed, your recent sexual partners need treatment. This is true even if they are symptom-free or have a negative culture or evaluation. They should not have sex until their health care providers say it is OK. HOW CAN I REDUCE MY RISK OF GETTING AN STD?  Use latex condoms, dental dams, and water-soluble lubricants during sexual activity. Do not use petroleum jelly or oils.  Get vaccinated for HPV and hepatitis. If you have not received these vaccines in the past, talk to your health care provider about whether one or both might be right for you.   Avoid risky sex practices that can  break the skin.  WHAT SHOULD I DO IF I THINK I HAVE AN STD?  See your health care provider.   Inform all sexual partners. They should be tested and treated for any STDs.  Do not have sex until your health care provider says it is OK. WHEN SHOULD I GET HELP? Seek immediate medical care if:  You develop severe abdominal pain.  You are a man and notice swelling or pain in the testicles.  You are a woman and notice swelling or pain in your vagina. Document Released: 09/13/2002 Document Revised: 04/13/2013 Document Reviewed: 01/11/2013 [facility] Patient Information 2014 [place], [place].

## 2013-08-27 NOTE — ED Notes (Signed)
Per pt, burnng with urination-states she also wants to be checked for STDs-states pelvic dicomfort

## 2013-08-29 LAB — GC/CHLAMYDIA PROBE AMP
CT Probe RNA: NEGATIVE
GC Probe RNA: NEGATIVE

## 2013-10-30 ENCOUNTER — Emergency Department (HOSPITAL_COMMUNITY)
Admission: EM | Admit: 2013-10-30 | Discharge: 2013-10-30 | Disposition: A | Discharge status: Home / Self Care | Payer: Medicaid Other | Attending: Emergency Medicine | Admitting: Emergency Medicine

## 2013-10-30 ENCOUNTER — Encounter (HOSPITAL_COMMUNITY): Payer: Self-pay | Admitting: Emergency Medicine

## 2013-10-30 DIAGNOSIS — N390 Urinary tract infection, site not specified: Secondary | ICD-10-CM

## 2013-10-30 DIAGNOSIS — L02219 Cutaneous abscess of trunk, unspecified: Secondary | ICD-10-CM | POA: Insufficient documentation

## 2013-10-30 DIAGNOSIS — L03311 Cellulitis of abdominal wall: Secondary | ICD-10-CM

## 2013-10-30 DIAGNOSIS — Z880 Allergy status to penicillin: Secondary | ICD-10-CM | POA: Insufficient documentation

## 2013-10-30 DIAGNOSIS — L03319 Cellulitis of trunk, unspecified: Principal | ICD-10-CM

## 2013-10-30 DIAGNOSIS — Z87442 Personal history of urinary calculi: Secondary | ICD-10-CM | POA: Insufficient documentation

## 2013-10-30 DIAGNOSIS — Z79899 Other long term (current) drug therapy: Secondary | ICD-10-CM | POA: Insufficient documentation

## 2013-10-30 DIAGNOSIS — R110 Nausea: Secondary | ICD-10-CM | POA: Insufficient documentation

## 2013-10-30 DIAGNOSIS — F17200 Nicotine dependence, unspecified, uncomplicated: Secondary | ICD-10-CM | POA: Insufficient documentation

## 2013-10-30 LAB — URINE MICROSCOPIC-ADD ON

## 2013-10-30 LAB — URINALYSIS, ROUTINE W REFLEX MICROSCOPIC
Bilirubin Urine: NEGATIVE
Glucose, UA: NEGATIVE mg/dL
Ketones, ur: NEGATIVE mg/dL
Leukocytes, UA: NEGATIVE
Nitrite: POSITIVE — AB
Protein, ur: NEGATIVE mg/dL
Specific Gravity, Urine: 1.01 (ref 1.005–1.030)
Urobilinogen, UA: 0.2 mg/dL (ref 0.0–1.0)
pH: 7 (ref 5.0–8.0)

## 2013-10-30 MED ORDER — HYDROCODONE-ACETAMINOPHEN 5-325 MG PO TABS: 1.0000 | ORAL_TABLET | ORAL | Status: DC | PRN

## 2013-10-30 MED ORDER — PHENAZOPYRIDINE HCL 200 MG PO TABS: 200.0000 mg | ORAL_TABLET | Freq: Three times a day (TID) | ORAL | Status: DC

## 2013-10-30 MED ORDER — SULFAMETHOXAZOLE-TRIMETHOPRIM 800-160 MG PO TABS: 1.0000 | ORAL_TABLET | Freq: Two times a day (BID) | ORAL | Status: AC

## 2013-10-30 NOTE — ED Notes (Signed)
Pt reports left inguinal abscess x4 days. Pt denies any fevers,v/d. Pt also reports was treated for UTI x1 month ago and pt reports " i dont think it got cleared up." pt reports trouble initiating urine stream, dysuria, and lower back pain. nad noted.

## 2013-10-30 NOTE — Discharge Instructions (Signed)
The antibiotic we are prescribing for you should cover the UTI and the skin infection. We have sent the urine for culture. If we need to change or add an antibiotic we will call you. Return as needed for worsening symptoms.

## 2013-10-30 NOTE — ED Provider Notes (Signed)
CSN: [id number]     Arrival date & time 10/30/13  1142 History  This chart was scribed for non-physician practitioner, Kerrie BuffaloHope Neese, NP, working with Donnetta HutchingBrian Cook, MD by Shari HeritageAisha Amuda, ED Scribe. This patient was seen in room APFT22/APFT22 and the patient's care was started at 12:18 PM.  Chief Complaint  Patient presents with  . Abscess     Patient is a 38 y.o. female presenting with rash. The history is provided by the patient. No language interpreter was used.  Rash Location:  Torso Torso rash location:  Abd LLQ Quality: painful and redness   Pain details:    Severity:  Moderate   Timing:  Constant   Progression:  Worsening Duration:  4 days Timing:  Constant Chronicity:  New Associated symptoms: nausea   Associated symptoms: no abdominal pain, no fever, no headaches, no shortness of breath and not vomiting     HPI Comments: Misty Young is a 38 y.o. female who presents to the Emergency Department complaining of a moderately painful, raised area of erythema to her left lower abdomen and left groin that started 4 days ago. Patient states that the area began as a bump and it has gotten progressively larger and more red since onset. There is associated nausea. Patient also reports back pain but she feels this is related to recent urinary problems she has been having. She states that she has been having difficulty starting her urine stream. She was treated for UTI last month with ciprofloxacin. Patient denies vomiting, fever, or chills. She is a smoker.    Past Medical History  Diagnosis Date  . Opiate use   . Kidney stones    Past Surgical History  Procedure Laterality Date  . Therapeutic abortion    . No past surgeries    . Umbilical hernia repair  06/15/2012    Procedure: HERNIA REPAIR UMBILICAL ADULT;  Surgeon: Ernestene MentionHaywood M Ingram, MD;  Location: [facility];  Service: General;  Laterality: N/A;  . Insertion of mesh  06/15/2012    Procedure: INSERTION OF MESH;  Surgeon: Ernestene MentionHaywood M  Ingram, MD;  Location: [facility];  Service: General;  Laterality: N/A;  . Cesarean section  2002/2004/2009/2013    x4   Family History  Problem Relation Age of Onset  . Diabetes Maternal Grandfather    History  Substance Use Topics  . Smoking status: Current Every Day Smoker -- 1.00 packs/day for 15 years    Types: Cigarettes  . Smokeless tobacco: Never Used  . Alcohol Use: No   OB History   Grav Para Term Preterm Abortions TAB SAB Ect Mult Living   6 4 4  2 1 1   4      Review of Systems  Constitutional: Negative for fever and chills.  Eyes: Negative for visual disturbance.  Respiratory: Negative for cough and shortness of breath.   Cardiovascular: Negative for chest pain.  Gastrointestinal: Positive for nausea. Negative for vomiting and abdominal pain.  Genitourinary: Positive for difficulty urinating. Negative for dysuria.  Musculoskeletal: Positive for back pain.  Skin: Positive for rash.  Neurological: Negative for syncope and headaches.  Psychiatric/Behavioral: Negative for confusion.    Allergies  Ambien and Penicillins  Home Medications   Prior to Admission medications   Medication Sig Start Date End Date Taking? Authorizing Provider  acetaminophen (TYLENOL) 500 MG tablet Take 1,000 mg by mouth 3 (three) times daily as needed.     Historical Provider, MD  amphetamine-dextroamphetamine (ADDERALL) 20 MG tablet Take  20 mg by mouth 2 (two) times daily.    Historical Provider, MD  ARIPiprazole (ABILIFY) 15 MG tablet Take 15 mg by mouth 2 (two) times daily.     Historical Provider, MD  baclofen (LIORESAL) 10 MG tablet Take 5 mg by mouth 3 (three) times daily.    Historical Provider, MD  buPROPion (WELLBUTRIN) 100 MG tablet Take 100 mg by mouth daily.    Historical Provider, MD   Triage Vitals: BP 116/84  Pulse 102  Temp(Src) 98.1 F (36.7 C) (Oral)  Resp 16  Ht $R [id number]$5\' 11"  SpO2 100%  LMP 10/05/2013 Physical Exam   Nursing note and vitals reviewed. Constitutional: She is oriented to person, place, and time. She appears well-developed and well-nourished. No distress.  HENT:  Head: Normocephalic and atraumatic.  Eyes: EOM are normal.  Neck: Neck supple. No tracheal deviation present.  Cardiovascular: Normal rate.   Pulmonary/Chest: Effort normal. No respiratory distress.  Abdominal: Soft. There is tenderness in the suprapubic area. There is no CVA tenderness.  Musculoskeletal: Normal range of motion.  Neurological: She is alert and oriented to person, place, and time.  Skin: Skin is warm and dry. There is erythema.  Abscess to the left lower abdomen near the symphysis pubis that is tender to palpation. There is a hard indurated area measuring about 1 cm and is surrounded by an approximately 4 cm area of erythema.  Psychiatric: She has a normal mood and affect. Her behavior is normal.    ED Course  Procedures (including critical care time) DIAGNOSTIC STUDIES: Oxygen Saturation is 100% on room air, normal by my interpretation.    COORDINATION OF CARE: 12:21 PM- Will treat patient for UTI and abscess with Bactrim, phenazopyridine, and Norco for pain. Abscess not amenable to incision and drainage. Patient informed of current plan for treatment and evaluation and agrees with plan at this time.    Labs Review Results for orders placed during the hospital encounter of 10/30/13 (from the past 24 hour(s))  URINALYSIS, ROUTINE W REFLEX MICROSCOPIC     Status: Abnormal   Collection Time    10/30/13 12:03 PM      Result Value Ref Range   Color, Urine YELLOW  YELLOW   APPearance HAZY (*) CLEAR   Specific Gravity, Urine 1.010  1.005 - 1.030   pH 7.0  5.0 - 8.0   Glucose, UA NEGATIVE  NEGATIVE mg/dL   Hgb urine dipstick TRACE (*) NEGATIVE   Bilirubin Urine NEGATIVE  NEGATIVE   Ketones, ur NEGATIVE  NEGATIVE mg/dL   Protein, ur NEGATIVE  NEGATIVE mg/dL   Urobilinogen, UA 0.2  0.0 - 1.0 mg/dL   Nitrite  POSITIVE (*) NEGATIVE   Leukocytes, UA NEGATIVE  NEGATIVE  URINE MICROSCOPIC-ADD ON     Status: Abnormal   Collection Time    10/30/13 12:03 PM      Result Value Ref Range   Squamous Epithelial / LPF FEW (*) RARE   WBC, UA 3-6  <3 WBC/hpf   RBC / HPF 0-2  <3 RBC/hpf   Bacteria, UA MANY (*) RARE     MDM  38 y.o. female with UTI symptoms and skin infection. Will treat with Bactrim to cover UTI and skin infection. I have reviewed this patient's vital signs, nurses notes, appropriate labs and discussed findings and plan of care with the patient. She agrees to plan.    Medication List  TAKE these medications       HYDROcodone-acetaminophen 5-325 MG per tablet  Commonly known as:  NORCO/VICODIN  Take 1 tablet by mouth every 4 (four) hours as needed.     phenazopyridine 200 MG tablet  Commonly known as:  PYRIDIUM  Take 1 tablet (200 mg total) by mouth 3 (three) times daily.     sulfamethoxazole-trimethoprim 800-160 MG per tablet  Commonly known as:  BACTRIM DS,SEPTRA DS  Take 1 tablet by mouth 2 (two) times daily.      ASK your doctor about these medications       acetaminophen 500 MG tablet  Commonly known as:  TYLENOL  Take 1,000 mg by mouth 3 (three) times daily as needed.     amphetamine-dextroamphetamine 20 MG tablet  Commonly known as:  ADDERALL  Take 20 mg by mouth 2 (two) times daily.     ARIPiprazole 15 MG tablet  Commonly known as:  ABILIFY  Take 15 mg by mouth 2 (two) times daily.     baclofen 10 MG tablet  Commonly known as:  LIORESAL  Take 5 mg by mouth 3 (three) times daily as needed for muscle spasms.     ibuprofen 200 MG tablet  Commonly known as:  ADVIL,MOTRIN  Take 800 mg by mouth every 6 (six) hours as needed for moderate pain.          I personally performed the services described in this documentation, which was scribed in my presence. The recorded information has been reviewed and is accurate.    [facility] Orlene Och, [place] 10/30/13 [id number]

## 2013-10-31 NOTE — ED Provider Notes (Signed)
Medical screening examination/treatment/procedure(s) were performed by non-physician practitioner and as supervising physician I was immediately available for consultation/collaboration.   EKG Interpretation None       Misty HutchingBrian Cook, MD 10/31/13 1622

## 2013-11-01 LAB — URINE CULTURE: Colony Count: >=100000

## 2013-11-04 ENCOUNTER — Telehealth (HOSPITAL_BASED_OUTPATIENT_CLINIC_OR_DEPARTMENT_OTHER): Payer: Self-pay | Admitting: Emergency Medicine

## 2013-11-04 NOTE — Telephone Encounter (Signed)
Post ED Visit - Positive Culture Follow-up  Culture report reviewed by antimicrobial stewardship pharmacist: []  Wes Dulaney, Pharm.D., BCPS []  Celedonio MiyamotoJeremy Frens, [address] BCPS [x]  Georgina PillionElizabeth Martin, [address]Ph[address] BCPS []  MemphisMinh Pham, [address]Ph[address]., BCPS, AAHIVP []  Estella HuskMichelle Turner, Pharm.D., BCPS, AAHIVP []  Harvie JuniorNathan Cope, Pharm.D.  Positive urine culture Treated with Sulfa-Trimeth, organism sensitive to the same and no further patient follow-up is required at this time.  Kylie Holland 11/04/2013, 2:05 PM

## 2014-05-04 ENCOUNTER — Encounter: Payer: Self-pay | Admitting: Gastroenterology

## 2014-05-05 ENCOUNTER — Telehealth: Payer: Self-pay

## 2014-05-05 ENCOUNTER — Encounter (HOSPITAL_COMMUNITY): Payer: Self-pay | Admitting: Emergency Medicine

## 2014-05-05 ENCOUNTER — Emergency Department (HOSPITAL_COMMUNITY)
Admission: EM | Admit: 2014-05-05 | Discharge: 2014-05-05 | Discharge status: Against Medical Advice | Payer: Medicaid Other | Attending: Emergency Medicine | Admitting: Emergency Medicine

## 2014-05-05 DIAGNOSIS — Z720 Tobacco use: Secondary | ICD-10-CM | POA: Insufficient documentation

## 2014-05-05 DIAGNOSIS — K6289 Other specified diseases of anus and rectum: Secondary | ICD-10-CM | POA: Diagnosis not present

## 2014-05-05 NOTE — ED Provider Notes (Addendum)
Patient leaving and did not speak to me.   Hilario Quarryanielle S Ray, MD 05/05/14 [id number]  Hilario Quarryanielle S Ray, MD 05/05/14 [phone]

## 2014-05-05 NOTE — Telephone Encounter (Signed)
Pt had been referred by PCP for rectal bleeding and hemorrhoids. Darl PikesSusan had scheduled her an appt for 06/12/2014 at 11:30 AM.  I called pt to see if she needed an urgent appt, and she said her hemorrhoids are hanging out and she is having a little bleeding, but she is on way to the ED now. I told her we will leave the appt in December for now and see if the ED wants her seen here earlier.

## 2014-05-05 NOTE — ED Notes (Signed)
Patient and husband came to desk stating they were going to leave, because no one had been in to see them and she has a GI MD appointment scheduled later.  Spoke to Dr. Adriana Simasook who said Dr .Juleen ChinaKohut would see patient.  Dr. Juleen ChinaKohut notified and is to see patient.  Informed patient of this.  Husband stated if MD was not in in 15 minutes they would leave.

## 2014-05-05 NOTE — ED Notes (Signed)
Pt reports nausea, and "hemorroids hanging out "x 4-5 days. Pt denies any dizziness or abdominal pain.

## 2014-05-05 NOTE — ED Notes (Signed)
Pt and significant other left room without being evaluated

## 2014-05-08 ENCOUNTER — Encounter (HOSPITAL_COMMUNITY): Payer: Self-pay | Admitting: Emergency Medicine

## 2014-05-08 NOTE — Telephone Encounter (Signed)
Patient left ED prior to being seen. Keep appt here as scheduled unless she calls back in the interim and then we can address whether urgent or not.

## 2014-05-08 NOTE — Telephone Encounter (Signed)
Noted  

## 2014-06-12 ENCOUNTER — Other Ambulatory Visit: Payer: Self-pay

## 2014-06-12 ENCOUNTER — Encounter: Payer: Self-pay | Admitting: Gastroenterology

## 2014-06-12 ENCOUNTER — Ambulatory Visit (INDEPENDENT_AMBULATORY_CARE_PROVIDER_SITE_OTHER): Payer: Medicaid Other | Admitting: Gastroenterology

## 2014-06-12 VITALS — BP 126/80 | HR 107 | Temp 97.1°F | Ht 71.0 in | Wt 212.8 lb

## 2014-06-12 DIAGNOSIS — R932 Abnormal findings on diagnostic imaging of liver and biliary tract: Secondary | ICD-10-CM

## 2014-06-12 DIAGNOSIS — K625 Hemorrhage of anus and rectum: Secondary | ICD-10-CM

## 2014-06-12 DIAGNOSIS — K5900 Constipation, unspecified: Secondary | ICD-10-CM | POA: Insufficient documentation

## 2014-06-12 DIAGNOSIS — R7989 Other specified abnormal findings of blood chemistry: Secondary | ICD-10-CM

## 2014-06-12 DIAGNOSIS — R1900 Intra-abdominal and pelvic swelling, mass and lump, unspecified site: Secondary | ICD-10-CM | POA: Insufficient documentation

## 2014-06-12 DIAGNOSIS — K649 Unspecified hemorrhoids: Secondary | ICD-10-CM

## 2014-06-12 DIAGNOSIS — R945 Abnormal results of liver function studies: Secondary | ICD-10-CM

## 2014-06-12 MED ORDER — LINACLOTIDE 290 MCG PO CAPS: 290.0000 ug | ORAL_CAPSULE | Freq: Every day | ORAL | Status: DC

## 2014-06-12 MED ORDER — PEG-KCL-NACL-NASULF-NA ASC-C 100 G PO SOLR: 1.0000 | ORAL | Status: DC

## 2014-06-12 NOTE — Patient Instructions (Signed)
1. Please have your labs done. 2. Start Linzess 290mcg daily for constipation. 3. Colonoscopy as scheduled. See separate instructions. 4. [facility] since you want to have your hemorrhoids managed surgically.

## 2014-06-12 NOTE — Progress Notes (Signed)
Primary Care Physician:  Lonia BloodGARBA,LAWAL, MD  Primary Gastroenterologist:  Jonette EvaSandi Fields, MD   Chief Complaint  Patient presents with  . Hemorrhoids  . Rectal Bleeding    HPI:  Misty Young is a 38 y.o. female here at the request of Dr. Mikeal HawthorneGarba for further evaluation of hemorrhoids and rectal bleeding.   She has h/o heavy menses and recently started on Lysteda during menses. Occasional heartburn, takes Rolaids as needed. No significant N/V. No dysphagia. Some pain above the umbilicus at site umbilical hernia repair. Worried about abdominal swelling. Sometimes worse than others. Worse with menses, flatus. Not related to meals. "people think I'm pregnant". No unintentional weight loss. Gained weight. BM strain, stool softeners. BM once per week usually. Some brbpr. No melena.   Last pain related to hemorrhodis about one week ago. Thrombosed X 1 within past 18 months. Has a lot of "hemorrhoids on the outside". Waiting to be seen with the Sub[facility] clinic.   H/o abnormal LFTs and CT while in ER back in 07/2013. 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 No work up done to date. Patient advised for GI work up in 07/2013 by ER physician but did not take place.  Current Outpatient Prescriptions  Medication Sig Dispense Refill  . acetaminophen (TYLENOL) 500 MG tablet Take 1,000 mg by mouth 3 (three) times daily as needed for moderate pain.     Marland Kitchen. ALPRAZolam (XANAX) 0.5 MG tablet Take 0.5 mg by mouth 3 (three) times daily.    Marland Kitchen. amphetamine-dextroamphetamine (ADDERALL) 20 MG tablet Take 20 mg by mouth 3 (three) times daily.     Marland Kitchen. ibuprofen (ADVIL,MOTRIN) 200 MG tablet Take 800 mg by mouth every 6 (six) hours as needed for moderate pain.    . tranexamic acid (LYSTEDA) 650 MG TABS tablet Take 1,300 mg by mouth 3 (three) times daily. Only takes it when she is on her menses     No current facility-administered medications for this visit.     Allergies as of 06/12/2014 - Review Complete 06/12/2014  Allergen Reaction Noted  . Ambien [zolpidem tartrate]  07/17/2013  . Penicillins Hives 01/27/2011    Past Medical History  Diagnosis Date  . Opiate use   . Kidney stones   . Attention deficit disorder     Past Surgical History  Procedure Laterality Date  . Therapeutic abortion    . Umbilical hernia repair  06/15/2012    Procedure: HERNIA REPAIR UMBILICAL ADULT;  Surgeon: Ernestene MentionHaywood M Ingram, MD;  Location: [facility];  Service: General;  Laterality: N/A;  . Insertion of mesh  06/15/2012    Procedure: INSERTION OF MESH;  Surgeon: Ernestene MentionHaywood M Ingram, MD;  Location: [facility];  Service: General;  Laterality: N/A;  . Cesarean section  2002/2004/2009/2013    x4    Family History  Problem Relation Age of Onset  . Diabetes Maternal Grandfather   . Colon cancer Neg Hx     History   Social History  . Marital Status: Divorced    Spouse Name: N/A    Number of Children: 4  . Years of Education: N/A   Occupational History  . unemployed    Social History Main Topics  . Smoking status: Current Every Day Smoker -- 1.00 packs/day for 15 years    Types: Cigarettes  . Smokeless tobacco: Never Used  . Alcohol Use: No  . Drug Use: No     Comment: last use  of marijuana x3 weeks ago.  Marland Kitchen. Sexual Activity: Yes   Other Topics Concern  . Not on file   Social History Narrative      ROS:  General: Negative for anorexia, weight loss, fever, chills, fatigue, weakness. Eyes: Negative for vision changes.  ENT: Negative for hoarseness, difficulty swallowing , nasal congestion. CV: Negative for chest pain, angina, palpitations, dyspnea on exertion, peripheral edema.  Respiratory: Negative for dyspnea at rest, dyspnea on exertion, cough, sputum, wheezing.  GI: See history of present illness. GU:  Negative for dysuria, hematuria, urinary incontinence, urinary frequency, nocturnal urination.  MS: Negative for joint pain, low back pain.   Derm: Negative for rash or itching.  Neuro: Negative for weakness, abnormal sensation, seizure, frequent headaches, memory loss, confusion.  Psych: Negative for anxiety, depression, suicidal ideation, hallucinations.  Endo: Negative for unusual weight change.  Heme: Negative for bruising or bleeding. Allergy: Negative for rash or hives.    Physical Examination:  BP 126/80 mmHg  Pulse 107  Temp(Src) 97.1 F (36.2 C)  Ht 5\' 11"  (1.803 m)  Wt 212 lb 12.8 oz (96.525 kg)  BMI 29.69 kg/m2  LMP 06/07/2014   General: Well-nourished, well-developed in no acute distress.  Head: Normocephalic, atraumatic.   Eyes: Conjunctiva pink, no icterus. Mouth: Oropharyngeal mucosa moist and pink , no lesions erythema or exudate. Neck: Supple without thyromegaly, masses, or lymphadenopathy.  Lungs: Clear to auscultation bilaterally.  Heart: Regular rate and rhythm, no murmurs rubs or gallops.  Abdomen: Bowel sounds are normal, nontender, nondistended, no hepatosplenomegaly or masses, no abdominal bruits or    hernia , no rebound or guarding.   Rectal: patient declined Extremities: No lower extremity edema. No clubbing or deformities.  Neuro: Alert and oriented x 4 , grossly normal neurologically.  Skin: Warm and dry, no rash or jaundice.   Psych: Alert and cooperative, normal mood and affect.  Labs: Lab Results  Component Value Date   WBC 5.4 07/28/2013   HGB 12.2 07/28/2013   HCT 35.7* 07/28/2013   MCV 96.5 07/28/2013   PLT 243 07/28/2013   Lab Results  Component Value Date   CREATININE 0.81 07/28/2013   BUN 17 07/28/2013   NA 139 07/28/2013   K 4.3 07/28/2013   CL 105 07/28/2013   CO2 22 07/28/2013   Lab Results  Component Value Date   ALT 86* 07/28/2013   AST 64* 07/28/2013   ALKPHOS 139* 07/28/2013   BILITOT <0.2* 07/28/2013     Imaging Studies: No results found.

## 2014-06-13 LAB — COMPREHENSIVE METABOLIC PANEL
ALT: 16 U/L (ref 0–35)
AST: 16 U/L (ref 0–37)
Albumin: 4.5 g/dL (ref 3.5–5.2)
Alkaline Phosphatase: 53 U/L (ref 39–117)
BUN: 15 mg/dL (ref 6–23)
CO2: 25 mEq/L (ref 19–32)
Calcium: 9.4 mg/dL (ref 8.4–10.5)
Chloride: 104 mEq/L (ref 96–112)
Creat: 0.82 mg/dL (ref 0.50–1.10)
Glucose, Bld: 102 mg/dL — ABNORMAL HIGH (ref 70–99)
Potassium: 4.4 mEq/L (ref 3.5–5.3)
Sodium: 137 mEq/L (ref 135–145)
Total Bilirubin: 0.5 mg/dL (ref 0.2–1.2)
Total Protein: 7 g/dL (ref 6.0–8.3)

## 2014-06-13 LAB — CBC WITH DIFFERENTIAL/PLATELET
Basophils Absolute: 0.1 10*3/uL (ref 0.0–0.1)
Basophils Relative: 1 % (ref 0–1)
Eosinophils Absolute: 0.3 10*3/uL (ref 0.0–0.7)
Eosinophils Relative: 6 % — ABNORMAL HIGH (ref 0–5)
HCT: 39.5 % (ref 36.0–46.0)
Hemoglobin: 13.3 g/dL (ref 12.0–15.0)
Lymphocytes Relative: 37 % (ref 12–46)
Lymphs Abs: 2.1 10*3/uL (ref 0.7–4.0)
MCH: 32 pg (ref 26.0–34.0)
MCHC: 33.7 g/dL (ref 30.0–36.0)
MCV: 95.2 fL (ref 78.0–100.0)
MPV: 9.6 fL (ref 9.4–12.4)
Monocytes Absolute: 0.4 10*3/uL (ref 0.1–1.0)
Monocytes Relative: 7 % (ref 3–12)
Neutro Abs: 2.8 10*3/uL (ref 1.7–7.7)
Neutrophils Relative %: 49 % (ref 43–77)
Platelets: 281 10*3/uL (ref 150–400)
RBC: 4.15 MIL/uL (ref 3.87–5.11)
RDW: 13.7 % (ref 11.5–15.5)
WBC: 5.8 10*3/uL (ref 4.0–10.5)

## 2014-06-13 LAB — HEPATITIS C ANTIBODY: HCV Ab: NEGATIVE

## 2014-06-13 LAB — HEPATITIS A ANTIBODY, TOTAL: Hep A Total Ab: NONREACTIVE

## 2014-06-13 LAB — HEPATITIS B SURFACE ANTIBODY,QUALITATIVE: Hep B S Ab: NEGATIVE

## 2014-06-13 LAB — HEPATITIS B SURFACE ANTIGEN: Hepatitis B Surface Ag: NEGATIVE

## 2014-06-14 NOTE — Assessment & Plan Note (Signed)
Abnormal CT of liver and abnormal LFTS. Repeat labs at this time. Further recommendations to follow.

## 2014-06-14 NOTE — Assessment & Plan Note (Addendum)
Rectal bleeding in the setting of constipation and known hemorrhoids. Patient has required I+D of thrombosed hemorrhoid in the past. Offered patient a colonoscopy with possible hemorrhoid banding at time of procedure based on appropriateness. She is very concerned about having hemorrhoids addressed on one single occasion and prefers seeing a Careers advisersurgeon for definitive treatment. She is not interested in banding at time of endoscopy or via [facility] technique.   Plan on colonoscopy with Dr. Darrick PennaFields with deep sedation in the OR.  I have discussed the risks, alternatives, benefits with regards to but not limited to the risk of reaction to medication, bleeding, infection, perforation and the patient is agreeable to proceed. Written consent to be obtained.  Start Linzess for constipation. Likely contributing to abdominal bloating/swelling.

## 2014-06-28 NOTE — Progress Notes (Signed)
Please see [facility] recommendations. Offer U/S with elastography due to portal systemic shunt

## 2014-06-28 NOTE — Progress Notes (Addendum)
PT W/O ETOH HISTORY AND NORMAL HFP. NOT QUALIFIED TO ASSESS THE HEMODYNAMIC SIGNIFICANCE OF SHUNT IN THE SETTING OF NL SPLEEN/PANCREAS/LIVER ON CT. WOULD COMPLETE EVAL WITH U/S-ELASTOGRAPHYPY. IF NO EVIDENCE FOR CIRRHOSIS LIKELY CONGENITAL, BUT PT  COULD DISCUSS WITH VASCULAR SURGERY.

## 2014-06-28 NOTE — Progress Notes (Signed)
Quick Note:  Please let patient know her LFTs are now normal! Her viral markers were negative. Colonoscopy as scheduled. ______

## 2014-06-28 NOTE — Progress Notes (Signed)
Quick Note:  Pt is aware. ______ 

## 2014-07-04 ENCOUNTER — Telehealth: Payer: Self-pay

## 2014-07-04 NOTE — Telephone Encounter (Signed)
Pt called to move her pre-op appointment because she is sick. Her new appointment is 07/06/14 @ 2:45pm. Pt's boyfriend is aware.

## 2014-07-04 NOTE — Progress Notes (Signed)
Per ginger, pt has called and cancelled her appt for procedure and said she is sick now and does not want to do anything until after the first of Jan.

## 2014-07-04 NOTE — Progress Notes (Signed)
LM for pt to call

## 2014-07-04 NOTE — Telephone Encounter (Signed)
Pt called to cancel her TCS. She is wanting to wait until after the first week of January

## 2014-07-04 NOTE — Telephone Encounter (Signed)
REVIEWED-NO ADDITIONAL RECOMMENDATIONS. 

## 2014-07-05 ENCOUNTER — Encounter (HOSPITAL_COMMUNITY): Admission: RE | Admit: 2014-07-05 | Payer: Medicaid Other | Source: Ambulatory Visit

## 2014-07-05 NOTE — Telephone Encounter (Signed)
REVIEWED-NO ADDITIONAL RECOMMENDATIONS. 

## 2014-07-06 ENCOUNTER — Other Ambulatory Visit (HOSPITAL_COMMUNITY): Payer: Medicaid Other

## 2014-07-11 ENCOUNTER — Encounter (HOSPITAL_COMMUNITY): Admission: RE | Payer: Self-pay | Source: Ambulatory Visit

## 2014-07-11 ENCOUNTER — Ambulatory Visit (HOSPITAL_COMMUNITY): Admission: RE | Admit: 2014-07-11 | Payer: Medicaid Other | Source: Ambulatory Visit | Admitting: Gastroenterology

## 2014-07-11 SURGERY — COLONOSCOPY WITH PROPOFOL
Anesthesia: Monitor Anesthesia Care

## 2014-07-11 NOTE — Progress Notes (Signed)
Can we please NIC to touch base with her later this month to see if she wants to reschedule her colonoscopy with propofol AND elastography.

## 2014-07-11 NOTE — Progress Notes (Signed)
I made a notation to call her.

## 2015-02-06 ENCOUNTER — Encounter (HOSPITAL_COMMUNITY): Payer: Self-pay

## 2015-02-06 ENCOUNTER — Emergency Department (HOSPITAL_COMMUNITY)
Admission: EM | Admit: 2015-02-06 | Discharge: 2015-02-06 | Disposition: A | Discharge status: Home / Self Care | Payer: Medicaid Other | Attending: Emergency Medicine | Admitting: Emergency Medicine

## 2015-02-06 DIAGNOSIS — R05 Cough: Secondary | ICD-10-CM | POA: Diagnosis not present

## 2015-02-06 DIAGNOSIS — Z87442 Personal history of urinary calculi: Secondary | ICD-10-CM | POA: Insufficient documentation

## 2015-02-06 DIAGNOSIS — K649 Unspecified hemorrhoids: Secondary | ICD-10-CM | POA: Diagnosis present

## 2015-02-06 DIAGNOSIS — Z720 Tobacco use: Secondary | ICD-10-CM | POA: Insufficient documentation

## 2015-02-06 DIAGNOSIS — F909 Attention-deficit hyperactivity disorder, unspecified type: Secondary | ICD-10-CM | POA: Insufficient documentation

## 2015-02-06 DIAGNOSIS — Z880 Allergy status to penicillin: Secondary | ICD-10-CM | POA: Diagnosis not present

## 2015-02-06 DIAGNOSIS — Z79899 Other long term (current) drug therapy: Secondary | ICD-10-CM | POA: Diagnosis not present

## 2015-02-06 DIAGNOSIS — K644 Residual hemorrhoidal skin tags: Secondary | ICD-10-CM | POA: Insufficient documentation

## 2015-02-06 MED ORDER — TRAMADOL HCL 50 MG PO TABS
100.0000 mg | ORAL_TABLET | Freq: Once | ORAL | Status: AC
  Administered 2015-02-06: 100 mg via ORAL
  Filled 2015-02-06: qty 2

## 2015-02-06 MED ORDER — MELOXICAM 15 MG PO TABS: 15.0000 mg | ORAL_TABLET | Freq: Every day | ORAL | Status: DC

## 2015-02-06 MED ORDER — TRAMADOL HCL 50 MG PO TABS: 50.0000 mg | ORAL_TABLET | Freq: Four times a day (QID) | ORAL | Status: DC | PRN

## 2015-02-06 MED ORDER — KETOROLAC TROMETHAMINE 10 MG PO TABS
10.0000 mg | ORAL_TABLET | Freq: Once | ORAL | Status: AC
  Administered 2015-02-06: 10 mg via ORAL
  Filled 2015-02-06: qty 1

## 2015-02-06 MED ORDER — HYDROCORTISONE ACETATE 25 MG RE SUPP: 25.0000 mg | Freq: Three times a day (TID) | RECTAL | Status: DC

## 2015-02-06 MED ORDER — ONDANSETRON HCL 4 MG PO TABS
4.0000 mg | ORAL_TABLET | Freq: Once | ORAL | Status: AC
  Administered 2015-02-06: 4 mg via ORAL
  Filled 2015-02-06: qty 1

## 2015-02-06 NOTE — ED Notes (Signed)
Pt not in waiting area when call name.

## 2015-02-06 NOTE — ED Notes (Signed)
Pt reports pain from hemorrhoids for the past 2 days.

## 2015-02-06 NOTE — Discharge Instructions (Signed)
Please increase water and fluids. Please continue your stool softener. Please see Dr. Derrell Lolling, or the surgeon of your choice concerning your hemorrhoids. Please use warm sits bath/tub soaks 2 times daily. Please use Anusol suppositories 3 times daily. Use mobic daily with food until all taken. Use Ultram every 6 hours if needed for more severe pain. Ultram may cause drowsiness, please do not drive, operate machinery, drink alcohol, is a patent activities requiring concentration while taking this medication. Please see Dr. Mikeal Hawthorne for assistance with your pain management of this problem. Hemorrhoids Hemorrhoids are swollen veins around the rectum or anus. There are two types of hemorrhoids:   Internal hemorrhoids. These occur in the veins just inside the rectum. They may poke through to the outside and become irritated and painful.  External hemorrhoids. These occur in the veins outside the anus and can be felt as a painful swelling or hard lump near the anus. CAUSES  Pregnancy.   Obesity.   Constipation or diarrhea.   Straining to have a bowel movement.   Sitting for long periods on the toilet.  Heavy lifting or other activity that caused you to strain.  Anal intercourse. SYMPTOMS   Pain.   Anal itching or irritation.   Rectal bleeding.   Fecal leakage.   Anal swelling.   One or more lumps around the anus.  DIAGNOSIS  Your caregiver may be able to diagnose hemorrhoids by visual examination. Other examinations or tests that may be performed include:   Examination of the rectal area with a gloved hand (digital rectal exam).   Examination of anal canal using a small tube (scope).   A blood test if you have lost a significant amount of blood.  A test to look inside the colon (sigmoidoscopy or colonoscopy). TREATMENT Most hemorrhoids can be treated at home. However, if symptoms do not seem to be getting better or if you have a lot of rectal bleeding, your caregiver  may perform a procedure to help make the hemorrhoids get smaller or remove them completely. Possible treatments include:   Placing a rubber band at the base of the hemorrhoid to cut off the circulation (rubber band ligation).   Injecting a chemical to shrink the hemorrhoid (sclerotherapy).   Using a tool to burn the hemorrhoid (infrared light therapy).   Surgically removing the hemorrhoid (hemorrhoidectomy).   Stapling the hemorrhoid to block blood flow to the tissue (hemorrhoid stapling).  HOME CARE INSTRUCTIONS   Eat foods with fiber, such as whole grains, beans, nuts, fruits, and vegetables. Ask your doctor about taking products with added fiber in them (fibersupplements).  Increase fluid intake. Drink enough water and fluids to keep your urine clear or pale yellow.   Exercise regularly.   Go to the bathroom when you have the urge to have a bowel movement. Do not wait.   Avoid straining to have bowel movements.   Keep the anal area dry and clean. Use wet toilet paper or moist towelettes after a bowel movement.   Medicated creams and suppositories may be used or applied as directed.   Only take over-the-counter or prescription medicines as directed by your caregiver.   Take warm sitz baths for 15-20 minutes, 3-4 times a day to ease pain and discomfort.   Place ice packs on the hemorrhoids if they are tender and swollen. Using ice packs between sitz baths may be helpful.   Put ice in a plastic bag.   Place a towel between your skin and the  bag.   Leave the ice on for 15-20 minutes, 3-4 times a day.   Do not use a donut-shaped pillow or sit on the toilet for long periods. This increases blood pooling and pain.  SEEK MEDICAL CARE IF:  You have increasing pain and swelling that is not controlled by treatment or medicine.  You have uncontrolled bleeding.  You have difficulty or you are unable to have a bowel movement.  You have pain or inflammation  outside the area of the hemorrhoids. MAKE SURE YOU:  Understand these instructions.  Will watch your condition.  Will get help right away if you are not doing well or get worse. Document Released: 06/20/2000 Document Revised: 06/09/2012 Document Reviewed: 04/27/2012 [facility] Patient Information 2015 [place], [place]. This information is not intended to replace advice given to you by your health care provider. Make sure you discuss any questions you have with your health care provider.

## 2015-02-06 NOTE — ED Notes (Signed)
Instructed not to drive home. verbzliaes understanding.

## 2015-02-06 NOTE — ED Provider Notes (Signed)
CSN: [id number]     Arrival date & time 02/06/15  1021 History   First MD Initiated Contact with Patient 02/06/15 1026     Chief Complaint  Patient presents with  . Hemorrhoids     (Consider location/radiation/quality/duration/timing/severity/associated sxs/prior Treatment) HPI Comments: Patient is a 39 year old female who presents to the emergency department with a complaint of hemorrhoids  The patient states that she has had problems with hemorrhoids for quite some time. She was initially scheduled for surgical intervention, but had some complication with the referral. She is now awaiting to be seen concerning her hemorrhoid pain. The patient states that she is quite frightened because her pain has gotten so much worse, and she has had to have a clot taken out of hemorrhoid in the past. His been no high fever reported. No excessive rectal bleeding reported. No other rectal surgeries reported.  The history is provided by the patient.    Past Medical History  Diagnosis Date  . Opiate use   . Kidney stones   . Attention deficit disorder    Past Surgical History  Procedure Laterality Date  . Therapeutic abortion    . Umbilical hernia repair  06/15/2012    Procedure: HERNIA REPAIR UMBILICAL ADULT;  Surgeon: Adin Hector, MD;  Location: [facility];  Service: General;  Laterality: N/A;  . Insertion of mesh  06/15/2012    Procedure: INSERTION OF MESH;  Surgeon: Adin Hector, MD;  Location: [facility];  Service: General;  Laterality: N/A;  . Cesarean section  2002/2004/2009/2013    x4   Family History  Problem Relation Age of Onset  . Diabetes Maternal Grandfather   . Colon cancer Neg Hx    History  Substance Use Topics  . Smoking status: Current Every Day Smoker -- 1.00 packs/day for 15 years    Types: Cigarettes  . Smokeless tobacco: Never Used  . Alcohol Use: No   OB History    Gravida Para Term Preterm AB TAB SAB Ectopic Multiple Living   '6 4 4  2 1 1   4     ' Review of  Systems  Respiratory: Positive for cough.   Genitourinary:       Rectal pain  All other systems reviewed and are negative.     Allergies  Ambien and Penicillins  Home Medications   Prior to Admission medications   Medication Sig Start Date End Date Taking? Authorizing Provider  ALPRAZolam Duanne Moron) 0.5 MG tablet Take 0.5 mg by mouth 3 (three) times daily.    Historical Provider, MD  amphetamine-dextroamphetamine (ADDERALL) 20 MG tablet Take 20 mg by mouth 3 (three) times daily.     Historical Provider, MD  Buprenorphine HCl-Naloxone HCl (SUBOXONE) 8-2 MG FILM Place 2.5 Film under the tongue daily.    Historical Provider, MD  hydrocortisone (ANUSOL-HC) 25 MG suppository Place 1 suppository (25 mg total) rectally 3 (three) times daily. 02/06/15   Lily Kocher, PA-C  hydrOXYzine (ATARAX/VISTARIL) 25 MG tablet Take 25-50 mg by mouth 3 (three) times daily. 1 tablet twice daily and 2 tablets at bedtime.    Historical Provider, MD  ibuprofen (ADVIL,MOTRIN) 800 MG tablet Take 800-1,600 mg by mouth every 8 (eight) hours as needed for moderate pain.    Historical Provider, MD  Linaclotide Rolan Lipa) 290 MCG CAPS capsule Take 1 capsule (290 mcg total) by mouth daily. 06/12/14   Mahala Menghini, PA-C  meloxicam (MOBIC) 15 MG tablet Take 1 tablet (15 mg total) by mouth daily.  02/06/15   Lily Kocher, PA-C  peg 3350 powder (MOVIPREP) 100 G SOLR Take 1 kit (200 g total) by mouth as directed. 06/12/14   Danie Binder, MD  promethazine (PHENERGAN) 25 MG tablet Take 25 mg by mouth daily as needed for nausea or vomiting.    Historical Provider, MD  traMADol (ULTRAM) 50 MG tablet Take 1 tablet (50 mg total) by mouth every 6 (six) hours as needed. 02/06/15   Lily Kocher, PA-C  tranexamic acid (LYSTEDA) 650 MG TABS tablet Take 1,300 mg by mouth 3 (three) times daily.     Historical Provider, MD  valACYclovir (VALTREX) 500 MG tablet Take 500 mg by mouth daily.    Historical Provider, MD   BP 117/84 mmHg  Pulse 90   Temp(Src) 98 F (36.7 C) (Oral)  Resp 18  Ht '5\' 11"'  (1.803 m)  Wt 220 lb (99.791 kg)  BMI 30.70 kg/m2  SpO2 99%  LMP 01/31/2015 Physical Exam  Constitutional: She is oriented to person, place, and time. She appears well-developed and well-nourished.  Non-toxic appearance.  HENT:  Head: Normocephalic.  Right Ear: Tympanic membrane and external ear normal.  Left Ear: Tympanic membrane and external ear normal.  Eyes: EOM and lids are normal. Pupils are equal, round, and reactive to light.  Neck: Normal range of motion. Neck supple. Carotid bruit is not present.  Cardiovascular: Normal rate, regular rhythm, normal heart sounds, intact distal pulses and normal pulses.   Pulmonary/Chest: Breath sounds normal. No respiratory distress.  Abdominal: Soft. Bowel sounds are normal. There is no tenderness. There is no guarding.  Genitourinary:  Chaperone present during the examination. Patient has multiple skin tags around the anal area. No fistula noted. No abscess, or red streaks appreciated. There are 2 hemorrhoids present. There is no evidence of thrombosis at this time. There is pain of the anal space after. There is a small internal hemorrhoid versus skin tag present. There is no rectal mass appreciated.  Musculoskeletal: Normal range of motion.  Lymphadenopathy:       Head (right side): No submandibular adenopathy present.       Head (left side): No submandibular adenopathy present.    She has no cervical adenopathy.  Neurological: She is alert and oriented to person, place, and time. She has normal strength. No cranial nerve deficit or sensory deficit.  Skin: Skin is warm and dry.  Psychiatric: She has a normal mood and affect. Her speech is normal.  Nursing note and vitals reviewed.   ED Course  Procedures (including critical care time) Labs Review Labs Reviewed - No data to display  Imaging Review No results found.   EKG Interpretation None      MDM  The examination favors  external hemorrhoids, possible internal hemorrhoid. The patient is advised to continue her stool softener. To use sitz bath, or warm tub soaks 2 times daily. She will use Anusol HC suppositories 3 times daily. Prescription also given for meloxicam daily and Ultram every 6 hours. The patient is to see Dr. Jonelle Sidle for assistance with pain management. Patient is to see Dr. Dalbert Batman for surgical evaluation concerning her hemorrhoids.    Final diagnoses:  External hemorrhoid    **I have reviewed nursing notes, vital signs, and all appropriate lab and imaging results for this patient.Lily Kocher, PA-C 02/06/15 1145  Elnora Morrison, MD 02/06/15 [phone]

## 2015-03-27 ENCOUNTER — Emergency Department (HOSPITAL_COMMUNITY): Payer: Medicaid Other

## 2015-03-27 ENCOUNTER — Encounter (HOSPITAL_COMMUNITY): Payer: Self-pay | Admitting: *Deleted

## 2015-03-27 ENCOUNTER — Inpatient Hospital Stay (HOSPITAL_COMMUNITY)
Admission: EM | Admit: 2015-03-27 | Discharge: 2015-03-30 | DRG: 690 | Disposition: A | Discharge status: Home / Self Care | Payer: Medicaid Other | Attending: Internal Medicine | Admitting: Internal Medicine

## 2015-03-27 DIAGNOSIS — N3000 Acute cystitis without hematuria: Secondary | ICD-10-CM | POA: Diagnosis not present

## 2015-03-27 DIAGNOSIS — K5900 Constipation, unspecified: Secondary | ICD-10-CM | POA: Diagnosis present

## 2015-03-27 DIAGNOSIS — N12 Tubulo-interstitial nephritis, not specified as acute or chronic: Secondary | ICD-10-CM | POA: Diagnosis present

## 2015-03-27 DIAGNOSIS — F988 Other specified behavioral and emotional disorders with onset usually occurring in childhood and adolescence: Secondary | ICD-10-CM | POA: Diagnosis present

## 2015-03-27 DIAGNOSIS — N390 Urinary tract infection, site not specified: Secondary | ICD-10-CM | POA: Diagnosis not present

## 2015-03-27 DIAGNOSIS — B9620 Unspecified Escherichia coli [E. coli] as the cause of diseases classified elsewhere: Secondary | ICD-10-CM | POA: Diagnosis present

## 2015-03-27 DIAGNOSIS — E876 Hypokalemia: Secondary | ICD-10-CM | POA: Diagnosis present

## 2015-03-27 DIAGNOSIS — Z79899 Other long term (current) drug therapy: Secondary | ICD-10-CM

## 2015-03-27 DIAGNOSIS — F411 Generalized anxiety disorder: Secondary | ICD-10-CM | POA: Diagnosis present

## 2015-03-27 DIAGNOSIS — N10 Acute tubulo-interstitial nephritis: Secondary | ICD-10-CM | POA: Diagnosis present

## 2015-03-27 DIAGNOSIS — N3001 Acute cystitis with hematuria: Secondary | ICD-10-CM | POA: Diagnosis not present

## 2015-03-27 DIAGNOSIS — F17210 Nicotine dependence, cigarettes, uncomplicated: Secondary | ICD-10-CM | POA: Diagnosis present

## 2015-03-27 DIAGNOSIS — Z720 Tobacco use: Secondary | ICD-10-CM | POA: Diagnosis present

## 2015-03-27 DIAGNOSIS — Z880 Allergy status to penicillin: Secondary | ICD-10-CM

## 2015-03-27 LAB — COMPREHENSIVE METABOLIC PANEL
ALT: 18 U/L (ref 14–54)
AST: 18 U/L (ref 15–41)
Albumin: 2.8 g/dL — ABNORMAL LOW (ref 3.5–5.0)
Alkaline Phosphatase: 150 U/L — ABNORMAL HIGH (ref 38–126)
Anion gap: 9 (ref 5–15)
BUN: 13 mg/dL (ref 6–20)
CO2: 26 mmol/L (ref 22–32)
Calcium: 8.2 mg/dL — ABNORMAL LOW (ref 8.9–10.3)
Chloride: 99 mmol/L — ABNORMAL LOW (ref 101–111)
Creatinine, Ser: 1.16 mg/dL — ABNORMAL HIGH (ref 0.44–1.00)
GFR calc Af Amer: >60 mL/min (ref >60)
GFR calc non Af Amer: 59 mL/min — ABNORMAL LOW (ref >60)
Glucose, Bld: 116 mg/dL — ABNORMAL HIGH (ref 65–99)
Potassium: 2.5 mmol/L — CL (ref 3.5–5.1)
Sodium: 134 mmol/L — ABNORMAL LOW (ref 135–145)
Total Bilirubin: 0.9 mg/dL (ref 0.3–1.2)
Total Protein: 6.2 g/dL — ABNORMAL LOW (ref 6.5–8.1)

## 2015-03-27 LAB — URINE MICROSCOPIC-ADD ON

## 2015-03-27 LAB — URINALYSIS, ROUTINE W REFLEX MICROSCOPIC
Bilirubin Urine: NEGATIVE
Glucose, UA: NEGATIVE mg/dL
Ketones, ur: NEGATIVE mg/dL
Nitrite: POSITIVE — AB
Protein, ur: 30 mg/dL — AB
Specific Gravity, Urine: 1.006 (ref 1.005–1.030)
Urobilinogen, UA: 2 mg/dL — ABNORMAL HIGH (ref 0.0–1.0)
pH: 6.5 (ref 5.0–8.0)

## 2015-03-27 LAB — CBC
HCT: 30.7 % — ABNORMAL LOW (ref 36.0–46.0)
Hemoglobin: 10.7 g/dL — ABNORMAL LOW (ref 12.0–15.0)
MCH: 31.3 pg (ref 26.0–34.0)
MCHC: 34.9 g/dL (ref 30.0–36.0)
MCV: 89.8 fL (ref 78.0–100.0)
Platelets: 281 10*3/uL (ref 150–400)
RBC: 3.42 MIL/uL — ABNORMAL LOW (ref 3.87–5.11)
RDW: 15.1 % (ref 11.5–15.5)
WBC: 16.8 10*3/uL — ABNORMAL HIGH (ref 4.0–10.5)

## 2015-03-27 LAB — LIPASE, BLOOD: Lipase: 12 U/L — ABNORMAL LOW (ref 22–51)

## 2015-03-27 LAB — I-STAT BETA HCG BLOOD, ED (MC, WL, AP ONLY): I-stat hCG, quantitative: 6.1 m[IU]/mL — ABNORMAL HIGH (ref <5)

## 2015-03-27 MED ORDER — HEPARIN SODIUM (PORCINE) 5000 UNIT/ML IJ SOLN
5000.0000 [IU] | Freq: Three times a day (TID) | INTRAMUSCULAR | Status: DC
  Administered 2015-03-27 – 2015-03-30 (×8): 5000 [IU] via SUBCUTANEOUS
  Filled 2015-03-27 (×12): qty 1

## 2015-03-27 MED ORDER — POTASSIUM CHLORIDE 10 MEQ/100ML IV SOLN
10.0000 meq | INTRAVENOUS | Status: AC
  Administered 2015-03-27 – 2015-03-28 (×2): 10 meq via INTRAVENOUS
  Filled 2015-03-27 (×2): qty 100

## 2015-03-27 MED ORDER — LINACLOTIDE 290 MCG PO CAPS
290.0000 ug | ORAL_CAPSULE | Freq: Every day | ORAL | Status: DC
  Administered 2015-03-28 – 2015-03-29 (×2): 290 ug via ORAL
  Filled 2015-03-27 (×4): qty 1

## 2015-03-27 MED ORDER — TRANEXAMIC ACID 650 MG PO TABS
1300.0000 mg | ORAL_TABLET | Freq: Three times a day (TID) | ORAL | Status: DC
  Administered 2015-03-28 – 2015-03-29 (×4): 1300 mg via ORAL
  Filled 2015-03-27 (×11): qty 2

## 2015-03-27 MED ORDER — FENTANYL CITRATE (PF) 100 MCG/2ML IJ SOLN
50.0000 ug | Freq: Once | INTRAMUSCULAR | Status: AC
  Administered 2015-03-27: 50 ug via INTRAVENOUS
  Filled 2015-03-27: qty 2

## 2015-03-27 MED ORDER — HYDROMORPHONE HCL 1 MG/ML IJ SOLN
1.0000 mg | Freq: Once | INTRAMUSCULAR | Status: AC
  Administered 2015-03-27: 1 mg via INTRAVENOUS
  Filled 2015-03-27: qty 1

## 2015-03-27 MED ORDER — ALPRAZOLAM 0.5 MG PO TABS
0.5000 mg | ORAL_TABLET | Freq: Three times a day (TID) | ORAL | Status: DC
  Administered 2015-03-28 – 2015-03-30 (×7): 0.5 mg via ORAL
  Filled 2015-03-27 (×7): qty 1

## 2015-03-27 MED ORDER — DEXTROSE 5 % IV SOLN
1.0000 g | INTRAVENOUS | Status: DC
  Administered 2015-03-28 – 2015-03-29 (×2): 1 g via INTRAVENOUS
  Filled 2015-03-27 (×2): qty 10

## 2015-03-27 MED ORDER — POTASSIUM CHLORIDE 10 MEQ/100ML IV SOLN
10.0000 meq | Freq: Once | INTRAVENOUS | Status: AC
  Administered 2015-03-27: 10 meq via INTRAVENOUS
  Filled 2015-03-27: qty 100

## 2015-03-27 MED ORDER — SODIUM CHLORIDE 0.9 % IV BOLUS (SEPSIS)
1000.0000 mL | Freq: Once | INTRAVENOUS | Status: AC
  Administered 2015-03-27: 1000 mL via INTRAVENOUS

## 2015-03-27 MED ORDER — NICOTINE 21 MG/24HR TD PT24
21.0000 mg | MEDICATED_PATCH | Freq: Every day | TRANSDERMAL | Status: DC
  Administered 2015-03-28 – 2015-03-30 (×3): 21 mg via TRANSDERMAL
  Filled 2015-03-27 (×3): qty 1

## 2015-03-27 MED ORDER — HYDROMORPHONE HCL 1 MG/ML IJ SOLN
1.0000 mg | INTRAMUSCULAR | Status: DC | PRN
  Administered 2015-03-28: 1 mg via INTRAVENOUS
  Filled 2015-03-27 (×2): qty 1

## 2015-03-27 MED ORDER — IOHEXOL 300 MG/ML  SOLN
25.0000 mL | Freq: Once | INTRAMUSCULAR | Status: AC | PRN
  Administered 2015-03-27: 25 mL via ORAL

## 2015-03-27 MED ORDER — PROMETHAZINE HCL 25 MG PO TABS: 25.0000 mg | ORAL_TABLET | Freq: Every day | ORAL | Status: DC | PRN

## 2015-03-27 MED ORDER — VALACYCLOVIR HCL 500 MG PO TABS
500.0000 mg | ORAL_TABLET | Freq: Every day | ORAL | Status: DC
  Administered 2015-03-28 – 2015-03-30 (×3): 500 mg via ORAL
  Filled 2015-03-27 (×4): qty 1

## 2015-03-27 MED ORDER — ONDANSETRON HCL 4 MG/2ML IJ SOLN
4.0000 mg | Freq: Once | INTRAMUSCULAR | Status: AC
  Administered 2015-03-27: 4 mg via INTRAVENOUS
  Filled 2015-03-27: qty 2

## 2015-03-27 MED ORDER — CEFTRIAXONE SODIUM 1 G IJ SOLR
1.0000 g | Freq: Once | INTRAMUSCULAR | Status: AC
  Administered 2015-03-27: 1 g via INTRAVENOUS
  Filled 2015-03-27: qty 10

## 2015-03-27 MED ORDER — POTASSIUM CHLORIDE CRYS ER 20 MEQ PO TBCR
40.0000 meq | EXTENDED_RELEASE_TABLET | Freq: Two times a day (BID) | ORAL | Status: DC
  Administered 2015-03-28: 40 meq via ORAL
  Filled 2015-03-27 (×2): qty 2

## 2015-03-27 MED ORDER — SODIUM CHLORIDE 0.9 % IV SOLN
INTRAVENOUS | Status: DC
  Administered 2015-03-27: 23:00:00 via INTRAVENOUS

## 2015-03-27 MED ORDER — IOHEXOL 300 MG/ML  SOLN
100.0000 mL | Freq: Once | INTRAMUSCULAR | Status: AC | PRN
  Administered 2015-03-27: 100 mL via INTRAVENOUS

## 2015-03-27 NOTE — ED Notes (Signed)
MD at bedside. 

## 2015-03-27 NOTE — ED Provider Notes (Signed)
39 year old female, multiple abdominal surgeries cesarean section surgery. Presents to the hospital with abdominal distention and infrequent bowel movements. She states it is not unusual for her to go more than a week without a bowel movement but over the last 3 weeks has been particularly unsuccessful, she had to use an enema the other night to have anything come out, she often tries to self disimpact with her finger. On exam the patient has a soft abdomen which is minimally tender on the right side, there is no other abdominal tenderness but she is distended. She otherwise appears well, we'll obtain imaging to rule out bowel obstruction or other signs of intra-abdominal pathology.  Medical screening examination/treatment/procedure(s) were conducted as a shared visit with non-physician practitioner(s) and myself.  I personally evaluated the patient during the encounter.  Clinical Impression:   Final diagnoses:  Pyelonephritis         Eber Hong, MD 03/30/15 [phone]

## 2015-03-27 NOTE — ED Notes (Signed)
Patient has multiple complaints with the primary being abdominal distention, pain, and constipation. Patient states she has tried 3 enemas with little to no results. She is not passing gas from her rectum. Patient takes a prescription medication of Linzess for constipation due to chronic constipation without relief. She has been drinking plenty of fluids but feels very dry and dizzy.

## 2015-03-27 NOTE — ED Notes (Signed)
Abnormal lab given to RN in triage

## 2015-03-27 NOTE — H&P (Signed)
[facility] History and Physical  Misty Young ZOX:[id number] DOB: 11/22/1975 DOA: 03/27/2015  Referring physician: EDP PCP: Lonia Blood, MD   Chief Complaint: Abd pain, flank pain   HPI: Misty Young is a 39 y.o. female who presents to the ED with c/o 2 day history of N/V, abdominal pain, flank pain, constipation.  Symptoms are severe, worsening.  Has tried laxitives and enemas without effect.  Workup in ED demonstrates pyelonephritis.  Review of Systems: Systems reviewed.  As above, otherwise negative  Past Medical History  Diagnosis Date  . Opiate use   . Kidney stones   . Attention deficit disorder    Past Surgical History  Procedure Laterality Date  . Therapeutic abortion    . Umbilical hernia repair  06/15/2012    Procedure: HERNIA REPAIR UMBILICAL ADULT;  Surgeon: Ernestene Mention, MD;  Location: [facility];  Service: General;  Laterality: N/A;  . Insertion of mesh  06/15/2012    Procedure: INSERTION OF MESH;  Surgeon: Ernestene Mention, MD;  Location: [facility];  Service: General;  Laterality: N/A;  . Cesarean section  2002/2004/2009/2013    x4   Social History:  reports that she has been smoking Cigarettes.  She has a 15 pack-year smoking history. She has never used smokeless tobacco. She reports that she does not drink alcohol or use illicit drugs.  Allergies  Allergen Reactions  . Penicillins Hives    Pt states my mom told me about it, she said when i was a child i got hives when i took it.  Tolerates cephalosporins     Family History  Problem Relation Age of Onset  . Diabetes Maternal Grandfather   . Colon cancer Neg Hx      Prior to Admission medications   Medication Sig Start Date End Date Taking? Authorizing Provider  ALPRAZolam Prudy Feeler) 0.5 MG tablet Take 0.5 mg by mouth 3 (three) times daily.   Yes Historical Provider, MD  buprenorphine-naloxone (SUBOXONE) 8-2 MG SUBL SL tablet Place 1 tablet under the tongue daily as needed (withdrawal  symptoms).   Yes Historical Provider, MD  ibuprofen (ADVIL,MOTRIN) 800 MG tablet Take 800-1,600 mg by mouth every 8 (eight) hours as needed for moderate pain.   Yes Historical Provider, MD  Linaclotide (LINZESS) 290 MCG CAPS capsule Take 1 capsule (290 mcg total) by mouth daily. 06/12/14  Yes Tiffany Kocher, PA-C  promethazine (PHENERGAN) 25 MG tablet Take 25 mg by mouth daily as needed for nausea or vomiting.   Yes Historical Provider, MD  tranexamic acid (LYSTEDA) 650 MG TABS tablet Take 1,300 mg by mouth 3 (three) times daily. Only during menstrual cycle- 5 days   Yes Historical Provider, MD  valACYclovir (VALTREX) 500 MG tablet Take 500 mg by mouth daily.   Yes Historical Provider, MD   Physical Exam: Filed Vitals:   03/27/15 2003  BP: 118/72  Pulse: 108  Temp:   Resp: 28    BP 118/72 mmHg  Pulse 108  Temp(Src) 98.3 F (36.8 C) (Oral)  Resp 28  SpO2 97%  LMP 03/16/2015  General Appearance:    Alert, oriented, no distress, appears stated age  Head:    Normocephalic, atraumatic  Eyes:    PERRL, EOMI, sclera non-icteric        Nose:   Nares without drainage or epistaxis. Mucosa, turbinates normal  Throat:   Moist mucous membranes. Oropharynx without erythema or exudate.  Neck:   Supple. No carotid bruits.  No thyromegaly.  No lymphadenopathy.   Back:     No CVA tenderness, no spinal tenderness  Lungs:     Clear to auscultation bilaterally, without wheezes, rhonchi or rales  Chest wall:    No tenderness to palpitation  Heart:    Regular rate and rhythm without murmurs, gallops, rubs  Abdomen:     Soft, non-tender, nondistended, normal bowel sounds, no organomegaly  Genitalia:    deferred  Rectal:    deferred  Extremities:   No clubbing, cyanosis or edema.  Pulses:   2+ and symmetric all extremities  Skin:   Skin color, texture, turgor normal, no rashes or lesions  Lymph nodes:   Cervical, supraclavicular, and axillary nodes normal  Neurologic:   CNII-XII intact. Normal  strength, sensation and reflexes      throughout    Labs on Admission:  Basic Metabolic Panel:  Recent Labs Lab 03/27/15 1445  NA 134*  K 2.5*  CL 99*  CO2 26  GLUCOSE 116*  BUN 13  CREATININE 1.16*  CALCIUM 8.2*   Liver Function Tests:  Recent Labs Lab 03/27/15 1445  AST 18  ALT 18  ALKPHOS 150*  BILITOT 0.9  PROT 6.2*  ALBUMIN 2.8*    Recent Labs Lab 03/27/15 1445  LIPASE 12*   No results for input(s): AMMONIA in the last 168 hours. CBC:  Recent Labs Lab 03/27/15 1445  WBC 16.8*  HGB 10.7*  HCT 30.7*  MCV 89.8  PLT 281   Cardiac Enzymes: No results for input(s): CKTOTAL, CKMB, CKMBINDEX, TROPONINI in the last 168 hours.  BNP (last 3 results) No results for input(s): PROBNP in the last 8760 hours. CBG: No results for input(s): GLUCAP in the last 168 hours.  Radiological Exams on Admission: Ct Abdomen Pelvis W Contrast  03/27/2015   CLINICAL DATA:  Abdominal distention, pain and constipation, little results with 3 enemas, not passing gas from rectum smoker  EXAM: CT ABDOMEN AND PELVIS WITH CONTRAST  TECHNIQUE: Multidetector CT imaging of the abdomen and pelvis was performed using the standard protocol following bolus administration of intravenous contrast. Sagittal and coronal MPR images reconstructed from axial data set.  CONTRAST:  OMNIPAQUE IOHEXOL 300 MG/ML SOLN IV. Dilute oral contrast.  COMPARISON:  07/29/2013  FINDINGS: Minimal dependent atelectasis at lung bases.  Liver, spleen, pancreas, LEFT kidney, and adrenal glands normal appearance.  RIGHT kidney is enlarged versus previous exam with mild perinephric edema.  Tiny 9 mm focus of lower attenuation laterally likely tiny cyst unchanged.  Minimal enhancement of the walls of the renal pelvis.  No hydronephrosis, calcification or additional mass lesion.  Findings are concerning for infection.  RIGHT renal artery and vein appear patent.  Large collateral vessels seen in the LEFT upper quadrant,  extending from portal vein to LEFT renal vein compatible with spontaneous portosystemic shunt.  Stomach and bowel loops normal appearance.  No significant retained stool burden identified.  Normal appendix and gallbladder.  Prior tubal ligation.  Bladder, ureters, uterus and adnexa unremarkable.  No mass, adenopathy, free air or free fluid.  Lax anterior abdominal wall fascia without discrete hernia.  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  IMPRESSION: Enlarged edematous RIGHT kidney versus prior exam without gross mass or hydronephrosis, raising question of pyelonephritis ; correlation with urinalysis recommended.  Single tiny 9 mm cyst RIGHT kidney appears grossly unchanged.  No urinary tract calcification identified.  Large collateral in LEFT upper quadrant compatible with spontaneous portosystemic shunt.   Electronically Signed   By: Loraine Leriche  Tyron Russell M.D.   On: 03/27/2015 20:12    EKG: Independently reviewed.  Assessment/Plan Principal Problem:   Pyelonephritis Active Problems:   Smoking trying to quit   Hypokalemia   1. Pyelonephritis - 1. Rocephin 2. Cultures pending 3. Repeat CBCin AM 2. Hypokalemia - replacing, repeat BMP in AM 3. Smoking trying to quit - 1. Nicotine patch 2. Needs nicotine patch on discharge    Code Status: Full Code  Family Communication: Husband at bedside Disposition Plan: Admit to inpatient   Time spent: 70 min  GARDNER, JARED M. [facility] Pager [phone]  If 7AM-7PM, please contact the day team taking care of the patient [email or web address] Password [email or web address] 03/27/2015, 9:25 PM

## 2015-03-27 NOTE — ED Notes (Signed)
PA at bedside.

## 2015-03-27 NOTE — ED Provider Notes (Signed)
CSN: [id number]     Arrival date & time 03/27/15  1337 History   First MD Initiated Contact with Patient 03/27/15 1605     Chief Complaint  Patient presents with  . Abdominal Pain  . Constipation  . Bloated     (Consider location/radiation/quality/duration/timing/severity/associated sxs/prior Treatment) HPI Misty Young is a 39 y.o. female with a history of nephrolithiasis with lithotripsy, 4 C-sections, tubal ligation, chronic constipation, comes in for evaluation of constipation and abdominal pain. Patient states for the past 3 days she has had increased constipation. She has used 3 enemas with only some stool output. She reports associated right upper quadrant pain that has been constant and persistent for the past 3 days. This pain is dissimilar to previous kidney stones. Also reports abdominal distention 2 times greater than normal. Reports associated nausea and vomiting (mostly water and stomach contents). Patient reports she has had increased water intake over the past 3 days. States she takes linzess for her chronic constipation. Denies fevers, chills, urinary symptoms, pelvic pain, vaginal bleeding or discharge.  Past Medical History  Diagnosis Date  . Opiate use   . Kidney stones   . Attention deficit disorder    Past Surgical History  Procedure Laterality Date  . Therapeutic abortion    . Umbilical hernia repair  06/15/2012    Procedure: HERNIA REPAIR UMBILICAL ADULT;  Surgeon: Ernestene Mention, MD;  Location: [facility];  Service: General;  Laterality: N/A;  . Insertion of mesh  06/15/2012    Procedure: INSERTION OF MESH;  Surgeon: Ernestene Mention, MD;  Location: [facility];  Service: General;  Laterality: N/A;  . Cesarean section  2002/2004/2009/2013    x4   Family History  Problem Relation Age of Onset  . Diabetes Maternal Grandfather   . Colon cancer Neg Hx    Social History  Substance Use Topics  . Smoking status: Current Every Day Smoker -- 1.00 packs/day for 15  years    Types: Cigarettes  . Smokeless tobacco: Never Used  . Alcohol Use: No   OB History    Gravida Para Term Preterm AB TAB SAB Ectopic Multiple Living   Review of Systems A 10 point review of systems was completed and was negative except for pertinent positives and negatives as mentioned in the history of present illness     Allergies  Penicillins  Home Medications   Prior to Admission medications   Medication Sig Start Date End Date Taking? Authorizing Provider  ALPRAZolam Prudy Feeler) 0.5 MG tablet Take 0.5 mg by mouth 3 (three) times daily.   Yes Historical Provider, MD  buprenorphine-naloxone (SUBOXONE) 8-2 MG SUBL SL tablet Place 1 tablet under the tongue daily as needed (withdrawal symptoms).   Yes Historical Provider, MD  ibuprofen (ADVIL,MOTRIN) 800 MG tablet Take 800-1,600 mg by mouth every 8 (eight) hours as needed for moderate pain.   Yes Historical Provider, MD  Linaclotide (LINZESS) 290 MCG CAPS capsule Take 1 capsule (290 mcg total) by mouth daily. 06/12/14  Yes Tiffany Kocher, PA-C  promethazine (PHENERGAN) 25 MG tablet Take 25 mg by mouth daily as needed for nausea or vomiting.   Yes Historical Provider, MD  tranexamic acid (LYSTEDA) 650 MG TABS tablet Take 1,300 mg by mouth 3 (three) times daily. Only during menstrual cycle- 5 days   Yes Historical Provider, MD  valACYclovir (VALTREX) 500 MG tablet Take 500 mg by mouth  daily.   Yes Historical Provider, MD   BP 118/71 mmHg  Pulse 114  Temp(Src) 100.2 F (37.9 C) (Oral)  Resp 18  Ht  (1.803 m)  Wt 222 lb (100.699 kg)  BMI 30.98 kg/m2  SpO2 99%  LMP 03/16/2015 Physical Exam  Constitutional: She is oriented to person, place, and time. She appears well-developed and well-nourished.  Patient appears uncomfortable and is tearful.  HENT:  Head: Normocephalic and atraumatic.  Mouth/Throat: Oropharynx is clear and moist.  Eyes: Conjunctivae are normal. Pupils are equal, round, and  reactive to light. Right eye exhibits no discharge. Left eye exhibits no discharge. No scleral icterus.  Neck: Neck supple.  Cardiovascular: Normal rate, regular rhythm and normal heart sounds.   Pulmonary/Chest: Effort normal and breath sounds normal. No respiratory distress. She has no wheezes. She has no rales.  Abdominal: Soft.  Patient with market abdominal distention. However abdomen remained soft, no rigidity. Tenderness to palpation in right upper quadrant without rebound or guarding. No other lesions or deformities.  Musculoskeletal: She exhibits no tenderness.  Neurological: She is alert and oriented to person, place, and time.  Cranial Nerves II-XII grossly intact  Skin: Skin is warm and dry. No rash noted.  Psychiatric: She has a normal mood and affect.  Nursing note and vitals reviewed.   ED Course  Procedures (including critical care time) Labs Review Labs Reviewed  LIPASE, BLOOD - Abnormal; Notable for the following:    Lipase 12 (*)    All other components within normal limits  COMPREHENSIVE METABOLIC PANEL - Abnormal; Notable for the following:    Sodium 134 (*)    Potassium 2.5 (*)    Chloride 99 (*)    Glucose, Bld 116 (*)    Creatinine, Ser 1.16 (*)    Calcium 8.2 (*)    Total Protein 6.2 (*)    Albumin 2.8 (*)    Alkaline Phosphatase 150 (*)    GFR calc non Af Amer 59 (*)    All other components within normal limits  CBC - Abnormal; Notable for the following:    WBC 16.8 (*)    RBC 3.42 (*)    Hemoglobin 10.7 (*)    HCT 30.7 (*)    All other components within normal limits  URINALYSIS, ROUTINE W REFLEX MICROSCOPIC (NOT AT [facility]) - Abnormal; Notable for the following:    APPearance CLOUDY (*)    Hgb urine dipstick SMALL (*)    Protein, ur 30 (*)    Urobilinogen, UA 2.0 (*)    Nitrite POSITIVE (*)    Leukocytes, UA SMALL (*)    All other components within normal limits  URINE MICROSCOPIC-ADD ON - Abnormal; Notable for the following:    Bacteria, UA  MANY (*)    All other components within normal limits  I-STAT BETA HCG BLOOD, ED ([facility], [facility], [facility] ONLY) - Abnormal; Notable for the following:    I-stat hCG, quantitative 6.1 (*)    All other components within normal limits  URINE CULTURE  CBC  BASIC METABOLIC PANEL    Imaging Review Ct Abdomen Pelvis W Contrast  03/27/2015   CLINICAL DATA:  Abdominal distention, pain and constipation, little results with 3 enemas, not passing gas from rectum smoker  EXAM: CT ABDOMEN AND PELVIS WITH CONTRAST  TECHNIQUE: Multidetector CT imaging of the abdomen and pelvis was performed using the standard protocol following bolus administration of intravenous contrast. Sagittal and coronal MPR images reconstructed from axial data set.  CONTRAST:  OMNIPAQUE IOHEXOL 300 MG/ML SOLN IV. Dilute oral contrast.  COMPARISON:  07/29/2013  FINDINGS: Minimal dependent atelectasis at lung bases.  Liver, spleen, pancreas, LEFT kidney, and adrenal glands normal appearance.  RIGHT kidney is enlarged versus previous exam with mild perinephric edema.  Tiny 9 mm focus of lower attenuation laterally likely tiny cyst unchanged.  Minimal enhancement of the walls of the renal pelvis.  No hydronephrosis, calcification or additional mass lesion.  Findings are concerning for infection.  RIGHT renal artery and vein appear patent.  Large collateral vessels seen in the LEFT upper quadrant, extending from portal vein to LEFT renal vein compatible with spontaneous portosystemic shunt.  Stomach and bowel loops normal appearance.  No significant retained stool burden identified.  Normal appendix and gallbladder.  Prior tubal ligation.  Bladder, ureters, uterus and adnexa unremarkable.  No mass, adenopathy, free air or free fluid.  Lax anterior abdominal wall fascia without discrete hernia.  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  IMPRESSION: Enlarged edematous RIGHT kidney versus prior exam without gross mass or hydronephrosis, raising  question of pyelonephritis ; correlation with urinalysis recommended.  Single tiny 9 mm cyst RIGHT kidney appears grossly unchanged.  No urinary tract calcification identified.  Large collateral in LEFT upper quadrant compatible with spontaneous portosystemic shunt.   Electronically Signed   By: Ulyses Southward M.D.   On: 03/27/2015 20:12   I have personally reviewed and evaluated these images and lab results as part of my medical decision-making.   EKG Interpretation None     Meds given in ED:  Medications  potassium chloride 10 mEq in 100 mL IVPB (10 mEq Intravenous Given 03/28/15 0012)  potassium chloride SA (K-DUR,KLOR-CON) CR tablet 40 mEq (40 mEq Oral Given 03/28/15 0018)  HYDROmorphone (DILAUDID) injection 1 mg (not administered)  valACYclovir (VALTREX) tablet 500 mg (500 mg Oral Not Given 03/27/15 2259)  promethazine (PHENERGAN) tablet 25 mg (not administered)  tranexamic acid (LYSTEDA) tablet 1,300 mg (1,300 mg Oral Not Given 03/27/15 2259)  Linaclotide (LINZESS) capsule 290 mcg (290 mcg Oral Not Given 03/27/15 2259)  ALPRAZolam (XANAX) tablet 0.5 mg (not administered)  cefTRIAXone (ROCEPHIN) 1 g in dextrose 5 % 50 mL IVPB (not administered)  nicotine (NICODERM CQ - dosed in mg/24 hours) patch 21 mg (not administered)  0.9 %  sodium chloride infusion ( Intravenous New Bag/Given 03/27/15 2258)  heparin injection 5,000 Units (5,000 Units Subcutaneous Given 03/27/15 2257)  fentaNYL (SUBLIMAZE) injection 50 mcg (50 mcg Intravenous Given 03/27/15 1707)  sodium chloride 0.9 % bolus 1,000 mL (0 mLs Intravenous Stopped 03/27/15 1821)  ondansetron (ZOFRAN) injection 4 mg (4 mg Intravenous Given 03/27/15 1707)  cefTRIAXone (ROCEPHIN) 1 g in dextrose 5 % 50 mL IVPB (0 g Intravenous Stopped 03/27/15 1821)  iohexol (OMNIPAQUE) 300 MG/ML solution 25 mL (25 mLs Oral Contrast Given 03/27/15 1650)  iohexol (OMNIPAQUE) 300 MG/ML solution 100 mL (100 mLs Intravenous Contrast Given 03/27/15 1924)  fentaNYL  (SUBLIMAZE) injection 50 mcg (50 mcg Intravenous Given 03/27/15 1854)  HYDROmorphone (DILAUDID) injection 1 mg (1 mg Intravenous Given 03/27/15 2129)  potassium chloride 10 mEq in 100 mL IVPB (10 mEq Intravenous Transfusing/Transfer 03/27/15 2159)    Current Discharge Medication List     Filed Vitals:   03/27/15 2003 03/27/15 2030 03/27/15 2126 03/27/15 2215  BP: 118/72 103/58 119/56 118/71  Pulse: 108 109 104 114  Temp:    100.2 F (37.9 C)  TempSrc:    Oral  Resp: 18  Height:    5\' 11"  (1.803 m)  Weight:    222 lb (100.699 kg)  SpO2: 97% 96% 95% 99%    MDM  Patient presents for evaluation of abdominal pain, right flank pain, constipation without flatus, abdominal distention, nausea and vomiting for the past 3 days. Patient is tachycardic, appears distressed and is tearful on arrival. On exam, patient has diffuse tenderness throughout right flank as well as CVA tenderness. Labs are significant for a leukocytosis of 16.8, creatinine is 1.16 up from baseline of 0.8, potassium of 2.5, given 10 mEq IV. EKG pending. Alkaline phosphatase is also elevated at 150, however no clear biliary etiology. Patient's i-STAT beta hCG is 6.1, although the patient reports she has had bilateral tubal ligation. Will obtain CT abdomen for further appreciation of pyelonephritis versus bowel obstruction. CT abdomen shows an enlarged, edematous right kidney without gross mass or hydronephrosis that raises the question of pyelonephritis. Patient started on 1 g of Rocephin in the ED, urine culture obtained. Will need admission for IV antibiotics. Patient admitted Prior to patient admission, I discussed and reviewed this case with my attending, Dr. Hyacinth Meeker. Final diagnoses:  Pyelonephritis        Joycie Peek, PA-C 03/28/15 1610  Eber Hong, MD 03/30/15 [phone]

## 2015-03-28 DIAGNOSIS — B9620 Unspecified Escherichia coli [E. coli] as the cause of diseases classified elsewhere: Secondary | ICD-10-CM | POA: Diagnosis present

## 2015-03-28 DIAGNOSIS — N3000 Acute cystitis without hematuria: Secondary | ICD-10-CM

## 2015-03-28 DIAGNOSIS — F411 Generalized anxiety disorder: Secondary | ICD-10-CM

## 2015-03-28 DIAGNOSIS — E876 Hypokalemia: Secondary | ICD-10-CM

## 2015-03-28 DIAGNOSIS — N12 Tubulo-interstitial nephritis, not specified as acute or chronic: Secondary | ICD-10-CM

## 2015-03-28 DIAGNOSIS — N390 Urinary tract infection, site not specified: Secondary | ICD-10-CM | POA: Diagnosis present

## 2015-03-28 LAB — BASIC METABOLIC PANEL
Anion gap: 8 (ref 5–15)
Anion gap: 6 (ref 5–15)
BUN: 10 mg/dL (ref 6–20)
BUN: 10 mg/dL (ref 6–20)
CO2: 27 mmol/L (ref 22–32)
CO2: 22 mmol/L (ref 22–32)
Calcium: 8 mg/dL — ABNORMAL LOW (ref 8.9–10.3)
Calcium: 8 mg/dL — ABNORMAL LOW (ref 8.9–10.3)
Chloride: 102 mmol/L (ref 101–111)
Chloride: 108 mmol/L (ref 101–111)
Creatinine, Ser: 1.34 mg/dL — ABNORMAL HIGH (ref 0.44–1.00)
Creatinine, Ser: 1.26 mg/dL — ABNORMAL HIGH (ref 0.44–1.00)
GFR calc Af Amer: 58 mL/min — ABNORMAL LOW (ref >60)
GFR calc Af Amer: >60 mL/min (ref >60)
GFR calc non Af Amer: 50 mL/min — ABNORMAL LOW (ref >60)
GFR calc non Af Amer: 54 mL/min — ABNORMAL LOW (ref >60)
Glucose, Bld: 115 mg/dL — ABNORMAL HIGH (ref 65–99)
Glucose, Bld: 130 mg/dL — ABNORMAL HIGH (ref 65–99)
Potassium: 2.6 mmol/L — CL (ref 3.5–5.1)
Potassium: 3.6 mmol/L (ref 3.5–5.1)
Sodium: 137 mmol/L (ref 135–145)
Sodium: 136 mmol/L (ref 135–145)

## 2015-03-28 LAB — CBC
HCT: 28.7 % — ABNORMAL LOW (ref 36.0–46.0)
Hemoglobin: 10.2 g/dL — ABNORMAL LOW (ref 12.0–15.0)
MCH: 31.9 pg (ref 26.0–34.0)
MCHC: 35.5 g/dL (ref 30.0–36.0)
MCV: 89.7 fL (ref 78.0–100.0)
Platelets: 290 10*3/uL (ref 150–400)
RBC: 3.2 MIL/uL — ABNORMAL LOW (ref 3.87–5.11)
RDW: 15.4 % (ref 11.5–15.5)
WBC: 15.7 10*3/uL — ABNORMAL HIGH (ref 4.0–10.5)

## 2015-03-28 LAB — RAPID URINE DRUG SCREEN, HOSP PERFORMED
Amphetamines: NOT DETECTED
Barbiturates: NOT DETECTED
Benzodiazepines: POSITIVE — AB
Cocaine: NOT DETECTED
Opiates: POSITIVE — AB
Tetrahydrocannabinol: POSITIVE — AB

## 2015-03-28 LAB — MRSA PCR SCREENING: MRSA by PCR: NEGATIVE

## 2015-03-28 LAB — MAGNESIUM: Magnesium: 1.7 mg/dL (ref 1.7–2.4)

## 2015-03-28 MED ORDER — ACETAMINOPHEN-CODEINE #3 300-30 MG PO TABS
1.0000 | ORAL_TABLET | Freq: Four times a day (QID) | ORAL | Status: DC | PRN
  Administered 2015-03-28 (×2): 1 via ORAL
  Administered 2015-03-29 – 2015-03-30 (×4): 2 via ORAL
  Filled 2015-03-28 (×2): qty 2
  Filled 2015-03-28: qty 1
  Filled 2015-03-28 (×2): qty 2
  Filled 2015-03-28: qty 1

## 2015-03-28 MED ORDER — POTASSIUM CHLORIDE 10 MEQ/100ML IV SOLN
10.0000 meq | INTRAVENOUS | Status: DC
  Administered 2015-03-28 (×2): 10 meq via INTRAVENOUS
  Filled 2015-03-28 (×5): qty 100

## 2015-03-28 MED ORDER — POTASSIUM CHLORIDE CRYS ER 20 MEQ PO TBCR
40.0000 meq | EXTENDED_RELEASE_TABLET | ORAL | Status: AC
  Administered 2015-03-28 (×3): 40 meq via ORAL
  Filled 2015-03-28 (×3): qty 2

## 2015-03-28 MED ORDER — MAGNESIUM SULFATE 2 GM/50ML IV SOLN
2.0000 g | Freq: Once | INTRAVENOUS | Status: AC
  Administered 2015-03-28: 2 g via INTRAVENOUS
  Filled 2015-03-28: qty 50

## 2015-03-28 MED ORDER — SODIUM CHLORIDE 0.9 % IV SOLN
INTRAVENOUS | Status: DC
  Administered 2015-03-28 – 2015-03-29 (×5): via INTRAVENOUS
  Filled 2015-03-28 (×11): qty 1000

## 2015-03-28 MED ORDER — OXYCODONE-ACETAMINOPHEN 5-325 MG PO TABS
1.0000 | ORAL_TABLET | ORAL | Status: DC | PRN
Start: 2015-03-28 — End: 2015-03-28
  Administered 2015-03-28: 1 via ORAL
  Filled 2015-03-28 (×2): qty 1

## 2015-03-28 MED ORDER — TRAMADOL HCL 50 MG PO TABS
100.0000 mg | ORAL_TABLET | Freq: Four times a day (QID) | ORAL | Status: DC | PRN
  Filled 2015-03-28: qty 2

## 2015-03-28 NOTE — Progress Notes (Signed)
[facility] PROGRESS NOTE  Misty Young ZOX:[id number] DOB: [birth year] DOA: 03/27/2015 PCP: Lonia Blood, MD  Assessment/Plan: #1 acute pyelonephritis Patient presented with nausea vomiting abdominal pain and per CT scan concerns for pyelonephritis. Urinalysis consistent with a UTI. Patient with clinical improvement. Urine cultures are pending. Continue IV Rocephin, IV fluids, supportive care, pain management with Tylenol No. 3 and Ultram. Follow.  #2 UTI Urine cultures pending. Continue IV Rocephin.  #3 hypokalemia Likely secondary to GI losses. Keep magnesium greater than 2. Replete.  #4 tobacco abuse Tobacco cessation. Continue nicotine patch.  #5 generalized anxiety disorder Continue home regimen of anxiolytics.  #6 prophylaxis  Heparin for DVT prophylaxis.  Code Status: Full Family Communication: Updated patient and husband at bedside. Disposition Plan: Home when medically stable in1 to 2 days.   Consultants:  None  Procedures:  CT abdomen and pelvis 03/27/2015  Antibiotics:  IV Rocephin 03/27/2015  HPI/Subjective: Patient states feeling a little bit better than she did on admission. Patient denies any nausea or vomiting. Patient states abdominal pain has improved.   Objective: Filed Vitals:   03/28/15 0420  BP: 112/68  Pulse: 108  Temp: 99.6 F (37.6 C)  Resp: 16    Intake/Output Summary (Last 24 hours) at 03/28/15 1141 Last data filed at 03/28/15 0525  Gross per 24 hour  Intake   2430 ml  Output    620 ml  Net   1810 ml   Filed Weights   03/27/15 2215  Weight: 100.699 kg (222 lb)    Exam:   General:  NAD.Drowsy  Cardiovascular: Regular rate rhythm no murmurs rubs or gallops  Respiratory: Clear to rotation bilaterally. No wheezes, no crackles, no rhonchi  Abdomen: Soft, nontender, nondistended, positive bowel sounds.  Musculoskeletal: No clubbing cyanosis or edema.  Data Reviewed: Basic Metabolic Panel:  Recent Labs Lab  03/27/15 1445 03/28/15 0425  NA 134* 137  K 2.5* 2.6*  CL 99* 102  CO2 26 27  GLUCOSE 116* 115*  BUN 13 10  CREATININE 1.16* 1.34*  CALCIUM 8.2* 8.0*  MG  --  1.7   Liver Function Tests:  Recent Labs Lab 03/27/15 1445  AST 18  ALT 18  ALKPHOS 150*  BILITOT 0.9  PROT 6.2*  ALBUMIN 2.8*    Recent Labs Lab 03/27/15 1445  LIPASE 12*   No results for input(s): AMMONIA in the last 168 hours. CBC:  Recent Labs Lab 03/27/15 1445 03/28/15 0425  WBC 16.8* 15.7*  HGB 10.7* 10.2*  HCT 30.7* 28.7*  MCV 89.8 89.7  PLT 281 290   Cardiac Enzymes: No results for input(s): CKTOTAL, CKMB, CKMBINDEX, TROPONINI in the last 168 hours. BNP (last 3 results) No results for input(s): BNP in the last 8760 hours.  ProBNP (last 3 results) No results for input(s): PROBNP in the last 8760 hours.  CBG: No results for input(s): GLUCAP in the last 168 hours.  Recent Results (from the past 240 hour(s))  Urine culture     Status: None (Preliminary result)   Collection Time: 03/27/15  4:14 PM  Result Value Ref Range Status   Specimen Description URINE, RANDOM  Final   Special Requests NONE  Final   Culture   Final    TOO YOUNG TO READ Performed at [facility]    Report Status PENDING  Incomplete     Studies: Ct Abdomen Pelvis W Contrast  03/27/2015   CLINICAL DATA:  Abdominal distention, pain and constipation, little results with 3 enemas, not  passing gas from rectum smoker  EXAM: CT ABDOMEN AND PELVIS WITH CONTRAST  TECHNIQUE: Multidetector CT imaging of the abdomen and pelvis was performed using the standard protocol following bolus administration of intravenous contrast. Sagittal and coronal MPR images reconstructed from axial data set.  CONTRAST:  OMNIPAQUE IOHEXOL 300 MG/ML SOLN IV. Dilute oral contrast.  COMPARISON:  07/29/2013  FINDINGS: Minimal dependent atelectasis at lung bases.  Liver, spleen, pancreas, LEFT kidney, and adrenal glands normal appearance.   RIGHT kidney is enlarged versus previous exam with mild perinephric edema.  Tiny 9 mm focus of lower attenuation laterally likely tiny cyst unchanged.  Minimal enhancement of the walls of the renal pelvis.  No hydronephrosis, calcification or additional mass lesion.  Findings are concerning for infection.  RIGHT renal artery and vein appear patent.  Large collateral vessels seen in the LEFT upper quadrant, extending from portal vein to LEFT renal vein compatible with spontaneous portosystemic shunt.  Stomach and bowel loops normal appearance.  No significant retained stool burden identified.  Normal appendix and gallbladder.  Prior tubal ligation.  Bladder, ureters, uterus and adnexa unremarkable.  No mass, adenopathy, free air or free fluid.  Lax anterior abdominal wall fascia without discrete hernia.  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  IMPRESSION: Enlarged edematous RIGHT kidney versus prior exam without gross mass or hydronephrosis, raising question of pyelonephritis ; correlation with urinalysis recommended.  Single tiny 9 mm cyst RIGHT kidney appears grossly unchanged.  No urinary tract calcification identified.  Large collateral in LEFT upper quadrant compatible with spontaneous portosystemic shunt.   Electronically Signed   By: Ulyses Southward M.D.   On: 03/27/2015 20:12    Scheduled Meds: . ALPRAZolam  0.5 mg Oral TID  . cefTRIAXone (ROCEPHIN)  IV  1 g Intravenous Q24H  . heparin  5,000 Units Subcutaneous 3 times per day  . Linaclotide  290 mcg Oral Daily  . magnesium sulfate 1 - 4 g bolus IVPB  2 g Intravenous Once  . nicotine  21 mg Transdermal Daily  . potassium chloride  40 mEq Oral Q4H  . tranexamic acid  1,300 mg Oral TID  . valACYclovir  500 mg Oral Daily   Continuous Infusions: . sodium chloride 0.9 % 1,000 mL with potassium chloride 40 mEq infusion 125 mL/hr at 03/28/15 4098    Principal Problem:   Pyelonephritis Active Problems:   Smoking trying to quit    Hypokalemia   UTI (urinary tract infection)   Generalized anxiety disorder    Time spent: 35 minutes    THOMPSON,DANIEL M.D. [facility] Pager [phone]. If 7PM-7AM, please contact night-coverage at [email or web address], password [facility] 03/28/2015, 11:41 AM  LOS: 1 day

## 2015-03-28 NOTE — Progress Notes (Signed)
Merdis Delay, NP notified of critical potassium level of 2.6.

## 2015-03-28 NOTE — Progress Notes (Signed)
CSW spoke with pt regarding childcare arrangements. Pt reports her daughter is with daughter's grandmother.   Cori Razor LCSW [phone]
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
## 2015-03-28 NOTE — Care Management Note (Signed)
Case Management Note  Patient Details  Name: Misty Young MRN: [id number] Date of Birth: 1976-02-21  Subjective/Objective:                   pyelonephritis. Action/Plan:  Discharge planning Expected Discharge Date:   (unknown)               Expected Discharge Plan:  Home/Self Care  In-House Referral:  Clinical Social Work  Discharge planning Services  CM Consult  Post Acute Care Choice:    Choice offered to:     DME Arranged:    DME Agency:     HH Arranged:    HH Agency:     Status of Service:  Completed, signed off  Medicare Important Message Given:    Date Medicare IM Given:    Medicare IM give by:    Date Additional Medicare IM Given:    Additional Medicare Important Message give by:     If discussed at Long Length of Stay Meetings, dates discussed:    Additional Comments: Utilization Review complete. Pt is ind with all ADLs; has Medicaid; anticipate home self care.  No other CM needs were communicated. Yves Dill, RN 03/28/2015, 4:03 PM

## 2015-03-29 DIAGNOSIS — N3001 Acute cystitis with hematuria: Secondary | ICD-10-CM

## 2015-03-29 LAB — BASIC METABOLIC PANEL
Anion gap: 7 (ref 5–15)
BUN: 10 mg/dL (ref 6–20)
CO2: 23 mmol/L (ref 22–32)
Calcium: 8 mg/dL — ABNORMAL LOW (ref 8.9–10.3)
Chloride: 108 mmol/L (ref 101–111)
Creatinine, Ser: 1.02 mg/dL — ABNORMAL HIGH (ref 0.44–1.00)
GFR calc Af Amer: >60 mL/min (ref >60)
GFR calc non Af Amer: >60 mL/min (ref >60)
Glucose, Bld: 118 mg/dL — ABNORMAL HIGH (ref 65–99)
Potassium: 3.9 mmol/L (ref 3.5–5.1)
Sodium: 138 mmol/L (ref 135–145)

## 2015-03-29 LAB — MAGNESIUM: Magnesium: 2.1 mg/dL (ref 1.7–2.4)

## 2015-03-29 LAB — CBC
HCT: 26.6 % — ABNORMAL LOW (ref 36.0–46.0)
Hemoglobin: 9.2 g/dL — ABNORMAL LOW (ref 12.0–15.0)
MCH: 31.6 pg (ref 26.0–34.0)
MCHC: 34.6 g/dL (ref 30.0–36.0)
MCV: 91.4 fL (ref 78.0–100.0)
Platelets: 302 10*3/uL (ref 150–400)
RBC: 2.91 MIL/uL — ABNORMAL LOW (ref 3.87–5.11)
RDW: 16.2 % — ABNORMAL HIGH (ref 11.5–15.5)
WBC: 10.6 10*3/uL — ABNORMAL HIGH (ref 4.0–10.5)

## 2015-03-29 MED ORDER — GI COCKTAIL ~~LOC~~
30.0000 mL | Freq: Once | ORAL | Status: AC
  Administered 2015-03-29: 30 mL via ORAL
  Filled 2015-03-29: qty 30

## 2015-03-29 MED ORDER — KETOROLAC TROMETHAMINE 30 MG/ML IJ SOLN
30.0000 mg | Freq: Four times a day (QID) | INTRAMUSCULAR | Status: DC | PRN
  Administered 2015-03-29 – 2015-03-30 (×3): 30 mg via INTRAVENOUS
  Filled 2015-03-29 (×3): qty 1

## 2015-03-29 MED ORDER — ONDANSETRON HCL 4 MG/2ML IJ SOLN
4.0000 mg | Freq: Four times a day (QID) | INTRAMUSCULAR | Status: DC | PRN
  Administered 2015-03-29: 4 mg via INTRAVENOUS
  Filled 2015-03-29: qty 2

## 2015-03-29 NOTE — Progress Notes (Signed)
Came to patient's room. Both patient and spouse are very lethargic, slurring their words. Patient sitting on edge of bed, with head in her lap. Spouse sitting on couch, falling asleep.

## 2015-03-29 NOTE — Progress Notes (Signed)
Patient's spouse came to the nurse's station stating that patient wants to leave AMA unless her MD comes to see her ASAP. When I brought the form in the room for patient to sign, she had changed her mind and now does not wish to leave AMA. Dr. Janee Morn is aware.

## 2015-03-29 NOTE — Progress Notes (Signed)
[facility] PROGRESS NOTE  Misty Young:[id number] DOB: 09/17/1975 DOA: 03/27/2015 PCP: Lonia Blood, MD  Assessment/Plan: #1 acute pyelonephritis Patient presented with nausea vomiting abdominal pain and per CT scan concerns for pyelonephritis. Urinalysis consistent with a UTI. Patient with complaints of nausea and left CVA tenderness. Urine cultures are pending. Continue IV Rocephin, IV fluids, supportive care, pain management with Tylenol No. 3 and Ultram and Toradol. Follow.  #2 UTI Urine cultures pending. Continue IV Rocephin.  #3 hypokalemia Likely secondary to GI losses. Keep magnesium greater than 2. Repleted.  #4 tobacco abuse Tobacco cessation. Continue nicotine patch.  #5 generalized anxiety disorder Continue home regimen of anxiolytics.  #6 prophylaxis  Heparin for DVT prophylaxis.  Code Status: Full Family Communication: Updated patient and husband at bedside. Disposition Plan: Home when medically stable.   Consultants:  None  Procedures:  CT abdomen and pelvis 03/27/2015  Antibiotics:  IV Rocephin 03/27/2015  HPI/Subjective: Patient complaining of nausea. Patient complaining of left sided pain.  Objective: Filed Vitals:   03/29/15 0612  BP: 100/55  Pulse: 79  Temp: 98.2 F (36.8 C)  Resp: 16    Intake/Output Summary (Last 24 hours) at 03/29/15 1011 Last data filed at 03/29/15 0615  Gross per 24 hour  Intake 2676.25 ml  Output      0 ml  Net 2676.25 ml   Filed Weights   03/27/15 2215  Weight: 100.699 kg (222 lb)    Exam:   General:  NAD.  Cardiovascular: Regular rate rhythm no murmurs rubs or gallops  Respiratory: Clear to rotation bilaterally. No wheezes, no crackles, no rhonchi  Abdomen: Soft, nontender, nondistended, positive bowel sounds. Positive left CVA tenderness  Musculoskeletal: No clubbing cyanosis or edema.  Data Reviewed: Basic Metabolic Panel:  Recent Labs Lab 03/27/15 1445 03/28/15 0425  03/28/15 2014 03/29/15 0600  NA 134* 137 136 138  K 2.5* 2.6* 3.6 3.9  CL 99* 102 108 108  CO2 GLUCOSE 116* 115* 130* 118*  BUN CREATININE 1.16* 1.34* 1.26* 1.02*  CALCIUM 8.2* 8.0* 8.0* 8.0*  MG  --  1.7  --  2.1   Liver Function Tests:  Recent Labs Lab 03/27/15 1445  AST 18  ALT 18  ALKPHOS 150*  BILITOT 0.9  PROT 6.2*  ALBUMIN 2.8*    Recent Labs Lab 03/27/15 1445  LIPASE 12*   No results for input(s): AMMONIA in the last 168 hours. CBC:  Recent Labs Lab 03/27/15 1445 03/28/15 0425 03/29/15 0600  WBC 16.8* 15.7* 10.6*  HGB 10.7* 10.2* 9.2*  HCT 30.7* 28.7* 26.6*  MCV 89.8 89.7 91.4  PLT 281 290 302   Cardiac Enzymes: No results for input(s): CKTOTAL, CKMB, CKMBINDEX, TROPONINI in the last 168 hours. BNP (last 3 results) No results for input(s): BNP in the last 8760 hours.  ProBNP (last 3 results) No results for input(s): PROBNP in the last 8760 hours.  CBG: No results for input(s): GLUCAP in the last 168 hours.  Recent Results (from the past 240 hour(s))  Urine culture     Status: None (Preliminary result)   Collection Time: 03/27/15  4:14 PM  Result Value Ref Range Status   Specimen Description URINE, RANDOM  Final   Special Requests NONE  Final   Culture   Final    >=100,000 COLONIES/mL GRAM NEGATIVE RODS Performed at [facility]    Report Status PENDING  Incomplete  MRSA PCR Screening  Status: None   Collection Time: 03/28/15  9:51 AM  Result Value Ref Range Status   MRSA by PCR NEGATIVE NEGATIVE Final    Comment:        The GeneXpert MRSA Assay (FDA approved for NASAL specimens only), is one component of a comprehensive MRSA colonization surveillance program. It is not intended to diagnose MRSA infection nor to guide or monitor treatment for MRSA infections.      Studies: Ct Abdomen Pelvis W Contrast  03/27/2015   CLINICAL DATA:  Abdominal distention, pain and constipation, little results  with 3 enemas, not passing gas from rectum smoker  EXAM: CT ABDOMEN AND PELVIS WITH CONTRAST  TECHNIQUE: Multidetector CT imaging of the abdomen and pelvis was performed using the standard protocol following bolus administration of intravenous contrast. Sagittal and coronal MPR images reconstructed from axial data set.  CONTRAST:  OMNIPAQUE IOHEXOL 300 MG/ML SOLN IV. Dilute oral contrast.  COMPARISON:  07/29/2013  FINDINGS: Minimal dependent atelectasis at lung bases.  Liver, spleen, pancreas, LEFT kidney, and adrenal glands normal appearance.  RIGHT kidney is enlarged versus previous exam with mild perinephric edema.  Tiny 9 mm focus of lower attenuation laterally likely tiny cyst unchanged.  Minimal enhancement of the walls of the renal pelvis.  No hydronephrosis, calcification or additional mass lesion.  Findings are concerning for infection.  RIGHT renal artery and vein appear patent.  Large collateral vessels seen in the LEFT upper quadrant, extending from portal vein to LEFT renal vein compatible with spontaneous portosystemic shunt.  Stomach and bowel loops normal appearance.  No significant retained stool burden identified.  Normal appendix and gallbladder.  Prior tubal ligation.  Bladder, ureters, uterus and adnexa unremarkable.  No mass, adenopathy, free air or free fluid.  Lax anterior abdominal wall fascia without discrete hernia.  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  IMPRESSION: Enlarged edematous RIGHT kidney versus prior exam without gross mass or hydronephrosis, raising question of pyelonephritis ; correlation with urinalysis recommended.  Single tiny 9 mm cyst RIGHT kidney appears grossly unchanged.  No urinary tract calcification identified.  Large collateral in LEFT upper quadrant compatible with spontaneous portosystemic shunt.   Electronically Signed   By: Ulyses Southward M.D.   On: 03/27/2015 20:12    Scheduled Meds: . ALPRAZolam  0.5 mg Oral TID  . cefTRIAXone (ROCEPHIN)   IV  1 g Intravenous Q24H  . heparin  5,000 Units Subcutaneous 3 times per day  . Linaclotide  290 mcg Oral Daily  . nicotine  21 mg Transdermal Daily  . tranexamic acid  1,300 mg Oral TID  . valACYclovir  500 mg Oral Daily   Continuous Infusions: . sodium chloride 0.9 % 1,000 mL with potassium chloride 40 mEq infusion 125 mL/hr at 03/29/15 1610    Principal Problem:   Pyelonephritis Active Problems:   Smoking trying to quit   Hypokalemia   UTI (urinary tract infection)   Generalized anxiety disorder    Time spent: 35 minutes    THOMPSON,DANIEL M.D. [facility] Pager [phone]. If 7PM-7AM, please contact night-coverage at [email or web address], password [facility] 03/29/2015, 10:11 AM  LOS: 2 days

## 2015-03-30 DIAGNOSIS — N390 Urinary tract infection, site not specified: Secondary | ICD-10-CM

## 2015-03-30 DIAGNOSIS — B9620 Unspecified Escherichia coli [E. coli] as the cause of diseases classified elsewhere: Secondary | ICD-10-CM

## 2015-03-30 LAB — URINE CULTURE: Culture: >=100000

## 2015-03-30 LAB — CBC
HCT: 25.9 % — ABNORMAL LOW (ref 36.0–46.0)
Hemoglobin: 8.7 g/dL — ABNORMAL LOW (ref 12.0–15.0)
MCH: 30.9 pg (ref 26.0–34.0)
MCHC: 33.6 g/dL (ref 30.0–36.0)
MCV: 91.8 fL (ref 78.0–100.0)
Platelets: 304 10*3/uL (ref 150–400)
RBC: 2.82 MIL/uL — ABNORMAL LOW (ref 3.87–5.11)
RDW: 16.7 % — ABNORMAL HIGH (ref 11.5–15.5)
WBC: 8.2 10*3/uL (ref 4.0–10.5)

## 2015-03-30 LAB — BASIC METABOLIC PANEL
Anion gap: 6 (ref 5–15)
BUN: 11 mg/dL (ref 6–20)
CO2: 23 mmol/L (ref 22–32)
Calcium: 8.1 mg/dL — ABNORMAL LOW (ref 8.9–10.3)
Chloride: 112 mmol/L — ABNORMAL HIGH (ref 101–111)
Creatinine, Ser: 1.1 mg/dL — ABNORMAL HIGH (ref 0.44–1.00)
GFR calc Af Amer: >60 mL/min (ref >60)
GFR calc non Af Amer: >60 mL/min (ref >60)
Glucose, Bld: 91 mg/dL (ref 65–99)
Potassium: 4.7 mmol/L (ref 3.5–5.1)
Sodium: 141 mmol/L (ref 135–145)

## 2015-03-30 LAB — MAGNESIUM: Magnesium: 2.1 mg/dL (ref 1.7–2.4)

## 2015-03-30 MED ORDER — ACETAMINOPHEN-CODEINE #3 300-30 MG PO TABS: 1.0000 | ORAL_TABLET | Freq: Four times a day (QID) | ORAL | Status: DC | PRN

## 2015-03-30 MED ORDER — PROMETHAZINE HCL 25 MG PO TABS: 25.0000 mg | ORAL_TABLET | Freq: Every day | ORAL | Status: DC | PRN

## 2015-03-30 MED ORDER — NICOTINE 21 MG/24HR TD PT24: 21.0000 mg | MEDICATED_PATCH | Freq: Every day | TRANSDERMAL | Status: DC

## 2015-03-30 NOTE — Clinical Documentation Improvement (Signed)
Hospitalist  Possible Conditions?  Sepsis - specify causative organism if known} ?POA  Other  Clinically Undetermined  Document any associated diagnoses/conditions.  Supporting Information:(As per notes) "#1 acute pyelonephritis  Patient presented with nausea vomiting abdominal pain and per CT scan concerns for pyelonephritis. Urinalysis consistent with a UTI. Patient with complaints of nausea and left CVA tenderness. Urine cultures are pending. Continue IV Rocephin, IV fluids"  Vitals upon admission:BP 118/71 mmHg  Pulse 114  Temp(Src) 100.2 F (37.9 C) (Oral)  Resp 18  omponent     Latest Ref Rng 03/27/2015 03/28/2015 03/29/2015 03/30/2015  WBC     4.0 - 10.5 K/uL 16.8 (H) 15.7 (H) 10.6 (H) 8.2   Urine Culture 03-27-15 CULTURE:>=100,000 COLONIES/mL ESCHERICHIA COLI   Please exercise your independent, professional judgment when responding. A specific answer is not anticipated or expected.  Thank You, Nevin Bloodgood, RN, BSN, CCDS,Clinical Documentation Specialist:  [phone]  [phone]=[employer] [facility]- Health Information Management

## 2015-03-30 NOTE — Progress Notes (Deleted)
Contacted workers comp case worker Jerron Davis regarding DME, he is aware and will have DME delivered here today. Emailing DME orders per request. 

## 2015-03-30 NOTE — Discharge Summary (Signed)
Physician Discharge Summary  Misty Young WUJ:[id number] DOB: [birth year] DOA: 03/27/2015  PCP: Misty Blood, Young  Admit date: 03/27/2015 Discharge date: 03/30/2015  Time spent: 65 minutes  Recommendations for Outpatient Follow-up:  1. Follow-up with Misty Blood, Young 1-2 weeks. On follow-up patient's acute pyelonephritis will need to be reassessed.  Discharge Diagnoses:  Principal Problem:   Pyelonephritis Active Problems:   E. coli UTI   Smoking trying to quit   Hypokalemia   Generalized anxiety disorder   Discharge Condition: Stable and improved  Diet recommendation: Regular  Filed Weights   03/27/15 2215  Weight: 100.699 kg (222 lb)    History of present illness:  Per Misty Young is a 39 y.o. female who presented to the ED with c/o 2 day history of N/V, abdominal pain, flank pain, constipation. Symptoms are severe, worsening. Had tried laxitives and enemas without effect.  Workup in ED demonstrated pyelonephritis.   Hospital Course:  #1 acute pyelonephritis Patient presented with nausea vomiting abdominal pain and per CT scan concerns for pyelonephritis. Urinalysis consistent with a UTI. Patient with complaints of nausea and left CVA tenderness. Patient was admitted placed on IV fluids placed on IV antibiotics. Urine cultures grew out Escherichia coli. Patient improved clinically remained afebrile and patient will be discharged home on 12 more days of oral ciprofloxacin to complete a two-week course of antibiotic therapy. Patient will be discharged in stable and improved condition and will follow-up with PCP as outpatient.   #2 Escherichia coli UTI Patient was admitted with UTI and pyelonephritis. Urine cultures were obtained which grew out Escherichia coli which was pansensitive. Patient on admission was placed empirically on IV Rocephin. Patient improved clinically. Patient be discharged home on 12 days of oral ciprofloxacin to complete a two-week course  of antibiotic therapy.   #3 hypokalemia Likely secondary to GI losses. Patient's potassium was repleted and hypokalemia had resolved by day of discharge.   #4 tobacco abuse Tobacco cessation. Patient was placed on a nicotine patch.  #5 generalized anxiety disorder Continued home regimen of anxiolytics.   Procedures:  CT abdomen and pelvis 03/27/2015  Consultations:  None  Discharge Exam: Filed Vitals:   03/30/15 0509  BP: 104/63  Pulse: 75  Temp: 97 F (36.1 C)  Resp: 16    General: NAD Cardiovascular: RRR Respiratory: CTAB  Discharge Instructions   Discharge Instructions    Diet general    Complete by:  As directed      Discharge instructions    Complete by:  As directed   Follow up with Misty Young in 1 week.     Increase activity slowly    Complete by:  As directed           Current Discharge Medication List    START taking these medications   Details  acetaminophen-codeine (TYLENOL #3) 300-30 MG per tablet Take 1-2 tablets by mouth every 6 (six) hours as needed for moderate pain. Qty: 20 tablet, Refills: 0    nicotine (NICODERM CQ - DOSED IN MG/24 HOURS) 21 mg/24hr patch Place 1 patch (21 mg total) onto the skin daily. Qty: 28 patch, Refills: 0      CONTINUE these medications which have CHANGED   Details  promethazine (PHENERGAN) 25 MG tablet Take 1 tablet (25 mg total) by mouth daily as needed for nausea or vomiting. Qty: 15 tablet, Refills: 0      CONTINUE these medications which have NOT CHANGED   Details  ALPRAZolam (XANAX) 0.5 MG  tablet Take 0.5 mg by mouth 3 (three) times daily.    ibuprofen (ADVIL,MOTRIN) 800 MG tablet Take 800-1,600 mg by mouth every 8 (eight) hours as needed for moderate pain.    Linaclotide (LINZESS) 290 MCG CAPS capsule Take 1 capsule (290 mcg total) by mouth daily. Qty: 30 capsule, Refills: 11    tranexamic acid (LYSTEDA) 650 MG TABS tablet Take 1,300 mg by mouth 3 (three) times daily. Only during menstrual  cycle- 5 days    valACYclovir (VALTREX) 500 MG tablet Take 500 mg by mouth daily.      STOP taking these medications     buprenorphine-naloxone (SUBOXONE) 8-2 MG SUBL SL tablet        Allergies  Allergen Reactions  . Tramadol Hcl Other (See Comments)    Hallucinations  . Penicillins Hives    Pt states my mom told me about it, she said when i was a child i got hives when i took it.  Tolerates cephalosporins    Follow-up Information    Follow up with Misty Young. Schedule an appointment as soon as possible for a visit in 1 week.   Specialty:  Internal Medicine   Contact information:   [address] [place] [address] [phone]        The results of significant diagnostics from this hospitalization (including imaging, microbiology, ancillary and laboratory) are listed below for reference.    Significant Diagnostic Studies: Ct Abdomen Pelvis W Contrast  03/27/2015   CLINICAL DATA:  Abdominal distention, pain and constipation, little results with 3 enemas, not passing gas from rectum smoker  EXAM: CT ABDOMEN AND PELVIS WITH CONTRAST  TECHNIQUE: Multidetector CT imaging of the abdomen and pelvis was performed using the standard protocol following bolus administration of intravenous contrast. Sagittal and coronal MPR images reconstructed from axial data set.  CONTRAST:  OMNIPAQUE IOHEXOL 300 MG/ML SOLN IV. Dilute oral contrast.  COMPARISON:  07/29/2013  FINDINGS: Minimal dependent atelectasis at lung bases.  Liver, spleen, pancreas, LEFT kidney, and adrenal glands normal appearance.  RIGHT kidney is enlarged versus previous exam with mild perinephric edema.  Tiny 9 mm focus of lower attenuation laterally likely tiny cyst unchanged.  Minimal enhancement of the walls of the renal pelvis.  No hydronephrosis, calcification or additional mass lesion.  Findings are concerning for infection.  RIGHT renal artery and vein appear patent.  Large collateral vessels seen in the  LEFT upper quadrant, extending from portal vein to LEFT renal vein compatible with spontaneous portosystemic shunt.  Stomach and bowel loops normal appearance.  No significant retained stool burden identified.  Normal appendix and gallbladder.  Prior tubal ligation.  Bladder, ureters, uterus and adnexa unremarkable.  No mass, adenopathy, free air or free fluid.  Lax anterior abdominal wall fascia without discrete hernia.  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  IMPRESSION: Enlarged edematous RIGHT kidney versus prior exam without gross mass or hydronephrosis, raising question of pyelonephritis ; correlation with urinalysis recommended.  Single tiny 9 mm cyst RIGHT kidney appears grossly unchanged.  No urinary tract calcification identified.  Large collateral in LEFT upper quadrant compatible with spontaneous portosystemic shunt.   Electronically Signed   By: Ulyses Southward M.D.   On: 03/27/2015 20:12    Microbiology: Recent Results (from the past 240 hour(s))  Urine culture     Status: None   Collection Time: 03/27/15  4:14 PM  Result Value Ref Range Status   Specimen Description URINE, RANDOM  Final   Special Requests NONE  Final   Culture   Final    >=100,000 COLONIES/mL ESCHERICHIA COLI Performed at [facility]    Report Status 03/30/2015 FINAL  Final   Organism ID, Bacteria ESCHERICHIA COLI  Final      Susceptibility   Escherichia coli - MIC*    AMPICILLIN <=2 SENSITIVE Sensitive     CEFAZOLIN <=4 SENSITIVE Sensitive     CEFTRIAXONE <=1 SENSITIVE Sensitive     CIPROFLOXACIN <=0.25 SENSITIVE Sensitive     GENTAMICIN <=1 SENSITIVE Sensitive     IMIPENEM <=0.25 SENSITIVE Sensitive     NITROFURANTOIN <=16 SENSITIVE Sensitive     TRIMETH/SULFA <=20 SENSITIVE Sensitive     AMPICILLIN/SULBACTAM <=2 SENSITIVE Sensitive     PIP/TAZO <=4 SENSITIVE Sensitive     * >=100,000 COLONIES/mL ESCHERICHIA COLI  MRSA PCR Screening     Status: None   Collection Time: 03/28/15  9:51 AM   Result Value Ref Range Status   MRSA by PCR NEGATIVE NEGATIVE Final    Comment:        The GeneXpert MRSA Assay (FDA approved for NASAL specimens only), is one component of a comprehensive MRSA colonization surveillance program. It is not intended to diagnose MRSA infection nor to guide or monitor treatment for MRSA infections.      Labs: Basic Metabolic Panel:  Recent Labs Lab 03/27/15 1445 03/28/15 0425 03/28/15 2014 03/29/15 0600 03/30/15 0650  NA 134* 137 136 138 141  K 2.5* 2.6* 3.6 3.9 4.7  CL 99* 102 108 108 112*  CO2 GLUCOSE 116* 115* 130* 118* 91  BUN CREATININE 1.16* 1.34* 1.26* 1.02* 1.10*  CALCIUM 8.2* 8.0* 8.0* 8.0* 8.1*  MG  --  1.7  --  2.1 2.1   Liver Function Tests:  Recent Labs Lab 03/27/15 1445  AST 18  ALT 18  ALKPHOS 150*  BILITOT 0.9  PROT 6.2*  ALBUMIN 2.8*    Recent Labs Lab 03/27/15 1445  LIPASE 12*   No results for input(s): AMMONIA in the last 168 hours. CBC:  Recent Labs Lab 03/27/15 1445 03/28/15 0425 03/29/15 0600 03/30/15 0650  WBC 16.8* 15.7* 10.6* 8.2  HGB 10.7* 10.2* 9.2* 8.7*  HCT 30.7* 28.7* 26.6* 25.9*  MCV 89.8 89.7 91.4 91.8  PLT 281 290 302 304   Cardiac Enzymes: No results for input(s): CKTOTAL, CKMB, CKMBINDEX, TROPONINI in the last 168 hours. BNP: BNP (last 3 results) No results for input(s): BNP in the last 8760 hours.  ProBNP (last 3 results) No results for input(s): PROBNP in the last 8760 hours.  CBG: No results for input(s): GLUCAP in the last 168 hours.     SignedRamiro Harvest Young [facility] 03/30/2015, 10:58 AM

## 2015-06-19 ENCOUNTER — Emergency Department (HOSPITAL_COMMUNITY)
Admission: EM | Admit: 2015-06-19 | Discharge: 2015-06-19 | Disposition: A | Discharge status: Home / Self Care | Payer: Medicaid Other | Attending: Emergency Medicine | Admitting: Emergency Medicine

## 2015-06-19 ENCOUNTER — Emergency Department (HOSPITAL_COMMUNITY): Payer: Medicaid Other

## 2015-06-19 ENCOUNTER — Encounter (HOSPITAL_COMMUNITY): Payer: Self-pay | Admitting: Emergency Medicine

## 2015-06-19 DIAGNOSIS — S12100A Unspecified displaced fracture of second cervical vertebra, initial encounter for closed fracture: Secondary | ICD-10-CM

## 2015-06-19 DIAGNOSIS — F909 Attention-deficit hyperactivity disorder, unspecified type: Secondary | ICD-10-CM | POA: Insufficient documentation

## 2015-06-19 DIAGNOSIS — S8991XA Unspecified injury of right lower leg, initial encounter: Secondary | ICD-10-CM | POA: Diagnosis not present

## 2015-06-19 DIAGNOSIS — Y9389 Activity, other specified: Secondary | ICD-10-CM | POA: Insufficient documentation

## 2015-06-19 DIAGNOSIS — Z880 Allergy status to penicillin: Secondary | ICD-10-CM | POA: Diagnosis not present

## 2015-06-19 DIAGNOSIS — S12090A Other displaced fracture of first cervical vertebra, initial encounter for closed fracture: Secondary | ICD-10-CM | POA: Diagnosis not present

## 2015-06-19 DIAGNOSIS — Y92410 Unspecified street and highway as the place of occurrence of the external cause: Secondary | ICD-10-CM | POA: Insufficient documentation

## 2015-06-19 DIAGNOSIS — Y998 Other external cause status: Secondary | ICD-10-CM | POA: Insufficient documentation

## 2015-06-19 DIAGNOSIS — F17210 Nicotine dependence, cigarettes, uncomplicated: Secondary | ICD-10-CM | POA: Insufficient documentation

## 2015-06-19 DIAGNOSIS — S3992XA Unspecified injury of lower back, initial encounter: Secondary | ICD-10-CM | POA: Insufficient documentation

## 2015-06-19 DIAGNOSIS — Z87442 Personal history of urinary calculi: Secondary | ICD-10-CM | POA: Diagnosis not present

## 2015-06-19 DIAGNOSIS — Z79899 Other long term (current) drug therapy: Secondary | ICD-10-CM | POA: Diagnosis not present

## 2015-06-19 DIAGNOSIS — M545 Low back pain: Secondary | ICD-10-CM

## 2015-06-19 DIAGNOSIS — M25561 Pain in right knee: Secondary | ICD-10-CM

## 2015-06-19 MED ORDER — METHOCARBAMOL 500 MG PO TABS: 1000.0000 mg | ORAL_TABLET | Freq: Four times a day (QID) | ORAL | Status: DC | PRN
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
## 2015-06-19 NOTE — Discharge Instructions (Signed)
°Emergency Department Resource Guide °1) Find a Doctor and Pay Out of Pocket °Although you won't have to find out who is covered by your insurance plan, it is a good idea to ask around and get recommendations. You will then need to call the office and see if the doctor you have chosen will accept you as a new patient and what types of options they offer for patients who are self-pay. Some doctors offer discounts or will set up payment plans for their patients who do not have insurance, but you will need to ask so you aren't surprised when you get to your appointment. ° °2) Contact Your Local Health Department °Not all health departments have doctors that can see patients for sick visits, but many do, so it is worth a call to see if yours does. If you don't know where your local health department is, you can check in your phone book. The CDC also has a tool to help you locate your state's health department, and many state websites also have listings of all of their local health departments. ° °3) Find a Walk-in Clinic °If your illness is not likely to be very severe or complicated, you may want to try a walk in clinic. These are popping up all over the country in pharmacies, drugstores, and shopping centers. They're usually staffed by nurse practitioners or physician assistants that have been trained to treat common illnesses and complaints. They're usually fairly quick and inexpensive. However, if you have serious medical issues or chronic medical problems, these are probably not your best option. ° °No Primary Care Doctor: °- Call Health Connect at  832-8000 - they can help you locate a primary care doctor that  accepts your insurance, provides certain services, etc. °- Physician Referral Service- 1-800-533-3463 ° °Chronic Pain Problems: °Organization         Address  Phone   Notes  °[facility]  ([phone]) [phone] Patients need to be referred by their primary care doctor.  ° °Medication  Assistance: °Organization         Address  Phone   Notes  °[place] [facility] [address] °[place], [place] [address] (336) 641-8030 --Must be a resident of [place] °-- Must have NO insurance coverage whatsoever (no Medicaid/ Medicare, etc.) °-- The pt. MUST have a primary care doctor that directs their care regularly and follows them in the community °  °[facility]  (866) 331-1348   °[employer]  (888) 892-1162   ° °Agencies that provide inexpensive medical care: °Organization         Address  Phone   Notes  °[facility]  (336) 832-8035   °[facility]    (336) 832-7272   °[facility] [address] °[place], [place] [address] (336) 832-4777   °[facility] [address], °[place] (336) 271-4999   °[facility]    (336) 373-0678   °[facility]    (336) 272-1050   °[facility] ° [address], [place] Phone:  (336) 832-4444, Fax:  (336) 832-4440 Hours of Operation:  9 am - 6 pm, M-F.  Also accepts Medicaid/Medicare and self-pay.  °[facility] ° [address], [place] Phone: (336) [phone], Fax: (336) [phone]. Hours of Operation:  8:30 am - 5:30 pm, M-F.  Also accepts Medicaid and self-pay.  °[facility] [place] [address], [place] Phone: (336) 878-6027   °[facility] [address], [place], [place] (336)723-1848, Ext. 123 Mondays & Thursdays: 7-9 AM.  First 15 patients are seen on a first come, first serve basis. °  ° °Medicaid-accepting [place] Providers: ° °Organization         Address  Phone   Notes  °[facility] [address], [place] (336) 641-2100 Also accepts self-pay patients.  °[facility] [address], [place] ° (336) 856-9996   °[facility] [address], [place]  (336) 288-8857   °[facility] [address] [place] [address], [place] (336) 299-7000   °Veita Bland [address], [place]  ° (336) 373-1557 Only accepts [place] Access Medicaid patients after they have their name applied to their card.  ° °Self-Pay (no insurance) in [place]: ° °Organization         Address  Phone   Notes  °Sickle Cell Patients, [facility] [address], [place] (336) 832-1970   °[facility] [address], [place] (336) 832-4400   °[facility] ° [address], [place] (336) 992-4800   °[facility]/Dr. Osei-Bonsu ° [address] [place] [address], [place] or [address], [place] (336) [phone] Phone number for both [place] and [place] locations is the same.  °[facility] [address], [place] (336) 299-0000   °[facility] [address] [place] [address], [place] or [address] (336) 852-7530 °(336) 878-2260   °[facility] [address], [place] (336) 350-1642, phone; (336) 294-5005, fax Sees patients 1st and 3rd Saturday of every month.  Must not qualify for public or private insurance (i.e. Medicaid, Medicare, [place] Health Choice, Veterans' Benefits) • Household income should be no more than 200% of the poverty level •The clinic cannot treat you if you are pregnant or think you are pregnant • Sexually transmitted diseases are not treated at the clinic.  ° ° °Dental Care: °Organization         Address  Phone  Notes  °[place] [facility] [address], [place] (336) 641-6152 Accepts children up to age 21 who are enrolled in Medicaid or [place] Health Choice; pregnant women with a Medicaid card; and children who have applied for Medicaid or [place] Health Choice, but were declined, whose parents can pay a reduced fee at time of service.  °[place]  [facility] [place]  [address], [place] (336) 641-7733 Accepts children up to age 21 who are enrolled in Medicaid or [place] Health Choice; pregnant women with a Medicaid card; and children who have applied for Medicaid or [place] Health Choice, but were declined, whose parents can pay a reduced fee at time of service.  °[facility] ° [address], [place] (336) 641-4533 Patients are seen by appointment only. Walk-ins are not accepted. [facility] will see patients 18 years of age and older. °Monday - Tuesday (8am-5pm) °Most Wednesdays (8:30-5pm) °$30 per visit, cash only  °[facility] ° [address], [place] (336) 641-4533 Patients are seen by appointment only. Walk-ins are not accepted. [facility] will see patients 18 years of age and older. °One   Wednesday Evening (Monthly: Volunteer Based).  $30 per visit, cash only  °[facility]  (919) 537-3737 for adults; Children under age 4, call Graduate Pediatric Dentistry at ([phone]) [phone]. Children aged 4-14, please call (919) [phone] to request a pediatric application. ° Dental services are provided in all areas of dental care including fillings, crowns and bridges, complete and partial dentures, implants, gum treatment, root canals, and extractions. Preventive care is also provided. Treatment is provided to both adults and children. °Patients are selected via a lottery and there is often a waiting list. °  °[facility] [address], °[place] ° (336) 763-8833 [email or web address] °  °[facility] [address], [place], [place] (336)723-1848, Ext. 123 Second and Fourth Thursday of each month, opens at 6:30 AM; Clinic ends at 9 AM.  Patients are seen on a first-come first-served basis, and a limited number are seen during each clinic.  ° °[facility] ° [address], [place], [place] (336) 723-7904    Eligibility Requirements °You must have lived in [place], [place], or [place] counties for at least the last three months. °  You cannot be eligible for state or federal sponsored healthcare insurance, including Veterans Administration, Medicaid, or Medicare. °  You generally cannot be eligible for healthcare insurance through your employer.  °  How to apply: °Eligibility screenings are held every Tuesday and Wednesday afternoon from 1:00 pm until 4:00 pm. You do not need an appointment for the interview!  °[facility] [address], [place], [place] 336-631-2330   °[facility]  336-342-8273   °[place]  336-703-3100   °[facility]  336-570-6415   ° °Behavioral Health Resources in the Community: °Intensive Outpatient Programs °Organization         Address  Phone  Notes  °[facility] [address], [place], [place] 336-878-6098   °[facility] [address], [place], [place] 336-832-9800   °[facility] [address], [place], [place] ° 336-882-2125   °[facility] [address],  °[place], [place] 1-800-853-5163 or 336-641-4981   °Substance Abuse Resources °Organization         Address  Phone  Notes  °[facility]  336-882-2125   °[facility]  336-784-9470   °[facility]  336-285-9073   °[facility]  336-845-3988   °Residential & Outpatient Substance Abuse Program  1-800-659-3381   °Psychological Services °Organization         Address  Phone  Notes  °[facility]  336- 832-9600   °[facility]  336- 378-7881   °[facility] [address], [place] 1-800-853-5163 or 336-641-4981   ° °Mobile Crisis Teams °Organization         Address  Phone  Notes  °[facility], Mobile Crisis Care Unit  1-877-626-1772   °Assertive °[facility] ° [address]  [place], [place] 336-834-9664   °Sharon DeEsch [address] °[place] 336-554-5454   ° °Self-Help/Support Groups °Organization         Address  Phone             Notes  °[facility] of  - variety of support groups  336- 373-1402 Call for more information  °Narcotics Anonymous (NA), [facility] [address], °[place]  2 meetings at this location  ° °  Residential Treatment Programs Organization         Address  Phone  Notes  [facility] [address],    [place]  [phone]   [facility]  [address], [place] [phone], [place], [place] [phone]   [facility] [address], [place] [phone] Admissions: 8am-3pm M-F  [facility] [address]    [place], [place] [phone]   [facility] [address], [place], [place] [phone]   [facility] [address]  [place], [place] [phone]   [facility] Intensive Outpatient [address], [place], [place] [phone]   [facility] [address]  [place], [place] [phone]-(216)[phone] or [phone](907) [phone]   [facility] [address] [place], [place] 323-557-3220([phone] Accepts Medicaid  [facility] [address]  [place] [phone] Substance Abuse/Addiction Treatment   [facility] Resources Organization         Address  Phone  Notes  [facility]  [phone](888) [phone]   Angie FavaJulie Brannon, PhD [address], [address] [place], [place]   [phone](336) [phone] or [phone](336) ([phone]   [facility]   [address] [place], [place] [phone](336) [phone]   [facility] [address], [place], [place] [phone]) [phone] Insurance/Medicaid/sponsorship through [facility]  [facility] [address]                                    [place], [place] [phone]) [phone] Therapy/tele-psych/case    [facility] [address]   [place], [place] [phone](336) [phone]    Dr. Lolly MustacheArfeen  [phone]) [phone]   [facility]  [facility] 1) [address],  2) [address], [place] 3)  [address], [place] [phone](336) [phone](336) ([phone]  [phone](336) [phone]   [facility] Child Abuse Hotline [phone](336) (480) 853-1421 or [phone](336) ([phone] (After Hours)      Take the prescription as directed. Take your suboxone as previously directed. Apply moist heat or ice to the area(s) of discomfort, for 15 minutes at a time, several times per day for the next few days.  Do not fall asleep on a heating or ice pack.  Call your regular medical doctor today to schedule a follow up appointment in the next 2 to 3 days. Call the Neurosurgeon to schedule a follow up appointment within the next week, sooner if your neck pain is not improving.  Return to the Emergency Department immediately if worsening.
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
## 2015-08-21 ENCOUNTER — Other Ambulatory Visit: Payer: Self-pay

## 2015-08-21 NOTE — Telephone Encounter (Signed)
Patient overdue for f/u OV, liver disease, constipation. No refills without ov.

## 2015-08-21 NOTE — Telephone Encounter (Signed)
LMOM that pt needs appt.

## 2015-08-22 NOTE — Telephone Encounter (Signed)
MADE APPT AND HUSBAND WILL NOTIFY PATIENT OF DATE AND TIME

## 2015-09-07 ENCOUNTER — Ambulatory Visit: Payer: Medicaid Other | Admitting: Gastroenterology

## 2015-09-24 ENCOUNTER — Telehealth: Payer: Self-pay | Admitting: Gastroenterology

## 2015-09-24 ENCOUNTER — Ambulatory Visit: Payer: Medicaid Other | Admitting: Gastroenterology

## 2015-09-24 NOTE — Telephone Encounter (Signed)
Pt was a no show

## 2015-09-25 NOTE — Telephone Encounter (Signed)
Please send letter.

## 2015-09-26 ENCOUNTER — Encounter: Payer: Self-pay | Admitting: Gastroenterology

## 2015-09-26 NOTE — Telephone Encounter (Signed)
Letter mailed

## 2015-11-29 ENCOUNTER — Encounter: Payer: Self-pay | Admitting: Nurse Practitioner

## 2015-11-29 ENCOUNTER — Encounter (INDEPENDENT_AMBULATORY_CARE_PROVIDER_SITE_OTHER): Payer: Self-pay

## 2015-11-29 ENCOUNTER — Ambulatory Visit (INDEPENDENT_AMBULATORY_CARE_PROVIDER_SITE_OTHER): Payer: Medicaid Other | Admitting: Nurse Practitioner

## 2015-11-29 ENCOUNTER — Other Ambulatory Visit: Payer: Self-pay

## 2015-11-29 VITALS — BP 108/72 | HR 78 | Temp 98.7°F | Ht 71.0 in | Wt 256.2 lb

## 2015-11-29 DIAGNOSIS — R7989 Other specified abnormal findings of blood chemistry: Secondary | ICD-10-CM

## 2015-11-29 DIAGNOSIS — R932 Abnormal findings on diagnostic imaging of liver and biliary tract: Secondary | ICD-10-CM | POA: Diagnosis not present

## 2015-11-29 DIAGNOSIS — K649 Unspecified hemorrhoids: Secondary | ICD-10-CM | POA: Diagnosis not present

## 2015-11-29 DIAGNOSIS — R945 Abnormal results of liver function studies: Secondary | ICD-10-CM

## 2015-11-29 DIAGNOSIS — K625 Hemorrhage of anus and rectum: Secondary | ICD-10-CM

## 2015-11-29 MED ORDER — HYDROCORTISONE 2.5 % RE CREA: 1.0000 "application " | TOPICAL_CREAM | Freq: Two times a day (BID) | RECTAL | Status: DC

## 2015-11-29 MED ORDER — PEG 3350-KCL-NA BICARB-NACL 420 G PO SOLR: 4000.0000 mL | ORAL | Status: DC

## 2015-11-29 NOTE — Assessment & Plan Note (Signed)
Normal CT of the abdomen as noted in the history of present illness and above in the abnormal LFTs plan. Ultrasound elastography to evaluate for possible cirrhosis as well as follow-up with CMP for liver enzymes. Return for follow-up in 3 months.

## 2015-11-29 NOTE — Assessment & Plan Note (Signed)
Patient with intermittent rectal bleeding since end of 2015. Cancelled previously schedule colonoscopy due to needing to care for father in law. Last episode of bleeding about a week ago. Stools are variable with possible occasional constipation. Likely benign anorectal source but cannot rule out IBD, polyp, CRC, or other insidious process. Will refer for colonoscopy as previously planned.  Proceed with colonoscopy on propofol with Dr. Darrick PennaFields in the near future. The risks, benefits, and alternatives have been discussed in detail with the patient. They state understanding and desire to proceed.   The patient is a history of narcotic abuse. She is currently on Suboxone treatment as well as Xanax. No anticoagulants. We'll plan for propofol to promote adequate sedation.

## 2015-11-29 NOTE — Assessment & Plan Note (Signed)
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 Recommended GI follow-up which was not completed. Possibly congenital due to no noted splenomegaly. At this point I'll order CBC, CMP as well as ultrasound elastography as initially recommended and not followed through with. Return for follow-up in 3 months.

## 2015-11-29 NOTE — Patient Instructions (Signed)
1. We will schedule your ultrasound for you. 2. Have your labs drawn when you're able to. 3. We will schedule your colonoscopy with possible hemorrhoid banding for you. 4. Return for follow-up in 3 months.

## 2015-11-29 NOTE — Progress Notes (Signed)
Referring Provider: Garba, Mohammad L, MD Primary Care Physician:  Lonia BloodGARBA,LAWAL, MD Primary GI:  Dr. Darrick PRometta EmeryennaFields  Chief Complaint  Patient presents with  . Follow-up    reschedule hemorrhoid procedure.    HPI:   Misty Young is a 40 y.o. female who presents for office visit to reschedule colonoscopy. She was last seen in our office 06/12/2014 for rectal bleeding, hemorrhoids, abdominal swelling, constipation. 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 Recommended GI follow-up by emergency department when the CT was done, however the patient did not follow through. At that time she was waiting to be seen by the Suboxone clinic. She was referred for colonoscopy and possible banding, but refused banding stated she wanted to surgeon to evaluate this. I commended ultrasound elastography related to the shunting from the portal vein and if no evidence of cirrhosis likely congenital and can discuss with vascular surgery. The ultrasound was never completed. Her colonoscopy was scheduled for December 2015 but she canceled in order to push it into 2016 due to insurance, however she canceled her colonoscopy. Her follow-up office visit to reschedule she was a no-show in January 2016. No previous history of colonoscopy noted in our system.  Today she states she had to cancel her colonoscopy because of familky health issues, which are now being cared for by home nurse. States she's still having occasional bleeding, last episode last week, typically on the stool. Also with abdominal pain located lower abdomen. Is on suboxone and her suboxone doctor started her on Movantik. Occasional nausea improved with Zofran, no vomiting. Stools are variable from Birstol 1-7 ("from balls stuck together that I have to push out with my own hands to water to mud.") Also described dark stools. On Prilosec, breakthrough  symptoms about every 2 weeks. Has seen surgeons for hemorrhoid options that would not take her insurance. Has frequent rectal pain/itching. Denies chest pain, dyspnea, dizziness, lightheadedness, syncope, near syncope. Denies any other upper or lower GI symptoms.  Past Medical History  Diagnosis Date  . Opiate use   . Kidney stones   . Attention deficit disorder   . Hemorrhoids     uses Preparation H wipes and cream    Past Surgical History  Procedure Laterality Date  . Therapeutic abortion    . Umbilical hernia repair  06/15/2012    Procedure: HERNIA REPAIR UMBILICAL ADULT;  Surgeon: Ernestene MentionHaywood M Ingram, MD;  Location: [facility];  Service: General;  Laterality: N/A;  . Insertion of mesh  06/15/2012    Procedure: INSERTION OF MESH;  Surgeon: Ernestene MentionHaywood M Ingram, MD;  Location: [facility];  Service: General;  Laterality: N/A;  . Cesarean section  2002/2004/2009/2013    x4  . Lithotripsy      Current Outpatient Prescriptions  Medication Sig Dispense Refill  . ALPRAZolam (XANAX) 0.5 MG tablet Take 0.5 mg by mouth 3 (three) times daily.    Marland Kitchen. amphetamine-dextroamphetamine (ADDERALL) 20 MG tablet Take 20 mg by mouth 3 (three) times daily.    . buprenorphine-naloxone (SUBOXONE) 8-2 MG SUBL SL tablet Place 1 tablet under the tongue 2 (two) times daily.    Marland Kitchen. ibuprofen (ADVIL,MOTRIN) 800 MG tablet Take 800-1,600 mg by mouth every 8 (eight) hours as needed for moderate pain.    . naloxegol oxalate (MOVANTIK) 25 MG TABS tablet Take by mouth daily.    . ondansetron (ZOFRAN) 8 MG tablet Take  by mouth every 8 (eight) hours as needed for nausea or vomiting.    . tranexamic acid (LYSTEDA) 650 MG TABS tablet Take 1,300 mg by mouth 3 (three) times daily. Only during menstrual cycle- 5 days    . valACYclovir (VALTREX) 500 MG tablet Take 500 mg by mouth daily.     No current facility-administered medications for this visit.    Allergies as of 11/29/2015 - Review Complete 11/29/2015  Allergen Reaction Noted  .  Tramadol hcl Other (See Comments) 03/28/2015  . Penicillins Hives 01/27/2011    Family History  Problem Relation Age of Onset  . Diabetes Maternal Grandfather   . Colon cancer Neg Hx     Social History   Social History  . Marital Status: Married    Spouse Name: N/A  . Number of Children: 4  . Years of Education: N/A   Occupational History  . unemployed    Social History Main Topics  . Smoking status: Current Every Day Smoker -- 1.00 packs/day for 15 years    Types: Cigarettes  . Smokeless tobacco: Never Used  . Alcohol Use: No  . Drug Use: No     Comment: Last used opioids in 6-7 months.  . Sexual Activity: Yes    Birth Control/ Protection: Surgical   Other Topics Concern  . None   Social History Narrative    Review of Systems: 10-point ROS negative except as per HPI.   Physical Exam: BP 108/72 mmHg  Pulse 78  Temp(Src) 98.7 F (37.1 C) (Oral)  Ht  (1.803 m)  Wt 256 lb 3.2 oz (116.212 kg)  BMI 35.75 kg/m2  LMP 11/02/2015 (Approximate) General:   Obese female. Alert and oriented. Pleasant and cooperative. Well-nourished and well-developed.  Eyes:  Without icterus, sclera clear and conjunctiva pink.  Ears:  Normal auditory acuity. Cardiovascular:  S1, S2 present without murmurs appreciated. Extremities without clubbing or edema. Respiratory:  Clear to auscultation bilaterally. No wheezes, rales, or rhonchi. No distress.  Gastrointestinal:  +BS, soft, non-tender and non-distended. No HSM noted. No guarding or rebound. Midline hernia noted.  Rectal:  Deferred  Neurologic:  Alert and oriented x4;  grossly normal neurologically. Psych:  Alert and cooperative. Normal mood and affect. Heme/Lymph/Immune: No excessive bruising noted.    11/29/2015 9:42 AM   Disclaimer: This note was dictated with voice recognition software. Similar sounding words can inadvertently be transcribed and may not be corrected upon review.

## 2015-11-29 NOTE — Assessment & Plan Note (Signed)
Patient with a history of hemorrhoids. [place] in [place] and [place] not take [place] insurance. She is interested in banding, if possible. Will refer for hemorrhoid banding, if possible, during colonoscopy.

## 2015-12-04 NOTE — Progress Notes (Signed)
CC'D TO PCP °

## 2015-12-06 ENCOUNTER — Ambulatory Visit (HOSPITAL_COMMUNITY): Admission: RE | Admit: 2015-12-06 | Payer: Medicaid Other | Source: Ambulatory Visit

## 2015-12-11 ENCOUNTER — Ambulatory Visit (HOSPITAL_COMMUNITY): Admission: RE | Admit: 2015-12-11 | Payer: Medicaid Other | Source: Ambulatory Visit

## 2015-12-17 ENCOUNTER — Ambulatory Visit (HOSPITAL_COMMUNITY): Admission: RE | Admit: 2015-12-17 | Payer: Medicaid Other | Source: Ambulatory Visit

## 2015-12-17 NOTE — Patient Instructions (Signed)
SAMIE BARCLIFT  12/17/2015     @    Your procedure is scheduled on:  12/25/2015   Report to Day Surgery at  1130 AM.                         Remember:             Do not eat food or drink liquids after midnight.   Take these medicines the morning of surgery with A SIP OF WATER: xanax, adderall, suboxone, zofran.  Do not wear jewelry, make-up or nail polish.  Do not wear lotions, powders, or perfumes. You may wear deodorant.  Do not shave 48 hours prior to surgery. Men may shave face and neck.  Do not bring valuables to the hospital.               [facility] is not responsible for any belongings or valuables.               Contacts, dentures or bridgework may not be worn into surgery.  Leave suitcase in the car. After surgery it may be brought to your room.                          For patients admitted to the hospital, discharge time              is determined by your treatment team.               Patients discharged the day of surgery will not be allowed to drive home.               Name and phone number of your driver: Family  Special Instructions: Follow the diet and prep instructions given to you by Dr Darrick Penna office.   Please read over the following fact sheets that you were given:             Coughing and Deep Breathing, Surgical Site Infection Prevention, Anesthesia Post-op Instructions and Care and Recovery After Surgery Colonoscopy A colonoscopy is an exam to look at the entire large intestine (colon). This exam can help find problems such as tumors, polyps, inflammation, and areas of bleeding. The exam takes about 1 hour.  LET [facility] CARE PROVIDER KNOW ABOUT:   Any allergies you have.  All medicines you are taking, including vitamins, herbs, eye drops, creams, and over-the-counter medicines.  Previous problems you or members of your family have had with the use of anesthetics.  Any blood disorders you  have.  Previous surgeries you have had.  Medical conditions you have. RISKS AND COMPLICATIONS  Generally, this is a safe procedure. However, as with any procedure, complications can occur. Possible complications include:  Bleeding.  Tearing or rupture of the colon wall.  Reaction to medicines given during the exam.  Infection (rare). BEFORE THE PROCEDURE   Ask your health care provider about changing or stopping your regular medicines.  You may be prescribed an oral bowel prep. This involves drinking a large amount of medicated liquid, starting the day before your procedure. The liquid will cause you to have multiple loose stools until your stool is almost clear or light green. This cleans out your colon in preparation for the procedure.  Do not eat or drink anything else once you have started the bowel prep, unless your  health care provider tells you it is safe to do so.  Arrange for someone to drive you home after the procedure. PROCEDURE   You will be given medicine to help you relax (sedative).  You will lie on your side with your knees bent.  A long, flexible tube with a light and camera on the end (colonoscope) will be inserted through the rectum and into the colon. The camera sends video back to a computer screen as it moves through the colon. The colonoscope also releases carbon dioxide gas to inflate the colon. This helps your health care provider see the area better.  During the exam, your health care provider may take a small tissue sample (biopsy) to be examined under a microscope if any abnormalities are found.  The exam is finished when the entire colon has been viewed. AFTER THE PROCEDURE   Do not drive for 24 hours after the exam.  You may have a small amount of blood in your stool.  You may pass moderate amounts of gas and have mild abdominal cramping or bloating. This is caused by the gas used to inflate your colon during the exam.  Ask when your test  results will be ready and how you will get your results. Make sure you get your test results.   This information is not intended to replace advice given to you by your health care provider. Make sure you discuss any questions you have with your health care provider.   Document Released: 06/20/2000 Document Revised: 04/13/2013 Document Reviewed: 02/28/2013 Elsevier Interactive Patient Education 2016 [employer] Colonoscopy, Care After Refer to this sheet in the next few weeks. These instructions provide you with information on caring for yourself after your procedure. Your health care provider may also give you more specific instructions. Your treatment has been planned according to current medical practices, but problems sometimes occur. Call your health care provider if you have any problems or questions after your procedure. WHAT TO EXPECT AFTER THE PROCEDURE  After your procedure, it is typical to have the following:  A small amount of blood in your stool.  Moderate amounts of gas and mild abdominal cramping or bloating. HOME CARE INSTRUCTIONS  Do not drive, operate machinery, or sign important documents for 24 hours.  You may shower and resume your regular physical activities, but move at a slower pace for the first 24 hours.  Take frequent rest periods for the first 24 hours.  Walk around or put a warm pack on your abdomen to help reduce abdominal cramping and bloating.  Drink enough fluids to keep your urine clear or pale yellow.  You may resume your normal diet as instructed by your health care provider. Avoid heavy or fried foods that are hard to digest.  Avoid drinking alcohol for 24 hours or as instructed by your health care provider.  Only take over-the-counter or prescription medicines as directed by your health care provider.  If a tissue sample (biopsy) was taken during your procedure:  Do not take aspirin or blood thinners for 7 days, or as instructed by your  health care provider.  Do not drink alcohol for 7 days, or as instructed by your health care provider.  Eat soft foods for the first 24 hours. SEEK MEDICAL CARE IF: You have persistent spotting of blood in your stool 2-3 days after the procedure. SEEK IMMEDIATE MEDICAL CARE IF:  You have more than a small spotting of blood in your stool.  You pass large  blood clots in your stool.  Your abdomen is swollen (distended).  You have nausea or vomiting.  You have a fever.  You have increasing abdominal pain that is not relieved with medicine.   This information is not intended to replace advice given to you by your health care provider. Make sure you discuss any questions you have with your health care provider.   Document Released: 02/05/2004 Document Revised: 04/13/2013 Document Reviewed: 02/28/2013 Elsevier Interactive Patient Education 2016 [employer] PATIENT INSTRUCTIONS POST-ANESTHESIA  IMMEDIATELY FOLLOWING SURGERY:  Do not drive or operate machinery for the first twenty four hours after surgery.  Do not make any important decisions for twenty four hours after surgery or while taking narcotic pain medications or sedatives.  If you develop intractable nausea and vomiting or a severe headache please notify your doctor immediately.  FOLLOW-UP:  Please make an appointment with your surgeon as instructed. You do not need to follow up with anesthesia unless specifically instructed to do so.  WOUND CARE INSTRUCTIONS (if applicable):  Keep a dry clean dressing on the anesthesia/puncture wound site if there is drainage.  Once the wound has quit draining you may leave it open to air.  Generally you should leave the bandage intact for twenty four hours unless there is drainage.  If the epidural site drains for more than 36-48 hours please call the anesthesia department.  QUESTIONS?:  Please feel free to call your physician or the hospital operator if you have any questions, and they will  be happy to assist you.

## 2015-12-18 ENCOUNTER — Encounter (HOSPITAL_COMMUNITY)
Admission: RE | Admit: 2015-12-18 | Discharge: 2015-12-18 | Disposition: A | Discharge status: Home / Self Care | Payer: Medicaid Other | Source: Ambulatory Visit | Attending: Gastroenterology | Admitting: Gastroenterology

## 2015-12-18 ENCOUNTER — Other Ambulatory Visit (HOSPITAL_COMMUNITY): Payer: Medicaid Other

## 2015-12-25 ENCOUNTER — Encounter (HOSPITAL_COMMUNITY): Admission: RE | Payer: Self-pay | Source: Ambulatory Visit

## 2015-12-25 ENCOUNTER — Ambulatory Visit (HOSPITAL_COMMUNITY): Admission: RE | Admit: 2015-12-25 | Payer: Medicaid Other | Source: Ambulatory Visit | Admitting: Gastroenterology

## 2015-12-25 SURGERY — COLONOSCOPY WITH PROPOFOL
Anesthesia: Monitor Anesthesia Care

## 2016-01-12 ENCOUNTER — Emergency Department (HOSPITAL_COMMUNITY)
Admission: EM | Admit: 2016-01-12 | Discharge: 2016-01-12 | Disposition: A | Discharge status: Home / Self Care | Payer: Medicaid Other | Attending: Emergency Medicine | Admitting: Emergency Medicine

## 2016-01-12 ENCOUNTER — Encounter (HOSPITAL_COMMUNITY): Payer: Self-pay

## 2016-01-12 ENCOUNTER — Emergency Department (HOSPITAL_COMMUNITY): Payer: Medicaid Other

## 2016-01-12 DIAGNOSIS — R1084 Generalized abdominal pain: Secondary | ICD-10-CM | POA: Diagnosis not present

## 2016-01-12 DIAGNOSIS — R197 Diarrhea, unspecified: Secondary | ICD-10-CM | POA: Insufficient documentation

## 2016-01-12 DIAGNOSIS — Z79899 Other long term (current) drug therapy: Secondary | ICD-10-CM | POA: Diagnosis not present

## 2016-01-12 DIAGNOSIS — R112 Nausea with vomiting, unspecified: Secondary | ICD-10-CM | POA: Diagnosis not present

## 2016-01-12 DIAGNOSIS — F1290 Cannabis use, unspecified, uncomplicated: Secondary | ICD-10-CM | POA: Insufficient documentation

## 2016-01-12 DIAGNOSIS — F909 Attention-deficit hyperactivity disorder, unspecified type: Secondary | ICD-10-CM | POA: Insufficient documentation

## 2016-01-12 DIAGNOSIS — F17210 Nicotine dependence, cigarettes, uncomplicated: Secondary | ICD-10-CM | POA: Insufficient documentation

## 2016-01-12 LAB — I-STAT BETA HCG BLOOD, ED (MC, WL, AP ONLY): I-stat hCG, quantitative: <5 m[IU]/mL (ref <5)

## 2016-01-12 LAB — CBC WITH DIFFERENTIAL/PLATELET
Basophils Absolute: 0 10*3/uL (ref 0.0–0.1)
Basophils Relative: 0 %
Eosinophils Absolute: 0 10*3/uL (ref 0.0–0.7)
Eosinophils Relative: 0 %
HCT: 37.8 % (ref 36.0–46.0)
Hemoglobin: 12.9 g/dL (ref 12.0–15.0)
Lymphocytes Relative: 13 %
Lymphs Abs: 1.2 10*3/uL (ref 0.7–4.0)
MCH: 30.6 pg (ref 26.0–34.0)
MCHC: 34.1 g/dL (ref 30.0–36.0)
MCV: 89.6 fL (ref 78.0–100.0)
Monocytes Absolute: 0.4 10*3/uL (ref 0.1–1.0)
Monocytes Relative: 4 %
Neutro Abs: 7.6 10*3/uL (ref 1.7–7.7)
Neutrophils Relative %: 83 %
Platelets: 284 10*3/uL (ref 150–400)
RBC: 4.22 MIL/uL (ref 3.87–5.11)
RDW: 13.9 % (ref 11.5–15.5)
WBC: 9.1 10*3/uL (ref 4.0–10.5)

## 2016-01-12 LAB — COMPREHENSIVE METABOLIC PANEL
ALT: 20 U/L (ref 14–54)
AST: 16 U/L (ref 15–41)
Albumin: 4.3 g/dL (ref 3.5–5.0)
Alkaline Phosphatase: 74 U/L (ref 38–126)
Anion gap: 8 (ref 5–15)
BUN: 12 mg/dL (ref 6–20)
CO2: 25 mmol/L (ref 22–32)
Calcium: 8.9 mg/dL (ref 8.9–10.3)
Chloride: 109 mmol/L (ref 101–111)
Creatinine, Ser: 0.99 mg/dL (ref 0.44–1.00)
GFR calc Af Amer: >60 mL/min (ref >60)
GFR calc non Af Amer: >60 mL/min (ref >60)
Glucose, Bld: 113 mg/dL — ABNORMAL HIGH (ref 65–99)
Potassium: 3.2 mmol/L — ABNORMAL LOW (ref 3.5–5.1)
Sodium: 142 mmol/L (ref 135–145)
Total Bilirubin: 0.6 mg/dL (ref 0.3–1.2)
Total Protein: 7 g/dL (ref 6.5–8.1)

## 2016-01-12 LAB — URINALYSIS, ROUTINE W REFLEX MICROSCOPIC
Bilirubin Urine: NEGATIVE
Glucose, UA: NEGATIVE mg/dL
Ketones, ur: NEGATIVE mg/dL
Leukocytes, UA: NEGATIVE
Nitrite: NEGATIVE
Protein, ur: NEGATIVE mg/dL
Specific Gravity, Urine: 1.028 (ref 1.005–1.030)
pH: 6 (ref 5.0–8.0)

## 2016-01-12 LAB — URINE MICROSCOPIC-ADD ON

## 2016-01-12 LAB — ETHANOL: Alcohol, Ethyl (B): <5 mg/dL (ref <5)

## 2016-01-12 LAB — LIPASE, BLOOD: Lipase: 33 U/L (ref 11–51)

## 2016-01-12 MED ORDER — LORAZEPAM 2 MG/ML IJ SOLN
1.0000 mg | Freq: Once | INTRAMUSCULAR | Status: AC
  Administered 2016-01-12: 1 mg via INTRAVENOUS
  Filled 2016-01-12: qty 1

## 2016-01-12 MED ORDER — IOPAMIDOL (ISOVUE-300) INJECTION 61%
100.0000 mL | Freq: Once | INTRAVENOUS | Status: AC | PRN
  Administered 2016-01-12: 100 mL via INTRAVENOUS

## 2016-01-12 MED ORDER — PROMETHAZINE HCL 25 MG/ML IJ SOLN
25.0000 mg | Freq: Once | INTRAMUSCULAR | Status: AC
  Administered 2016-01-12: 25 mg via INTRAVENOUS
  Filled 2016-01-12: qty 1

## 2016-01-12 MED ORDER — LORAZEPAM 2 MG/ML IJ SOLN
2.0000 mg | Freq: Once | INTRAMUSCULAR | Status: AC
  Administered 2016-01-12: 2 mg via INTRAVENOUS
  Filled 2016-01-12: qty 1

## 2016-01-12 MED ORDER — SODIUM CHLORIDE 0.9 % IV BOLUS (SEPSIS)
1000.0000 mL | Freq: Once | INTRAVENOUS | Status: AC
  Administered 2016-01-12: 1000 mL via INTRAVENOUS

## 2016-01-12 MED ORDER — FENTANYL CITRATE (PF) 100 MCG/2ML IJ SOLN
50.0000 ug | Freq: Once | INTRAMUSCULAR | Status: AC
  Administered 2016-01-12: 50 ug via INTRAVENOUS
  Filled 2016-01-12: qty 2

## 2016-01-12 MED ORDER — DICYCLOMINE HCL 20 MG PO TABS: 20.0000 mg | ORAL_TABLET | Freq: Two times a day (BID) | ORAL | Status: DC

## 2016-01-12 MED ORDER — PROMETHAZINE HCL 25 MG RE SUPP: 25.0000 mg | Freq: Four times a day (QID) | RECTAL | Status: DC | PRN

## 2016-01-12 NOTE — Discharge Instructions (Signed)
As discussed, your evaluation today has been largely reassuring.  But, it is important that you monitor your condition carefully, and do not hesitate to return to the ED if you develop new, or concerning changes in your condition. ? ?Otherwise, please follow-up with your physician for appropriate ongoing care. ? ?

## 2016-01-12 NOTE — ED Notes (Signed)
Pt from home (motel) and c/o n/v x 12 hours. EMS placed #20 LH and gave NS 200ml and zofran 4mg  on arrival.  C/O abd pain 10/10.  VSS:  130/98, 86 reg., 18, 98%RA, CBG120 by EMS.

## 2016-01-12 NOTE — ED Notes (Signed)
Pt states needs more phenergan. Pt has not vomited entire visit, able to keep down soda, but complaint of "dry heaves." Per Jeraldine LootsLockwood, pt to be given last dose of phenergan and be discharged.

## 2016-01-12 NOTE — ED Provider Notes (Signed)
CSN: [id number]     Arrival date & time 01/12/16  0957 History   First MD Initiated Contact with Patient 01/12/16 1002     Chief Complaint  Patient presents with  . Emesis     (Consider location/radiation/quality/duration/timing/severity/associated sxs/prior Treatment) HPI Patient presents with concern of nausea, vomiting, diarrhea, abdominal pain. Symptoms began about 12 hours ago, without clear precipitant. Since onset no relief with anything, including Zofran, fluids. Patient's pain is diffuse, severe, throughout the abdomen. No other pain, no fever. Patient denies recent health changes, medication changes, diet changes. Patient notes prior similar episodes. She does have a history of prior umbilical hernia repair in the distant past.  Past Medical History  Diagnosis Date  . Opiate use   . Kidney stones   . Attention deficit disorder   . Hemorrhoids     uses Preparation H wipes and cream   Past Surgical History  Procedure Laterality Date  . Therapeutic abortion    . Umbilical hernia repair  06/15/2012    Procedure: HERNIA REPAIR UMBILICAL ADULT;  Surgeon: Ernestene MentionHaywood M Ingram, MD;  Location: [facility];  Service: General;  Laterality: N/A;  . Insertion of mesh  06/15/2012    Procedure: INSERTION OF MESH;  Surgeon: Ernestene MentionHaywood M Ingram, MD;  Location: [facility];  Service: General;  Laterality: N/A;  . Cesarean section  2002/2004/2009/2013    x4  . Lithotripsy     Family History  Problem Relation Age of Onset  . Diabetes Maternal Grandfather   . Colon cancer Neg Hx    Social History  Substance Use Topics  . Smoking status: Current Every Day Smoker -- 1.00 packs/day for 15 years    Types: Cigarettes  . Smokeless tobacco: Never Used  . Alcohol Use: 0.0 oz/week    0 Standard drinks or equivalent per week     Comment: occasionally   OB History    Gravida Para Term Preterm AB TAB SAB Ectopic Multiple Living   6 4 4  2 1 1   4      Review of Systems  Constitutional:       Per  HPI, otherwise negative  HENT:       Per HPI, otherwise negative  Respiratory:       Per HPI, otherwise negative  Cardiovascular:       Per HPI, otherwise negative  Gastrointestinal: Positive for nausea, vomiting, abdominal pain and diarrhea.  Endocrine:       Negative aside from HPI  Genitourinary:       Neg aside from HPI   Musculoskeletal:       Per HPI, otherwise negative  Skin: Negative.   Neurological: Negative for syncope.      Allergies  Tramadol hcl and Penicillins  Home Medications   Prior to Admission medications   Medication Sig Start Date End Date Taking? Authorizing Provider  ALPRAZolam Prudy Feeler(XANAX) 0.5 MG tablet Take 0.5 mg by mouth 3 (three) times daily.   Yes Historical Provider, MD  amphetamine-dextroamphetamine (ADDERALL) 20 MG tablet Take 20 mg by mouth 3 (three) times daily.   Yes Historical Provider, MD  hydrocortisone (ANUSOL-HC) 2.5 % rectal cream Place 1 application rectally 2 (two) times daily. UP TO 10 DAYS AT A TIME 11/29/15  Yes Anice PaganiniEric A Gill, NP  ibuprofen (ADVIL,MOTRIN) 200 MG tablet Take 800 mg by mouth every 6 (six) hours as needed for fever, headache, mild pain, moderate pain or cramping.   Yes Historical Provider, MD  ibuprofen (ADVIL,MOTRIN) 800 MG  tablet Take 800-1,600 mg by mouth every 8 (eight) hours as needed for moderate pain.   Yes Historical Provider, MD  naloxegol oxalate (MOVANTIK) 25 MG TABS tablet Take 25 mg by mouth daily.    Yes Historical Provider, MD  ondansetron (ZOFRAN) 8 MG tablet Take by mouth every 8 (eight) hours as needed for nausea or vomiting.   Yes Historical Provider, MD  tranexamic acid (LYSTEDA) 650 MG TABS tablet Take 1,300 mg by mouth 3 (three) times daily. Only during menstrual cycle- 5 days   Yes Historical Provider, MD  valACYclovir (VALTREX) 500 MG tablet Take 500 mg by mouth daily.   Yes Historical Provider, MD  polyethylene glycol-electrolytes (TRILYTE) 420 g solution Take 4,000 mLs by mouth as directed. Patient not  taking: Reported on 01/12/2016 11/29/15   [place] Bali, MD   BP 118/70 mmHg  Pulse 81  Temp(Src) 98.1 F (36.7 C) (Oral)  Resp 20  Ht  (1.753 m)  Wt 220 lb (99.791 kg)  BMI 32.47 kg/m2  SpO2 100%  LMP 01/12/2016 Physical Exam  Constitutional: She is oriented to person, place, and time. She appears well-developed.  Uncomfortable appearing diaphoretic female awake, alert, interacting appropriately  HENT:  Head: Normocephalic and atraumatic.  Eyes: Conjunctivae and EOM are normal.  Cardiovascular: Normal rate and regular rhythm.   Pulmonary/Chest: Effort normal and breath sounds normal. No stridor. No respiratory distress.  Abdominal: She exhibits no distension.  Appreciable midline lower abdominal scar, no guarding, mild tenderness to palpation throughout  Musculoskeletal: She exhibits no edema.  Neurological: She is alert and oriented to person, place, and time. No cranial nerve deficit.  Skin: Skin is warm and dry.  Psychiatric: She has a normal mood and affect.  Nursing note and vitals reviewed.   ED Course  Procedures (including critical care time) Labs Review Labs Reviewed  COMPREHENSIVE METABOLIC PANEL - Abnormal; Notable for the following:    Potassium 3.2 (*)    Glucose, Bld 113 (*)    All other components within normal limits  URINALYSIS, ROUTINE W REFLEX MICROSCOPIC (NOT AT [facility]) - Abnormal; Notable for the following:    Color, Urine AMBER (*)    APPearance HAZY (*)    Hgb urine dipstick TRACE (*)    All other components within normal limits  URINE MICROSCOPIC-ADD ON - Abnormal; Notable for the following:    Squamous Epithelial / LPF 6-30 (*)    Bacteria, UA RARE (*)    All other components within normal limits  ETHANOL  LIPASE, BLOOD  CBC WITH DIFFERENTIAL/PLATELET  I-STAT BETA HCG BLOOD, ED ([facility], [facility], [facility] ONLY)    Imaging Review Ct Abdomen Pelvis W Contrast  01/12/2016  CLINICAL DATA:  Abdominal pain with nausea and vomiting for 1 day EXAM: CT ABDOMEN  AND PELVIS WITH CONTRAST TECHNIQUE: Multidetector CT imaging of the abdomen and pelvis was performed using the standard protocol following bolus administration of intravenous contrast. Oral contrast was also administered. CONTRAST:  ISOVUE-300 IOPAMIDOL (ISOVUE-300) INJECTION 61% COMPARISON:  March 27, 2015 FINDINGS: Lower chest: Lung bases are clear except for slight atelectasis in the inferior lingula. Hepatobiliary: No focal liver lesions are evident. Gallbladder wall is not appreciably thickened. There is no biliary duct dilatation. Pancreas: No pancreatic mass or inflammatory focus. Spleen: No splenic lesions are evident. Adrenals/Urinary Tract: Adrenals appear normal bilaterally. There is a 6 mm cyst in the mid right kidney. There is no appreciable renal hydronephrosis on either side. There is no renal or ureteral  calculus on either side. The urinary bladder is midline with wall thickness within normal limits. Stomach/Bowel: There is no appreciable bowel wall or mesenteric thickening. There is no evident bowel obstruction. No free air or portal venous air. Vascular/Lymphatic: There is no abdominal aortic aneurysm. The major mesenteric vessels are patent. No vascular lesions are evident. There is no adenopathy in the abdomen or pelvis. Reproductive: Uterus is anteverted. No pelvic mass or pelvic fluid collection. There are tubal ligation clips bilaterally. Other: Appendix appears normal. There is no ascites or abscess in the abdomen or pelvis. There is thinning of the rectus muscle in the midline. Musculoskeletal: No blastic or lytic bone lesions are identified. There is degenerative change in the lumbar spine. There is no intramuscular or abdominal wall lesion. IMPRESSION: No bowel obstruction.  No abscess.  Appendix appears normal. No renal or ureteral calculi. No hydronephrosis. A cause for patient's symptoms has not been established with this study. Electronically Signed   By: Bretta Bang  III M.D.   On: 01/12/2016 13:00   I have personally reviewed and evaluated these images and lab results as part of my medical decision-making.  Chart review notable for ongoing evaluation with GI for liver abnormality, patient has had a previously scheduled colonoscopy, which has not been performed.   2:00 PM Patient sitting upright, not currently vomiting, though she continues to complain of abdominal pain, nausea. I reviewed all findings with her.  3:11 PM Patient standing upright, on the telephone. I discussed importance of GI follow-up. Patient states that she would like some pain. She'll be discharged on dicyclomine.  MDM  Patient presents with abdominal pain, nausea, vomiting. Here she is awake and alert, with a soft, non-peritoneal, though tender abdomen. Given the patient's history of portal systemic shunt, liver pathology, and her missed recent follow-ups, CT scan was performed. This was reassuring, as were labs. Patient improved here, was ambulatory, upright, tolerating oral intake. Patient discharged in stable condition.  Gerhard Munch, MD 01/12/16 [phone]

## 2016-01-12 NOTE — ED Notes (Signed)
Bed: [id number] Expected date:  Expected time:  Means of arrival:  Comments: EMS 40 yo F, n/v

## 2016-01-12 NOTE — ED Notes (Signed)
Patient transported to CT 

## 2016-01-31 ENCOUNTER — Telehealth: Payer: Self-pay | Admitting: Internal Medicine

## 2016-02-20 NOTE — Telephone Encounter (Signed)
Dr.Perry has reviewed records and has declined to accept patient due to the full capacity of his appointments. I have been unable to reach patient to notify her all phone numbers in system are not in service.

## 2016-02-29 ENCOUNTER — Observation Stay (HOSPITAL_COMMUNITY)
Admission: EM | Admit: 2016-02-29 | Discharge: 2016-02-29 | Disposition: A | Discharge status: Home / Self Care | Payer: Medicaid Other | Attending: Internal Medicine | Admitting: Internal Medicine

## 2016-02-29 ENCOUNTER — Encounter (HOSPITAL_COMMUNITY): Payer: Self-pay | Admitting: Emergency Medicine

## 2016-02-29 ENCOUNTER — Emergency Department (HOSPITAL_COMMUNITY): Payer: Medicaid Other

## 2016-02-29 DIAGNOSIS — R509 Fever, unspecified: Secondary | ICD-10-CM

## 2016-02-29 DIAGNOSIS — F11221 Opioid dependence with intoxication delirium: Secondary | ICD-10-CM

## 2016-02-29 DIAGNOSIS — F411 Generalized anxiety disorder: Secondary | ICD-10-CM | POA: Diagnosis not present

## 2016-02-29 DIAGNOSIS — F17210 Nicotine dependence, cigarettes, uncomplicated: Secondary | ICD-10-CM | POA: Insufficient documentation

## 2016-02-29 DIAGNOSIS — G9340 Encephalopathy, unspecified: Secondary | ICD-10-CM | POA: Diagnosis not present

## 2016-02-29 DIAGNOSIS — F909 Attention-deficit hyperactivity disorder, unspecified type: Secondary | ICD-10-CM | POA: Diagnosis not present

## 2016-02-29 DIAGNOSIS — T1491XA Suicide attempt, initial encounter: Secondary | ICD-10-CM

## 2016-02-29 DIAGNOSIS — E876 Hypokalemia: Secondary | ICD-10-CM | POA: Diagnosis not present

## 2016-02-29 DIAGNOSIS — F32A Depression, unspecified: Secondary | ICD-10-CM

## 2016-02-29 DIAGNOSIS — T50901A Poisoning by unspecified drugs, medicaments and biological substances, accidental (unintentional), initial encounter: Secondary | ICD-10-CM | POA: Diagnosis present

## 2016-02-29 DIAGNOSIS — T426X1A Poisoning by other antiepileptic and sedative-hypnotic drugs, accidental (unintentional), initial encounter: Principal | ICD-10-CM | POA: Insufficient documentation

## 2016-02-29 DIAGNOSIS — T50904A Poisoning by unspecified drugs, medicaments and biological substances, undetermined, initial encounter: Secondary | ICD-10-CM

## 2016-02-29 DIAGNOSIS — T50902A Poisoning by unspecified drugs, medicaments and biological substances, intentional self-harm, initial encounter: Secondary | ICD-10-CM

## 2016-02-29 DIAGNOSIS — F329 Major depressive disorder, single episode, unspecified: Secondary | ICD-10-CM

## 2016-02-29 LAB — URINE MICROSCOPIC-ADD ON

## 2016-02-29 LAB — URINALYSIS, ROUTINE W REFLEX MICROSCOPIC
Bilirubin Urine: NEGATIVE
Glucose, UA: NEGATIVE mg/dL
Ketones, ur: NEGATIVE mg/dL
Leukocytes, UA: NEGATIVE
Nitrite: NEGATIVE
Protein, ur: NEGATIVE mg/dL
Specific Gravity, Urine: 1.004 — ABNORMAL LOW (ref 1.005–1.030)
pH: 6.5 (ref 5.0–8.0)

## 2016-02-29 LAB — POC URINE PREG, ED: Preg Test, Ur: NEGATIVE

## 2016-02-29 LAB — COMPREHENSIVE METABOLIC PANEL
ALT: 13 U/L — ABNORMAL LOW (ref 14–54)
AST: 15 U/L (ref 15–41)
Albumin: 4.4 g/dL (ref 3.5–5.0)
Alkaline Phosphatase: 70 U/L (ref 38–126)
Anion gap: 6 (ref 5–15)
BUN: 12 mg/dL (ref 6–20)
CO2: 29 mmol/L (ref 22–32)
Calcium: 9.2 mg/dL (ref 8.9–10.3)
Chloride: 100 mmol/L — ABNORMAL LOW (ref 101–111)
Creatinine, Ser: 0.92 mg/dL (ref 0.44–1.00)
GFR calc Af Amer: >60 mL/min (ref >60)
GFR calc non Af Amer: >60 mL/min (ref >60)
Glucose, Bld: 70 mg/dL (ref 65–99)
Potassium: 3 mmol/L — ABNORMAL LOW (ref 3.5–5.1)
Sodium: 135 mmol/L (ref 135–145)
Total Bilirubin: 0.5 mg/dL (ref 0.3–1.2)
Total Protein: 7.5 g/dL (ref 6.5–8.1)

## 2016-02-29 LAB — RAPID URINE DRUG SCREEN, HOSP PERFORMED
Amphetamines: NOT DETECTED
Barbiturates: NOT DETECTED
Benzodiazepines: NOT DETECTED
Cocaine: POSITIVE — AB
Opiates: NOT DETECTED
Tetrahydrocannabinol: POSITIVE — AB

## 2016-02-29 LAB — BASIC METABOLIC PANEL
Anion gap: 4 — ABNORMAL LOW (ref 5–15)
BUN: 8 mg/dL (ref 6–20)
CO2: 27 mmol/L (ref 22–32)
Calcium: 8.1 mg/dL — ABNORMAL LOW (ref 8.9–10.3)
Chloride: 108 mmol/L (ref 101–111)
Creatinine, Ser: 0.69 mg/dL (ref 0.44–1.00)
GFR calc Af Amer: >60 mL/min (ref >60)
GFR calc non Af Amer: >60 mL/min (ref >60)
Glucose, Bld: 98 mg/dL (ref 65–99)
Potassium: 3.8 mmol/L (ref 3.5–5.1)
Sodium: 139 mmol/L (ref 135–145)

## 2016-02-29 LAB — TSH: TSH: 1.107 u[IU]/mL (ref 0.350–4.500)

## 2016-02-29 LAB — ACETAMINOPHEN LEVEL: Acetaminophen (Tylenol), Serum: <10 ug/mL — ABNORMAL LOW (ref 10–30)

## 2016-02-29 LAB — CBC
HCT: 38.3 % (ref 36.0–46.0)
Hemoglobin: 13.1 g/dL (ref 12.0–15.0)
MCH: 31.1 pg (ref 26.0–34.0)
MCHC: 34.2 g/dL (ref 30.0–36.0)
MCV: 91 fL (ref 78.0–100.0)
Platelets: 242 10*3/uL (ref 150–400)
RBC: 4.21 MIL/uL (ref 3.87–5.11)
RDW: 13.7 % (ref 11.5–15.5)
WBC: 13.1 10*3/uL — ABNORMAL HIGH (ref 4.0–10.5)

## 2016-02-29 LAB — ETHANOL: Alcohol, Ethyl (B): <5 mg/dL (ref <5)

## 2016-02-29 LAB — MRSA PCR SCREENING: MRSA by PCR: NEGATIVE

## 2016-02-29 LAB — I-STAT CG4 LACTIC ACID, ED: Lactic Acid, Venous: 0.83 mmol/L (ref 0.5–1.9)

## 2016-02-29 LAB — SALICYLATE LEVEL: Salicylate Lvl: <4 mg/dL (ref 2.8–30.0)

## 2016-02-29 LAB — CBG MONITORING, ED: Glucose-Capillary: 116 mg/dL — ABNORMAL HIGH (ref 65–99)

## 2016-02-29 MED ORDER — SODIUM CHLORIDE 0.9 % IV SOLN
INTRAVENOUS | Status: DC
  Administered 2016-02-29: 13:00:00 via INTRAVENOUS

## 2016-02-29 MED ORDER — POTASSIUM CHLORIDE CRYS ER 20 MEQ PO TBCR
40.0000 meq | EXTENDED_RELEASE_TABLET | Freq: Once | ORAL | Status: AC
  Administered 2016-02-29: 40 meq via ORAL
  Filled 2016-02-29: qty 2

## 2016-02-29 MED ORDER — SODIUM CHLORIDE 0.9 % IV SOLN
Freq: Once | INTRAVENOUS | Status: AC
  Administered 2016-02-29: 01:00:00 via INTRAVENOUS

## 2016-02-29 MED ORDER — ACETAMINOPHEN 325 MG PO TABS
650.0000 mg | ORAL_TABLET | Freq: Once | ORAL | Status: AC
  Administered 2016-02-29: 650 mg via ORAL
  Filled 2016-02-29: qty 2

## 2016-02-29 MED ORDER — ENOXAPARIN SODIUM 60 MG/0.6ML ~~LOC~~ SOLN
50.0000 mg | SUBCUTANEOUS | Status: DC
  Filled 2016-02-29: qty 0.6

## 2016-02-29 MED ORDER — POTASSIUM CHLORIDE IN NACL 20-0.9 MEQ/L-% IV SOLN
Freq: Once | INTRAVENOUS | Status: DC
  Filled 2016-02-29: qty 1000

## 2016-02-29 MED ORDER — LORAZEPAM 2 MG/ML IJ SOLN: 0.5000 mg | INTRAMUSCULAR | Status: DC | PRN

## 2016-02-29 MED ORDER — SODIUM CHLORIDE 0.9 % IV BOLUS (SEPSIS)
1000.0000 mL | Freq: Once | INTRAVENOUS | Status: AC
  Administered 2016-02-29: 1000 mL via INTRAVENOUS

## 2016-02-29 MED ORDER — ACETAMINOPHEN 325 MG PO TABS: 650.0000 mg | ORAL_TABLET | Freq: Four times a day (QID) | ORAL | Status: DC | PRN

## 2016-02-29 MED ORDER — SENNOSIDES-DOCUSATE SODIUM 8.6-50 MG PO TABS: 1.0000 | ORAL_TABLET | Freq: Every evening | ORAL | Status: DC | PRN

## 2016-02-29 NOTE — ED Notes (Signed)
Poison Control Recommendations: Fluids Benzos for agitation Aggressive management of Temperature  Poison control expert suspects other medications may have been taken besides those listed in triage note, such as Bentyl, Phenergan, or Adderral

## 2016-02-29 NOTE — Progress Notes (Signed)
After discharge, patient was escorted outside to waiting taxi by this RN.

## 2016-02-29 NOTE — ED Notes (Signed)
Pt was sleeping and her oxygen saturation dropped to 77% RA. Pt placed on 2 liters by [place] and oxygen saturation increased to 100%.

## 2016-02-29 NOTE — ED Notes (Signed)
Called poison control spoke with cheryl  Pt recommendations are as follows  IV fluids over maintanance  EKG  Ace level  CMP  With ibuprofen monitor for GI distress ( and acute renal failure ) I & O Observe for next 6 to 8 hours.   Provider notified. And report given
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
## 2016-02-29 NOTE — ED Notes (Signed)
Bed: RESB Expected date:  Expected time:  Means of arrival:  Comments: EMS 40yo overdose

## 2016-02-29 NOTE — Progress Notes (Signed)
CSW faxed magistrate the rescind IVC paperwork at 5:10pm. CSW confirmed with the after hours number [phone] that they received the paperwork.   Stacy GardnerErin Davenport, LCSWA Clinical Social Worker [phone]

## 2016-02-29 NOTE — ED Notes (Signed)
Sitter arrived at beside.

## 2016-02-29 NOTE — ED Notes (Signed)
Notified RN,John pt. CBG 116.

## 2016-02-29 NOTE — ED Provider Notes (Addendum)
[facility]-EMERGENCY DEPT Provider Note   CSN: [id number] Arrival date & time: 02/29/16  7846  By signing my name below, I, Majel Homer, attest that this documentation has been prepared under the direction and in the presence of Gilda Crease, MD . Electronically Signed: Majel Homer, Scribe. 02/29/2016. 1:17 AM.  History   Chief Complaint Chief Complaint  Patient presents with  . Drug Overdose   The history is provided by the patient. No language interpreter was used.   LEVEL 5 CAVEAT: HPI and ROS limited due to drug overdose   HPI Comments: Misty Young is a 40 y.o. female with PMHx of opiate use, who presents to the Emergency Department by EMS for evaluation of drug overdose that occurred ~2 hours ago. Per EMS, pt overdosed on an unknown amount of ambien, 1 gabapentin and unknown number of ibuprofen within the last 2 hours. Per nurse, pt had a prescription of Ambien filled on 02/25/16 in which she had 30 pills; today there were no pills left in the bottle upon EMS arrival. Pt's roommate initially called EMS and stated pt verbalized that she was not trying to harm herself.   Past Medical History:  Diagnosis Date  . Attention deficit disorder   . Hemorrhoids    uses Preparation H wipes and cream  . Kidney stones   . Opiate use     Patient Active Problem List   Diagnosis Date Noted  . E. coli UTI 03/28/2015  . Generalized anxiety disorder 03/28/2015  . Pyelonephritis 03/27/2015  . Smoking trying to quit 03/27/2015  . Hypokalemia 03/27/2015  . Abnormal LFTs 06/12/2014  . Abnormal liver CT 06/12/2014  . Rectal bleeding 06/12/2014  . Abdominal swelling 06/12/2014  . Constipation 06/12/2014  . Hemorrhoids 06/12/2014  . Umbilical hernia 05/20/2012    Past Surgical History:  Procedure Laterality Date  . CESAREAN SECTION  2002/2004/2009/2013   x4  . INSERTION OF MESH  06/15/2012   Procedure: INSERTION OF MESH;  Surgeon: Ernestene Mention, MD;  Location: [facility] ORS;   Service: General;  Laterality: N/A;  . LITHOTRIPSY    . THERAPEUTIC ABORTION    . UMBILICAL HERNIA REPAIR  06/15/2012   Procedure: HERNIA REPAIR UMBILICAL ADULT;  Surgeon: Ernestene Mention, MD;  Location: [facility] ORS;  Service: General;  Laterality: N/A;    OB History    Gravida Para Term Preterm AB Living   6 4 4   2 4    SAB TAB Ectopic Multiple Live Births   1 1     1        Home Medications    Prior to Admission medications   Medication Sig Start Date End Date Taking? Authorizing Provider  amphetamine-dextroamphetamine (ADDERALL) 20 MG tablet Take 20 mg by mouth 3 (three) times daily.   Yes Historical Provider, MD  buprenorphine-naloxone (SUBOXONE) 8-2 MG SUBL SL tablet Place 1 tablet under the tongue daily.   Yes Historical Provider, MD  dicyclomine (BENTYL) 20 MG tablet Take 1 tablet (20 mg total) by mouth 2 (two) times daily. Patient not taking: Reported on 02/29/2016 01/12/16   Gerhard Munch, MD  hydrocortisone (ANUSOL-HC) 2.5 % rectal cream Place 1 application rectally 2 (two) times daily. UP TO 10 DAYS AT A TIME Patient not taking: Reported on 02/29/2016 11/29/15   Anice Paganini, NP  polyethylene glycol-electrolytes (TRILYTE) 420 g solution Take 4,000 mLs by mouth as directed. Patient not taking: Reported on 01/12/2016 11/29/15   West Bali, MD  promethazine [facility])  25 MG suppository Place 1 suppository (25 mg total) rectally every 6 (six) hours as needed for nausea or vomiting. Patient not taking: Reported on 02/29/2016 01/12/16   Gerhard Munch, MD    Family History Family History  Problem Relation Age of Onset  . Diabetes Maternal Grandfather   . Colon cancer Neg Hx     Social History Social History  Substance Use Topics  . Smoking status: Current Every Day Smoker    Packs/day: 1.00    Years: 15.00    Types: Cigarettes  . Smokeless tobacco: Never Used  . Alcohol use 0.0 oz/week     Comment: occasionally     Allergies   Tramadol hcl and Penicillins  Review of  Systems Review of Systems  Unable to perform ROS: Patient unresponsive   Physical Exam Updated Vital Signs BP 132/86 (BP Location: Left Arm)   Pulse 119   Temp 102.4 F (39.1 C) (Oral)   Resp 19   Ht 5\' 11"  (1.803 m)   Wt 228 lb (103.4 kg)   SpO2 97%   BMI 31.80 kg/m   Physical Exam  Constitutional: She appears well-developed and well-nourished. She appears lethargic. No distress.  HENT:  Head: Normocephalic and atraumatic.  Right Ear: Hearing normal.  Left Ear: Hearing normal.  Nose: Nose normal.  Mouth/Throat: Oropharynx is clear and moist and mucous membranes are normal.  Eyes: Conjunctivae and EOM are normal. Pupils are equal, round, and reactive to light.  Neck: Normal range of motion. Neck supple.  Cardiovascular: Regular rhythm, S1 normal and S2 normal.  Exam reveals no gallop and no friction rub.   No murmur heard. tachycardia  Pulmonary/Chest: Effort normal and breath sounds normal. No respiratory distress. She exhibits no tenderness.  Abdominal: Soft. Normal appearance and bowel sounds are normal. There is no hepatosplenomegaly. There is no tenderness. There is no rebound, no guarding, no tenderness at McBurney's point and negative Murphy's sign. No hernia.  Musculoskeletal: Normal range of motion.  Neurological: She has normal strength. She appears lethargic. She is disoriented. No cranial nerve deficit or sensory deficit. Coordination normal. GCS eye subscore is 4. GCS verbal subscore is 4. GCS motor subscore is 5.  Skin: Skin is warm, dry and intact. No rash noted. No cyanosis.  Psychiatric: Her speech is delayed and slurred. She is slowed and withdrawn.  Nursing note and vitals reviewed.  ED Treatments / Results  Labs (all labs ordered are listed, but only abnormal results are displayed) Labs Reviewed  COMPREHENSIVE METABOLIC PANEL - Abnormal; Notable for the following:       Result Value   Potassium 3.0 (*)    Chloride 100 (*)    ALT 13 (*)    All other  components within normal limits  ACETAMINOPHEN LEVEL - Abnormal; Notable for the following:    Acetaminophen (Tylenol), Serum <10 (*)    All other components within normal limits  CBC - Abnormal; Notable for the following:    WBC 13.1 (*)    All other components within normal limits  URINE RAPID DRUG SCREEN, HOSP PERFORMED - Abnormal; Notable for the following:    Cocaine POSITIVE (*)    Tetrahydrocannabinol POSITIVE (*)    All other components within normal limits  URINALYSIS, ROUTINE W REFLEX MICROSCOPIC (NOT AT [facility]) - Abnormal; Notable for the following:    Specific Gravity, Urine 1.004 (*)    Hgb urine dipstick TRACE (*)    All other components within normal limits  URINE MICROSCOPIC-ADD ON -  Abnormal; Notable for the following:    Squamous Epithelial / LPF 0-5 (*)    Bacteria, UA RARE (*)    All other components within normal limits  CBG MONITORING, ED - Abnormal; Notable for the following:    Glucose-Capillary 116 (*)    All other components within normal limits  CULTURE, BLOOD (ROUTINE X 2)  CULTURE, BLOOD (ROUTINE X 2)  ETHANOL  SALICYLATE LEVEL  POC URINE PREG, ED  I-STAT CG4 LACTIC ACID, ED    EKG  EKG Interpretation  Date/Time:  Friday February 29 2016 00:58:02 EDT Ventricular Rate:  117 PR Interval:    QRS Duration: 97 QT Interval:  325 QTC Calculation: 454 R Axis:   88 Text Interpretation:  Sinus tachycardia Borderline T wave abnormalities Confirmed by Blinda Leatherwood  MD, CHRISTOPHER [phone]) on 02/29/2016 1:27:31 AM       Radiology Dg Chest Port 1 View  Result Date: 02/29/2016 CLINICAL DATA:  40 y/o  F; reported fever. EXAM: PORTABLE CHEST 1 VIEW COMPARISON:  07/17/2013 chest radiograph FINDINGS: The heart size and mediastinal contours are within normal limits. Both lungs are clear. The visualized skeletal structures are unremarkable. Lung bases partially excluded from field of view. IMPRESSION: Lung bases partially excluded from field of view. Visualized lungs are  clear. Electronically Signed   By: Mitzi Hansen M.D.   On: 02/29/2016 05:58    Procedures Procedures  DIAGNOSTIC STUDIES:  Oxygen Saturation is 97% on RA, normal by my interpretation.    COORDINATION OF CARE:  1:15 AM Discussed treatment plan with pt at bedside and pt agreed to plan.  Medications Ordered in ED Medications  sodium chloride 0.9 % bolus 1,000 mL (not administered)  0.9 % NaCl with KCl 20 mEq/ L  infusion (not administered)  sodium chloride 0.9 % bolus 1,000 mL (1,000 mLs Intravenous New Bag/Given 02/29/16 0130)    Followed by  0.9 %  sodium chloride infusion ( Intravenous Stopped 02/29/16 0300)  acetaminophen (TYLENOL) tablet 650 mg (650 mg Oral Given 02/29/16 0533)    Initial Impression / Assessment and Plan / ED Course  I have reviewed the triage vital signs and the nursing notes.  Pertinent labs & imaging results that were available during my care of the patient were reviewed by me and considered in my medical decision making (see chart for details).  Clinical Course   Patient presents to the ER after intentional overdose. Patient took an unknown quantity, possibly up to 30 Ambien tablets. She also reportedly took ibuprofen, possibly Neurontin. She does have other prescriptions, unclear if she took any of these other medicines. Patient was very lethargic at arrival. She did awaken to voice and attempted intermittently to follow commands. She was slurring her words and confused. Alertness has improved here in the ER but she is still altered.  Initial consultation with poison control resulted in recommendations for a period of observation of 6-8 hours with supportive care. Patient was, however, found to be hyperthermic with a temperature of 102.5 at arrival. This was monitored and has not improved despite her ibuprofen overdose. We consulted with poison control wheezes concern for possible additional overdose including Adderall. Drug screen is negative for  amphetamines today but she has had positive serum the past. She is positive for cocaine in her urine screen as well. Patient is mildly tachycardic. She has not been hypotensive. There does not appear to be any source of infection or sepsis at this time. Lactate normal. Chest x-ray does not show pneumonia.  Urinalysis did not show sign of infection. Poison control recommends hospitalization for further management.  During the period of evaluation in the emergency department, as she became more alert, patient started to want to be discharged. She was threatening to leave and therefore involuntary commitment was initiated.  CRITICAL CARE Performed by: Gilda CreasePOLLINA, CHRISTOPHER J.   Total critical care time: 35 minutes  Critical care time was exclusive of separately billable procedures and treating other patients.  Critical care was necessary to treat or prevent imminent or life-threatening deterioration.  Critical care was time spent personally by me on the following activities: development of treatment plan with patient and/or surrogate as well as nursing, discussions with consultants, evaluation of patient's response to treatment, examination of patient, obtaining history from patient or surrogate, ordering and performing treatments and interventions, ordering and review of laboratory studies, ordering and review of radiographic studies, pulse oximetry and re-evaluation of patient's condition.   I personally performed the services described in this documentation, which was scribed in my presence. The recorded information has been reviewed and is accurate.   Final Clinical Impressions(s) / ED Diagnoses   Final diagnoses:  Overdose, intentional self-harm, initial encounter (HCC)  Suicide attempt [facility](HCC)  Depression  Hyperthermia  Hypokalemia    New Prescriptions New Prescriptions   No medications on file     Gilda Creasehristopher J Pollina, MD 02/29/16 [id number]    Gilda Creasehristopher J Pollina, MD 02/29/16  [phone]

## 2016-02-29 NOTE — ED Notes (Signed)
Pt continues to require constant redirection to not stand up, shout out, and road room; pt repeatedly asking the same questions despite in-depth explanations given; pt tearful and provided reassurance; pt currently sitting on bed.

## 2016-02-29 NOTE — ED Notes (Signed)
Patient ambulated to restroom, with sitter.

## 2016-02-29 NOTE — Progress Notes (Signed)
   02/29/16 1400  Clinical Encounter Type  Visited With Patient  Visit Type Initial;Psychological support;Spiritual support;Critical Care  Referral From Nurse  Consult/Referral To Chaplain  Spiritual Encounters  Spiritual Needs Emotional;Other (Comment) (Pastoral conversation/Support)  Stress Factors  Patient Stress Factors Other (Comment);Financial concerns;Loss of control   I visited with the patient per Spiritual Care Consult. The patient was in bed "resting her eyes" when I arrived.  She was verbally agitated and angry over her hospitalization. When asked what could be done to support her, she stated, "Let me go home."  Mrs. Misty Young stated that [facility] with a roommate through a program. She stated that she worries that this person will steal her food stamps and personal belongings. The patient stated that she has a 38 year old to take care of and feed and that this is one of the reasons that she needs to leave the hospital. The patient was angry that someone called the ambulance. She felt that she had just "taken the sleeping pills to sleep."  Mrs. Misty Young stated that she plans on sleeping through her hospital stay and does not plan on utilizing this experience as a way of healing. She does not feel that she has a problem. Mrs. Misty Young also stated that she needed a nicotine patch and that she had been wanting to smoke a cigarette.   Please, contact Spiritual Care for further assistance.   Chaplain Clint BolderBrittany Varner M.Div.

## 2016-02-29 NOTE — Discharge Summary (Signed)
Physician Discharge Summary  Misty Young AVW:[id number] DOB: [birth year] DOA: 02/29/2016  PCP: No PCP Per Patient  Admit date: 02/29/2016 Discharge date: 02/29/2016  Admitted From: home Disposition:  home  Recommendations for Outpatient Follow-up:  1. Follow up with PCP in 1-2 weeks  Home Health: none  Equipment/Devices: none   Discharge Condition: stable CODE STATUS: Full Diet recommendation: regular  HPI / Hospital Course: Discharge Diagnoses:  Principal Problem:   Ambien accidental overdose Active Problems:   Generalized anxiety disorder   Overdose   Acute encephalopathy   ADHD (attention deficit hyperactivity disorder)  Patient was admitted earlier this morning for acute encephalopathy in the setting of concern for overdose. She was brought in by EMS as she was unresponsive and appeared to have taken an unknown amount of Ambien, potentially ibuprofen, gabapentin. Her roommate called the EMS regarding patient's altered mental status. She was brought to the emergency room, she was found to have a negative alcohol level, negative Tylenol, negative salicylates, however her UDS was positive for cocaine and marijuana. Poison control was called, who recommended observation for 6-8 hours. Patient did have an episode of hyperthermia of 102.5, no infectious source, Urinalysis clear, chest x-ray clear, possibly related to cocaine or other unknown drugs. Her fever has resolved. She was brought up to step down, when she quickly recovered, she was back to baseline without any further abnormalities and her vital signs. Psychiatry was consulted, evaluated patient and cleared patient for discharge without need for inpatient psychiatric disposition. She has repeated blood work which was unremarkable. Per psychiatry recommendations, " It is unusual to prescribe either Ambien or Adderall for a patient continue to abuse cocaine and week regular basis". Patient was back to baseline, she was  discharged home in stable condition with close outpatient psychiatric follow-up.  Discharge Instructions     Medication List    STOP taking these medications   amphetamine-dextroamphetamine 20 MG tablet Commonly known as:  ADDERALL   buprenorphine-naloxone 8-2 MG Subl SL tablet Commonly known as:  SUBOXONE   promethazine 25 MG suppository Commonly known as:  PHENERGAN     TAKE these medications   dicyclomine 20 MG tablet Commonly known as:  BENTYL Take 1 tablet (20 mg total) by mouth 2 (two) times daily.   hydrocortisone 2.5 % rectal cream Commonly known as:  ANUSOL-HC Place 1 application rectally 2 (two) times daily. UP TO 10 DAYS AT A TIME   polyethylene glycol-electrolytes 420 g solution Commonly known as:  TRILYTE Take 4,000 mLs by mouth as directed.       Allergies  Allergen Reactions  . Tramadol Hcl Other (See Comments)    Hallucinations  . Penicillins Hives    Pt states my mom told me about it, she said when i was a child i got hives when i took it. Tolerates cephalosporins Has patient had a PCN reaction causing immediate rash, facial/tongue/throat swelling, SOB or lightheadedness with hypotension: Unknown Has patient had a PCN reaction causing severe rash involving mucus membranes or skin necrosis: Unknown Has patient had a PCN reaction that required hospitalization: No  Has patient had a PCN reaction occurring within the last 10 years: No  If all of t    Consultations:  Psychiatry  Procedures/Studies:  None  Dg Chest Port 1 View  Result Date: 02/29/2016 CLINICAL DATA:  40 y/o  F; reported fever. EXAM: PORTABLE CHEST 1 VIEW COMPARISON:  07/17/2013 chest radiograph FINDINGS: The heart size and mediastinal contours are within normal limits.  Both lungs are clear. The visualized skeletal structures are unremarkable. Lung bases partially excluded from field of view. IMPRESSION: Lung bases partially excluded from field of view. Visualized lungs are clear.  Electronically Signed   By: Mitzi Hansen M.D.   On: 02/29/2016 05:58      Subjective: - Angry that she is in the hospital, wanting to go home  Discharge Exam: Vitals:   02/29/16 1500 02/29/16 1529  BP:  125/72  Pulse:    Resp: 17 12  Temp:  99.3 F (37.4 C)   Vitals:   02/29/16 1200 02/29/16 1400 02/29/16 1500 02/29/16 1529  BP:    125/72  Pulse: 78     Resp: 13 14 17 12   Temp: 98.4 F (36.9 C)   99.3 F (37.4 C)  TempSrc: Oral   Oral  SpO2: 98%   96%  Weight:      Height:        General: Pt is alert, awake, not in acute distress Cardiovascular: RRR, S1/S2 +, no rubs, no gallops Respiratory: CTA bilaterally, no wheezing, no rhonchi   The results of significant diagnostics from this hospitalization (including imaging, microbiology, ancillary and laboratory) are listed below for reference.     Microbiology: Recent Results (from the past 240 hour(s))  MRSA PCR Screening     Status: None   Collection Time: 02/29/16  7:54 AM  Result Value Ref Range Status   MRSA by PCR NEGATIVE NEGATIVE Final    Comment:        The GeneXpert MRSA Assay (FDA approved for NASAL specimens only), is one component of a comprehensive MRSA colonization surveillance program. It is not intended to diagnose MRSA infection nor to guide or monitor treatment for MRSA infections.      Labs: BNP (last 3 results) No results for input(s): BNP in the last 8760 hours. Basic Metabolic Panel:  Recent Labs Lab 02/29/16 0100 02/29/16 1418  NA 135 139  K 3.0* 3.8  CL 100* 108  CO2 29 27  GLUCOSE 70 98  BUN 12 8  CREATININE 0.92 0.69  CALCIUM 9.2 8.1*   Liver Function Tests:  Recent Labs Lab 02/29/16 0100  AST 15  ALT 13*  ALKPHOS 70  BILITOT 0.5  PROT 7.5  ALBUMIN 4.4   No results for input(s): LIPASE, AMYLASE in the last 168 hours. No results for input(s): AMMONIA in the last 168 hours. CBC:  Recent Labs Lab 02/29/16 0100  WBC 13.1*  HGB 13.1  HCT 38.3    MCV 91.0  PLT 242   Cardiac Enzymes: No results for input(s): CKTOTAL, CKMB, CKMBINDEX, TROPONINI in the last 168 hours. BNP: Invalid input(s): POCBNP CBG:  Recent Labs Lab 02/29/16 0128  GLUCAP 116*   D-Dimer No results for input(s): DDIMER in the last 72 hours. Hgb A1c No results for input(s): HGBA1C in the last 72 hours. Lipid Profile No results for input(s): CHOL, HDL, LDLCALC, TRIG, CHOLHDL, LDLDIRECT in the last 72 hours. Thyroid function studies  Recent Labs  02/29/16 0825  TSH 1.107   Anemia work up No results for input(s): VITAMINB12, FOLATE, FERRITIN, TIBC, IRON, RETICCTPCT in the last 72 hours. Urinalysis    Component Value Date/Time   COLORURINE YELLOW 02/29/2016 0222   APPEARANCEUR CLEAR 02/29/2016 0222   LABSPEC 1.004 (L) 02/29/2016 0222   PHURINE 6.5 02/29/2016 0222   GLUCOSEU NEGATIVE 02/29/2016 0222   HGBUR TRACE (A) 02/29/2016 0222   BILIRUBINUR NEGATIVE 02/29/2016 0222   KETONESUR NEGATIVE 02/29/2016  0222   PROTEINUR NEGATIVE 02/29/2016 0222   UROBILINOGEN 2.0 (H) 03/27/2015 1604   NITRITE NEGATIVE 02/29/2016 0222   LEUKOCYTESUR NEGATIVE 02/29/2016 0222   Sepsis Labs Invalid input(s): PROCALCITONIN,  WBC,  LACTICIDVEN Microbiology Recent Results (from the past 240 hour(s))  MRSA PCR Screening     Status: None   Collection Time: 02/29/16  7:54 AM  Result Value Ref Range Status   MRSA by PCR NEGATIVE NEGATIVE Final    Comment:        The GeneXpert MRSA Assay (FDA approved for NASAL specimens only), is one component of a comprehensive MRSA colonization surveillance program. It is not intended to diagnose MRSA infection nor to guide or monitor treatment for MRSA infections.      Time coordinating discharge: Over 30 minutes  SIGNED:  Pamella PertGHERGHE, COSTIN, MD  [facility] 02/29/2016, 4:46 PM Pager [phone]  If 7PM-7AM, please contact night-coverage [email or web address] Password [email or web address]

## 2016-02-29 NOTE — ED Triage Notes (Signed)
Pt comes to ed via ems, pt overdosed on Ambien unknown dose, 1 gabapentin, and unknown number of ibuprofen within the last two hours. Ems states she kept falling asleep. Her roommate called EMS, pt verbalizes she was not trying to harm herself.  Pt is sinus tachy at 126/78.  Pulse 113, 95 room air, 110 CBG on arrival. [place] [address] [place] [address] [facility] [address]  possible halfway house.   Sleepy, tired, unable to answer questions by ems.

## 2016-02-29 NOTE — H&P (Signed)
History and Physical    Misty Young DOB: 1975-12-20 DOA: 02/29/2016  PCP: Lonia Blood, MD  Patient coming from: home  Chief Complaint: acute encephalopathy  HPI: Misty Young is a 40 y.o. female with medical history significant of ADHD, polysubstance abuse, GAD, presents to the ER with acute encephalopathy likely due to overdose. She was brought in by EMS as she was unresponsive and appears to have overdosed on unknown amount of Ambien, potentially Ibuprofen, gabapentin. She had a prescription of Ambien filled 1-2 days ago and now the bottle is empty. Per EDP, her roommate initially called EMS and stated pt verbalized that she was not trying to harm herself. On my evaluation, patient is lethargic and sleepy however wakes up easily. She denies any medication or drug intake and is quite upset at me suggesting that. Unable to complete accurate rest of ROS due to AMS but answers no to headache, abdominal pain, nausea, vomiting.   ED Course: VS pertinent for temp 102.4, BP stable. Labs with fairly unremarkable BMP, LFTs normal, mild leukocytosis 13.1, tylenol / salicylates negative, UDS positive for cocaine and marijuana. Prior UDS were pan positive. EDP discussed with poison control, recommended admission.  Review of Systems: As per HPI otherwise 10 point review of systems negative.   Past Medical History:  Diagnosis Date  . Attention deficit disorder   . Hemorrhoids    uses Preparation H wipes and cream  . Kidney stones   . Opiate use     Past Surgical History:  Procedure Laterality Date  . CESAREAN SECTION  2002/2004/2009/2013   x4  . INSERTION OF MESH  06/15/2012   Procedure: INSERTION OF MESH;  Surgeon: Ernestene Mention, MD;  Location: [facility];  Service: General;  Laterality: N/A;  . LITHOTRIPSY    . THERAPEUTIC ABORTION    . UMBILICAL HERNIA REPAIR  06/15/2012   Procedure: HERNIA REPAIR UMBILICAL ADULT;  Surgeon: Ernestene Mention, MD;  Location: [facility];   Service: General;  Laterality: N/A;     reports that she has been smoking Cigarettes.  She has a 15.00 pack-year smoking history. She has never used smokeless tobacco. She reports that she drinks alcohol. She reports that she uses drugs, including Marijuana.  Allergies  Allergen Reactions  . Tramadol Hcl Other (See Comments)    Hallucinations  . Penicillins Hives    Pt states my mom told me about it, she said when i was a child i got hives when i took it. Tolerates cephalosporins Has patient had a PCN reaction causing immediate rash, facial/tongue/throat swelling, SOB or lightheadedness with hypotension: Unknown Has patient had a PCN reaction causing severe rash involving mucus membranes or skin necrosis: Unknown Has patient had a PCN reaction that required hospitalization: No  Has patient had a PCN reaction occurring within the last 10 years: No  If all of t    Family History  Problem Relation Age of Onset  . Diabetes Maternal Grandfather   . Colon cancer Neg Hx     Prior to Admission medications   Medication Sig Start Date End Date Taking? Authorizing Provider  amphetamine-dextroamphetamine (ADDERALL) 20 MG tablet Take 20 mg by mouth 3 (three) times daily.   Yes Historical Provider, MD  buprenorphine-naloxone (SUBOXONE) 8-2 MG SUBL SL tablet Place 1 tablet under the tongue daily.   Yes Historical Provider, MD  dicyclomine (BENTYL) 20 MG tablet Take 1 tablet (20 mg total) by mouth 2 (two) times daily. Patient not taking: Reported on  02/29/2016 01/12/16   Gerhard Munch, MD  hydrocortisone (ANUSOL-HC) 2.5 % rectal cream Place 1 application rectally 2 (two) times daily. UP TO 10 DAYS AT A TIME Patient not taking: Reported on 02/29/2016 11/29/15   Anice Paganini, NP  polyethylene glycol-electrolytes (TRILYTE) 420 g solution Take 4,000 mLs by mouth as directed. Patient not taking: Reported on 01/12/2016 11/29/15   [place] Bali, MD  promethazine (PHENERGAN) 25 MG suppository Place 1  suppository (25 mg total) rectally every 6 (six) hours as needed for nausea or vomiting. Patient not taking: Reported on 02/29/2016 01/12/16   Gerhard Munch, MD    Physical Exam: Vitals:   02/29/16 0521 02/29/16 0530 02/29/16 0600 02/29/16 0651  BP: 112/62 129/95 128/80 121/75  Pulse: 113 107 112 96  Resp: 18  18 18   Temp: 102.9 F (39.4 C)   99.6 F (37.6 C)  TempSrc:    Oral  SpO2: 97% 100% 100% 100%  Weight:      Height:          Constitutional: drowsy, frequently falls asleep during interview Vitals:   02/29/16 0521 02/29/16 0530 02/29/16 0600 02/29/16 0651  BP: 112/62 129/95 128/80 121/75  Pulse: 113 107 112 96  Resp: 18  18 18   Temp: 102.9 F (39.4 C)   99.6 F (37.6 C)  TempSrc:    Oral  SpO2: 97% 100% 100% 100%  Weight:      Height:       Eyes: PERRL ENMT: Mucous membranes are moist. Respiratory: clear to auscultation bilaterally, no wheezing, no crackles.  Cardiovascular: Regular rate and rhythm, no murmurs / rubs / gallops. No extremity edema.  Abdomen: no tenderness,  Bowel sounds positive.  Musculoskeletal: no clubbing / cyanosis.  Skin: no rashes Neurologic: non focal  Labs on Admission: I have personally reviewed following labs and imaging studies  CBC:  Recent Labs Lab 02/29/16 0100  WBC 13.1*  HGB 13.1  HCT 38.3  MCV 91.0  PLT 242   Basic Metabolic Panel:  Recent Labs Lab 02/29/16 0100  NA 135  K 3.0*  CL 100*  CO2 29  GLUCOSE 70  BUN 12  CREATININE 0.92  CALCIUM 9.2   GFR: Estimated Creatinine Clearance: 109.7 mL/min (by C-G formula based on SCr of 0.92 mg/dL). Liver Function Tests:  Recent Labs Lab 02/29/16 0100  AST 15  ALT 13*  ALKPHOS 70  BILITOT 0.5  PROT 7.5  ALBUMIN 4.4   No results for input(s): LIPASE, AMYLASE in the last 168 hours. No results for input(s): AMMONIA in the last 168 hours. Coagulation Profile: No results for input(s): INR, PROTIME in the last 168 hours. Cardiac Enzymes: No results for  input(s): CKTOTAL, CKMB, CKMBINDEX, TROPONINI in the last 168 hours. BNP (last 3 results) No results for input(s): PROBNP in the last 8760 hours. HbA1C: No results for input(s): HGBA1C in the last 72 hours. CBG:  Recent Labs Lab 02/29/16 0128  GLUCAP 116*   Lipid Profile: No results for input(s): CHOL, HDL, LDLCALC, TRIG, CHOLHDL, LDLDIRECT in the last 72 hours. Thyroid Function Tests: No results for input(s): TSH, T4TOTAL, FREET4, T3FREE, THYROIDAB in the last 72 hours. Anemia Panel: No results for input(s): VITAMINB12, FOLATE, FERRITIN, TIBC, IRON, RETICCTPCT in the last 72 hours. Urine analysis:    Component Value Date/Time   COLORURINE YELLOW 02/29/2016 0222   APPEARANCEUR CLEAR 02/29/2016 0222   LABSPEC 1.004 (L) 02/29/2016 0222   PHURINE 6.5 02/29/2016 0222   GLUCOSEU NEGATIVE 02/29/2016 0222  HGBUR TRACE (A) 02/29/2016 0222   BILIRUBINUR NEGATIVE 02/29/2016 0222   KETONESUR NEGATIVE 02/29/2016 0222   PROTEINUR NEGATIVE 02/29/2016 0222   UROBILINOGEN 2.0 (H) 03/27/2015 1604   NITRITE NEGATIVE 02/29/2016 0222   LEUKOCYTESUR NEGATIVE 02/29/2016 0222   Sepsis Labs: @LABRCNTIP (procalcitonin:4,lacticidven:4) )No results found for this or any previous visit (from the past 240 hour(s)).   Radiological Exams on Admission: Dg Chest Port 1 View  Result Date: 02/29/2016 CLINICAL DATA:  40 y/o  F; reported fever. EXAM: PORTABLE CHEST 1 VIEW COMPARISON:  07/17/2013 chest radiograph FINDINGS: The heart size and mediastinal contours are within normal limits. Both lungs are clear. The visualized skeletal structures are unremarkable. Lung bases partially excluded from field of view. IMPRESSION: Lung bases partially excluded from field of view. Visualized lungs are clear. Electronically Signed   By: Mitzi HansenLance  Furusawa-Stratton M.D.   On: 02/29/2016 05:58    EKG: Independently reviewed. Sinus rhythm  Assessment/Plan Active Problems:   Generalized anxiety disorder   Overdose   Acute  encephalopathy   ADHD (attention deficit hyperactivity disorder)   Acute encephalopathy - appears to have overdosed, she has a fever / hyperthermia however without apparent source of infection, ?related to unknown drug, ecstasy, cocaine - CXR negative, UA negative, no meningial signs - admit to SDU for OD, supportive treatment with Ativan for agitation and Tylenol for fever, IVF - she is waking up more and more in the ED - psych consult, EDP did IVC patient  ADHD - hold Adderrall  Hypokalemia - replete, repeat BMP in pm   DVT prophylaxis: Lovenox  Code Status: Full  Family Communication: no family bedside Disposition Plan: admit to SDU Consults called: psychiatry  Admission status: obs    Pamella Pertostin Eros Montour, MD [facility] Pager [phone]  If 7PM-7AM, please contact night-coverage [email or web address] Password [email or web address]  02/29/2016, 7:05 AM
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
## 2016-03-03 ENCOUNTER — Encounter: Payer: Self-pay | Admitting: Nurse Practitioner

## 2016-03-03 ENCOUNTER — Ambulatory Visit: Payer: Medicaid Other | Admitting: Nurse Practitioner

## 2016-03-05 LAB — CULTURE, BLOOD (ROUTINE X 2)
Culture: NO GROWTH
Culture: NO GROWTH

## 2016-05-05 ENCOUNTER — Other Ambulatory Visit: Payer: Self-pay | Admitting: Nurse Practitioner

## 2016-05-05 DIAGNOSIS — K649 Unspecified hemorrhoids: Secondary | ICD-10-CM

## 2017-09-02 ENCOUNTER — Ambulatory Visit: Admit: 2017-09-02 | Discharge: 2017-09-02 | Payer: BLUE CROSS/BLUE SHIELD

## 2017-09-02 DIAGNOSIS — R945 Abnormal results of liver function studies: Secondary | ICD-10-CM

## 2017-09-02 MED ORDER — ZOLPIDEM ER 12.5 MG TABLET,EXTENDED RELEASE,MULTIPHASE
12.5 | ORAL | Status: AC | PRN
Start: 2017-09-02 — End: ?

## 2017-09-02 MED ORDER — GLYCOPYRROLATE 2 MG TABLET
2 | ORAL | Status: AC
Start: 2017-09-02 — End: ?

## 2017-09-02 MED ORDER — MELATONIN-GABA-VALERIAN 6 MG-30 MG-50 MG TABLET
6-30-50 | ORAL | Status: AC
Start: 2017-09-02 — End: ?

## 2017-09-02 NOTE — Patient Instructions (Addendum)
It was nice meeting you!  - We would like to check fasting labs for a variety of causes that could explain your elevated liver function tests.  - We recommend that you have an abdominal ultrasound. Please call radiology to schedule.  - At some point we will check a fibroscan (to look for fibrosis)  - Please sign-up for mychart as a way to stay in touch with us.      Front desk:  - Help with mychart  - Radiology number

## 2017-09-02 NOTE — Progress Notes (Addendum)
Jillian Nixon is a 42 y.o. female seen for an initial consultation at the request of Dr. Maudie Mercury for abnormal liver function tests.    History of Present Illness:  Jillian Nixon is a 42 y.o. female who is here for evaluation of abnormal liver function tests. The patient has no known significant health issues. In the fall/winter of 2018 she developed nausea and abdominal pain. This was in the setting of moving back to [place] after living in [place] for a few months. Her primary care physician ordered labs including liver function tests. Her AST/ALT were elevated to 100 and 131. Repeat liver function tests showed continued elevation in this range. She also had a mildly elevated fasting glucose of 105. Her bilirubin and alk phos were normal. She had a total iron drawn of 197 with % saturation of 56 and ferritin of 176.     She had a hereditary hemochromatosis evaluation that was negative for C282Y and H63D mutations.     The patient is adopted and does not know her family history. She was born in [place] and came to the [place] when she was 63 months old.    The patient currently reports that she feels well. Her abdominal pain resolved after moving back to [facility] and with eating a regular diet. She denies jaundice, scleral icterus, ascites, swelling, nausea, vomiting, hematochezia, melena, confusion. She drinks alcohol on occasion, but not more than 3-4 drinks in one sitting and usually on a couple nights a week. She drinks herbal tea bought in a tea shop, but denies other herbal supplements. She denies frequent tylenol use. She denies recent weight gain or loss. She denies fevers or chills.     Past Medical History:   Diagnosis Date    Hyperhidrosis     Insomnia        Past Surgical History:   Procedure Laterality Date    REMOVAL BREAST IMPLANT         Current Outpatient Medications   Medication Sig Dispense Refill    glycopyrrolate (ROBINUL) 2 mg tablet Take 2 mg by mouth 2 (two) times daily.       melatonin-gaba-valerian 01-03-49 mg TAB Take by mouth.      zolpidem (AMBIEN CR) 12.5 mg ER tablet Take 12.5 mg by mouth nightly as needed for Sleep.       No current facility-administered medications for this visit.        Allergies: She has No Known Allergies.    Social History:  Social History     Socioeconomic History    Marital status: Unknown/Declined     Spouse name: Not on file    Number of children: Not on file    Years of education: Not on file    Highest education level: Not on file   Social Needs    Financial resource strain: Not on file    Food insecurity - worry: Not on file    Food insecurity - inability: Not on file    Transportation needs - medical: Not on file    Transportation needs - non-medical: Not on file   Occupational History    Not on file   Tobacco Use    Smoking status: Never Smoker    Smokeless tobacco: Never Used   Substance and Sexual Activity    Alcohol use: Yes     Drinks per session: 1 or 2     Binge frequency: Daily or almost daily     Comment:  6 drinks    Drug use: Not Currently    Sexual activity: Yes     Partners: Male     Birth control/protection: IUD   Other Topics Concern    Not on file   Social History Narrative    Works in [employer] History: Her family history is not on file. She was adopted.  Family history is otherwise negative or as noted above.     REVIEW OF SYMPTOMS:  Constitutional: No fevers  Eye problems: No scleral icterus   Ear/Nose/Throat: No sore throat   Respiratory: No SOB  Heart: Denies chest pain  Kidney/Bladder: Denies dysuria   Neurological: Denies changes in strength or sensation   Skin: Denies jaundice  Musculoskeletal: Denies joint pain  GI/Liver: Per HPI      Physical Exam:  Vital Signs: BP 127/78 (BP Location: Left upper arm, Patient Position: Sitting, Cuff Size: Adult)   Pulse 75   Temp 36.2 C (97.2 F) (Oral)   Ht 167.6 cm (5' 6" )   Wt 60.8 kg (134 lb)   SpO2 98%   BMI 21.63 kg/m   Constitutional: She is in  no apparent distress.  Eyes: Anicteric sclerae.  Ears, Nose, Mouth, Throat: Moist mucous membranes, no oral lesions.  Respiratory: Clear lungs to auscultation bilaterally.  Cardiovascular: Normal S1 and S2, regular rate and rhythm, no murmurs, rubs or gallops. No peripheral edema.  Gastrointestinal: Soft, nontender abdomen without hepatosplenomegaly, hernias or masses.  Hem/Lymphatic: No cervical or supraclavicular lymphadenopathy.  Musculoskeletal: No clubbing or cyanosis of hands. Normal range of motion in upper and lower extremities.  Skin: No cutaneous stigmata of chronic liver disease, no rashes.  Neurologic: No asterixis.  Psychiatric: Alert and oriented to person, place and time. Normal affect.    Records Review: I have personally reviewed records from PCP. These are summarized within the history of present illness.    Labs: I have personally reviewed and interpreted the following laboratory studies:    Latest [facility]:            She had a total iron of 197 with 56% saturation and ferritin of 176.     She had a hereditary hemochromatosis evaluation that was negative for C282Y and H63D.      ASSESSMENT AND PLAN:  Jillian Nixon is a 42 y.o. female who is here for evaluation of abnormal liver function tests.     Abnormal liver enzymes (new problem to me):  Largely hepatocellular in nature first diagnosed in 05-2017.  Differential diagnosis remains very broad and includes DILI (although history not consistent), chronic viral hepatitis (risk factors being born in [place]). She is adopted and family history is not known), NAFLD,  autoimmune hepatitis, wilson's and Celiac. Highly unlikely to have hemochromatosis (negative C282Y/H63D (though we note other mutations not tested for can lead to iron overload) but only mildly elevated ferritin/TF saturation). We will begin by sending a broad work-up and obtain an abdominal ultrasound. If the work-up is unrevealing and her liver function tests remain elevated for 6  months, liver biopsy will be considered.  -- Fasting cholesterol panel, Hga1c, fasting glucose  - Hepatitis A IgG, hepatitis B core antibody, hepatitis B surface antigen, hepatitis B surface antibody  - Hep C antibody  - ANA, ASMA  - Ceruloplasmin  - Alpha 1 antitrypsin  - TTAG, total IgA  - Abdominal ultrasound   - Further work-up including the need for liver biopsy pending the work-up  above    Assessment of liver fibrosis: The gold standard for assessing liver fibrosis is liver biopsy. However in 10-2012 the FDA approved Fibroscan to assess fibrosis in a non invasive manner.  Fibroscan has been shown to assess fibrosis in patients with various liver diseases and correlates well with liver biopsy.  It does not assess inflammation of the liver nor the cause of the liver fibrosis.   The first step in assessing fibrosis will be a Fibroscan.  If further assessment of cause of liver disease, site of fibrosis or amount of infammation is required then a liver biopsy will be needed.  Fibroscan cannot be performed until ALT is <100 as high ALT will falsely elevate fibrosis score.    Vaccination: I recommend vaccination for hepatitis A and B if not already immune.    Follow-up in via mychart after labs/ultrasound are completed.     An After Visit Summary was printed and given to the patient.    A copy of this consultation letter was faxed/routed to Dr. Maudie Mercury.

## 2017-09-25 ENCOUNTER — Ambulatory Visit: Admit: 2017-09-25 | Discharge: 2017-09-26 | Payer: BLUE CROSS/BLUE SHIELD | Attending: Diagnostic Radiology

## 2017-09-25 ENCOUNTER — Ambulatory Visit: Admit: 2017-09-25 | Discharge: 2017-09-25 | Payer: BLUE CROSS/BLUE SHIELD

## 2017-09-25 DIAGNOSIS — R945 Abnormal results of liver function studies: Secondary | ICD-10-CM

## 2017-09-25 LAB — HEPATITIS B CORE ANTIBODY, IGM: Hep B Core Ab IgM: POSITIVE — AB

## 2017-09-25 LAB — CHOLESTEROL, LDL (INCL. TOT. A
Chol HDL Ratio: 3.1 (ref <6.0)
Cholesterol, HDL: 52 mg/dL (ref >39)
Cholesterol, Total: 163 mg/dL (ref <200)
LDL Cholesterol: 100 mg/dL (ref <130)
Non HDL Cholesterol: 111 mg/dL (ref <160)
Triglycerides, serum: 53 mg/dL (ref <200)

## 2017-09-25 LAB — BILIRUBIN, DIRECT: Bilirubin, Direct: 0.3 mg/dL — ABNORMAL HIGH (ref <0.3)

## 2017-09-25 LAB — ALPHA-1-ANTITRYPSIN, SERUM: Alpha-1-Antitrypsin, serum: 127 mg/dL (ref 79–207)

## 2017-09-25 LAB — BASIC METABOLIC PANEL, FASTING
Anion Gap: 8 (ref 4–14)
Calcium, total, Serum / Plasma: 8.8 mg/dL (ref 8.8–10.3)
Carbon Dioxide, Total: 22 mmol/L (ref 22–32)
Chloride, Serum / Plasma: 107 mmol/L (ref 97–108)
Creatinine: 0.74 mg/dL (ref 0.44–1.00)
Glucose, fasting: 104 mg/dL — ABNORMAL HIGH (ref 70–99)
Potassium, Serum / Plasma: 3.8 mmol/L (ref 3.5–5.1)
Sodium, Serum / Plasma: 137 mmol/L (ref 135–145)
Urea Nitrogen, Serum / Plasma: 9 mg/dL (ref 6–22)
eGFR - high estimate: 117 mL/min (ref >60)
eGFR - low estimate: 101 mL/min (ref >60)

## 2017-09-25 LAB — IGG, SERUM: IgG, serum: 1720 mg/dL (ref 672–1760)

## 2017-09-25 LAB — ALKALINE PHOSPHATASE: Alkaline Phosphatase: 68 U/L (ref 31–95)

## 2017-09-25 LAB — BILIRUBIN, TOTAL: Bilirubin, Total: 1.3 mg/dL (ref 0.2–1.3)

## 2017-09-25 LAB — CERULOPLASMIN: Ceruloplasmin: 23.4 mg/dL (ref 19.0–68.0)

## 2017-09-25 LAB — HEPATITIS B SURFACE ANTIBODY,: Hep B surf Ab, Quant: <5 m[IU]/mL

## 2017-09-25 LAB — HEPATITIS C ANTIBODY: Hep C Ab, Qual: NEGATIVE

## 2017-09-25 LAB — ALANINE TRANSAMINASE: Alanine transaminase: 156 U/L — ABNORMAL HIGH (ref 11–50)

## 2017-09-25 LAB — GAMMA-GLUTAMYL TRANSPEPTIDASE: Gamma-Glutamyl Transpeptidase: 71 U/L — ABNORMAL HIGH (ref 7–37)

## 2017-09-25 LAB — ASPARTATE TRANSAMINASE: AST: 113 U/L — ABNORMAL HIGH (ref 17–42)

## 2017-09-26 LAB — HEPATITIS B SURFACE ANTIGEN: Hep B surf Ag: REACTIVE — AB

## 2017-09-28 LAB — HEPATITIS B CORE ANTIBODY, TOT: Hep B Core Ab Total: POSITIVE — AB

## 2017-09-29 LAB — ANTI-NUCLEAR ANTIBODIES: Anti-Nuclear Antibodies: POSITIVE (ref <40)

## 2017-09-29 LAB — HBSAG CONFIRMATION: Hep B surf Ag Confirm: POSITIVE

## 2017-09-29 LAB — ANA TITER: ANA Titer: 40 TITER

## 2017-09-29 LAB — ANA PATTERN

## 2017-09-30 LAB — SMOOTH MUSCLE ANTIBODIES: Smooth Muscle Antibody: 23 UNITS — ABNORMAL HIGH (ref <20)

## 2017-10-07 ENCOUNTER — Ambulatory Visit: Admit: 2017-10-07 | Discharge: 2017-10-07 | Payer: BLUE CROSS/BLUE SHIELD

## 2017-10-07 DIAGNOSIS — R945 Abnormal results of liver function studies: Secondary | ICD-10-CM

## 2017-10-07 NOTE — Progress Notes (Signed)
October 07, 2017     Jillian NapoleonChristine Nixon [id number]     Probe: M+  ICD10: R94.5     Indication (underlying disease/condition): Abnormal liver function tests       FIBROSCAN 502     Median Stiffness: 21.2 kPa  Interquartile stiffness range/med: 8 %  (Goal <30%)  Number of Valid Measurements: 10  Total Number of Measurements: 10    Controlled Attenuation Parameter (CAP) :  Median 215 IQR range: 33     Success rate: 100 %    (Goal >60%)                  Scan reliable: Yes     Fasting: Yes                                             Alcohol: No       Procedure Performed by: ADELOSREYES    Interpretation:   Fibrosis cirrhosis just in range of portal hypertension (>20)      Controlled Attenuation Parameter (CAP): indicates no severe steatosis  CAP >300 dB/M indicates severe steatosis       Assessment of liver fibrosis:  The gold standard for assessing liver fibrosis is liver biopsy.  However in 10-2012 the FDA approved Fibroscan to assess fibrosis in a noninvasive manner.  Fibroscan has been shown to assess fibrosis in patients with various liver diseases and correlates well with liver biopsy.  Fibroscan does not assess inflammation of the liver nor the cause of the liver fibrosis. The first step is assessing fibrosis will be a Fibroscan. If further assessment of cause of liver disease, site of fibrosis or amount of inflammation is required then a liver biopsy will be needed.    Interpretation performed by: Erling CruzMarion Peters MD       Fibroscan results can be affected by non-fasting state (fast 3 hours), high ALT (>100 U/mL), hepatic congestion, steatohepatitis, alcohol, and cholestasis.      Fibroscan Rationale:  The Food and Drug Administration (FDA) has approved market clearance for Fibroscan, a novel medical device that measures liver stiffness. Vibration controlled elastography with Fibroscan uses a modified ultrasound probe to measure the velocity of a shear wave created by vibratory source. Establishing the presence of fibrosis  or cirrhosis in patients with chronic liver disease is important for assessment of prognosis and for evidence of progressive disease (fibrosis). The diagnosis of cirrhosis is important to initiate screening for varices and liver cancer. Fibroscan can be used as a noninvasive alternative to a liver biopsy.    Procedure:  After verbal explanation, the patient was placed in the supine position with right hand above head. Pre-measurement data confirmed the transient elastography probe was centered over the liver parenchyma. 2D image and elastrogram were evaluated for good transmission and positioning. A series of ten 50Hz  mechanical pulses were applied with controlled application pressure to induce a mechanical shear wave in the liver tissue. For each measurement, the shear wave propagation speed was detected, displayed and converted to its equivalent liver stiffness value in kilopascals. Skin to liver capsule distance and shear wave characteristics were monitored during the entire examination to assure data quality. Median liver stiffness measurement and interquartile range were calculated and displayed in real time. Acquired measurement data was stored and submitted to the physician for review and interpretation.  Patient tolerated the procedure well and was  discharged without incident.

## 2017-10-26 ENCOUNTER — Ambulatory Visit: Admit: 2017-10-26 | Discharge: 2017-10-26 | Payer: BLUE CROSS/BLUE SHIELD

## 2017-10-26 DIAGNOSIS — R748 Abnormal levels of other serum enzymes: Secondary | ICD-10-CM

## 2017-10-26 DIAGNOSIS — B181 Chronic viral hepatitis B without delta-agent: Secondary | ICD-10-CM

## 2017-10-26 LAB — COMPLETE BLOOD COUNT WITH DIFF
Abs Basophils: 0.04 10*9/L (ref 0.0–0.1)
Abs Eosinophils: 0.14 10*9/L (ref 0.0–0.4)
Abs Imm Granulocytes: 0.02 10*9/L (ref <0.1)
Abs Lymphocytes: 1.89 10*9/L (ref 1.0–3.4)
Abs Monocytes: 0.4 10*9/L (ref 0.2–0.8)
Abs Neutrophils: 3.71 10*9/L (ref 1.8–6.8)
Hematocrit: 44.3 % (ref 36–46)
Hemoglobin: 15.3 g/dL (ref 12.0–15.5)
MCH: 34 pg (ref 26–34)
MCHC: 34.5 g/dL (ref 31–36)
MCV: 98 fL (ref 80–100)
Platelet Count: 117 10*9/L — ABNORMAL LOW (ref 140–450)
RBC Count: 4.5 10*12/L (ref 4.0–5.2)
WBC Count: 6.2 10*9/L (ref 3.4–10)

## 2017-10-26 LAB — ALKALINE PHOSPHATASE: Alkaline Phosphatase: 68 U/L (ref 31–95)

## 2017-10-26 LAB — IGG, SERUM: IgG, serum: 1550 mg/dL (ref 672–1760)

## 2017-10-26 LAB — BILIRUBIN, DIRECT: Bilirubin, Direct: 0.2 mg/dL (ref <0.3)

## 2017-10-26 LAB — ASPARTATE TRANSAMINASE: AST: 62 U/L — ABNORMAL HIGH (ref 17–42)

## 2017-10-26 LAB — ALANINE TRANSAMINASE: Alanine transaminase: 95 U/L — ABNORMAL HIGH (ref 11–50)

## 2017-10-26 LAB — PROTHROMBIN TIME
Int'l Normaliz Ratio: 1.2 (ref 0.9–1.2)
PT: 14.8 s (ref 11.8–14.8)

## 2017-10-26 LAB — GAMMA-GLUTAMYL TRANSPEPTIDASE: Gamma-Glutamyl Transpeptidase: 55 U/L — ABNORMAL HIGH (ref 7–37)

## 2017-10-26 LAB — HIV ANTIBODY AND ANTIGEN COMBI: HIV Ag/Ab Combo: NEGATIVE

## 2017-10-28 LAB — HEPATITIS B E ANTIGEN: Hep B e Ag: POSITIVE

## 2017-10-28 LAB — HEPATITIS B DNA, QUANTITATIVE
Hep B DNA (IU/mL): 3049824 IU/mL — ABNORMAL HIGH (ref <10)
Hep B DNA (Log IU): 6.49 — ABNORMAL HIGH (ref <1.00)

## 2017-10-28 LAB — HEPATITIS B E ANTIBODY: Hep B e Ab: NEGATIVE

## 2017-10-30 LAB — HEPATITIS D ANTIBODY: Hep D Ab: NEGATIVE

## 2017-11-03 LAB — HEPATITIS B VIRUS DRUG RESISTA
HBV BCP Mutations: DETECTED
HBV Polymerase Mutations: DETECTED
HBV Precore Mutations: NOT DETECTED

## 2017-11-04 ENCOUNTER — Ambulatory Visit: Admit: 2017-11-04 | Discharge: 2017-11-04 | Payer: BLUE CROSS/BLUE SHIELD

## 2017-11-04 DIAGNOSIS — B181 Chronic viral hepatitis B without delta-agent: Secondary | ICD-10-CM

## 2017-11-04 DIAGNOSIS — R7309 Other abnormal glucose: Secondary | ICD-10-CM

## 2017-11-04 NOTE — Progress Notes (Signed)
Jillian Nixon is a 42 y.o. female seen for an initial consultation at the request of Jillian Nixon for abnormal liver function tests.    History of Present Illness:  Jillian Nixon is a 42 y.o. female who is here for evaluation of abnormal liver function tests. The patient has no known significant health issues. In the fall/winter of 2018 she developed nausea and abdominal pain. This was in the setting of moving back to [place] after living in [place] for a few months. Her primary care physician ordered labs including liver function tests. Her AST/ALT were elevated to 100 and 131. Repeat liver function tests showed continued elevation in this range. She also had a mildly elevated fasting glucose of 105. Her bilirubin and alk phos were normal. She had a total iron drawn of 197 with % saturation of 56 and ferritin of 176.     She had a hereditary hemochromatosis evaluation that was negative for C282Y and H63D mutations.     The patient is adopted and does not know her family history. She was born in [place] and came to the [place] when she was 91 months old.    The patient currently reports that she feels well. Her abdominal pain resolved after moving back to [facility] and with eating a regular diet. She denies jaundice, scleral icterus, ascites, swelling, nausea, vomiting, hematochezia, melena, confusion. She drinks alcohol on occasion, but not more than 3-4 drinks in one sitting and usually on a couple nights a week. She drinks herbal tea bought in a tea shop, but denies other herbal supplements. She denies frequent tylenol use. She denies recent weight gain or loss. She denies fevers or chills.     INTERVAL HISTORY: she was positive for HBsAg and came in for discussion of her results. I had contacted her by phone and my chart to get more labs since her last visit to document the type and phase of HBV and other coinfections    Past Medical History:   Diagnosis Date    Hyperhidrosis     Insomnia         Past Surgical History:   Procedure Laterality Date    REMOVAL BREAST IMPLANT         Current Outpatient Medications   Medication Sig Dispense Refill    glycopyrrolate (ROBINUL) 2 mg tablet Take 2 mg by mouth 2 (two) times daily.      melatonin-gaba-valerian 01-03-49 mg TAB Take by mouth.      zolpidem (AMBIEN CR) 12.5 mg ER tablet Take 12.5 mg by mouth nightly as needed for Sleep.       No current facility-administered medications for this visit.        Allergies: She has No Known Allergies.    Social History:  Social History     Socioeconomic History    Marital status: Unknown/Declined     Spouse name: Not on file    Number of children: Not on file    Years of education: Not on file    Highest education level: Not on file   Social Needs    Financial resource strain: Not on file    Food insecurity - worry: Not on file    Food insecurity - inability: Not on file    Transportation needs - medical: Not on file    Transportation needs - non-medical: Not on file   Occupational History    Not on file   Tobacco Use    Smoking status:  Never Smoker    Smokeless tobacco: Never Used   Substance and Sexual Activity    Alcohol use: Yes     Drinks per session: 1 or 2     Binge frequency: Daily or almost daily     Comment: 6 drinks    Drug use: Not Currently    Sexual activity: Yes     Partners: Male     Birth control/protection: IUD   Other Topics Concern    Not on file   Social History Narrative    Works in [employer] History: Her family history is not on file. She was adopted.  Family history is otherwise negative or as noted above.     REVIEW OF SYMPTOMS:  Constitutional: No fevers  Eye problems: No scleral icterus   Ear/Nose/Throat: No sore throat   Respiratory: No SOB  Heart: Denies chest pain  Kidney/Bladder: Denies dysuria   Neurological: Denies changes in strength or sensation   Skin: Denies jaundice  Musculoskeletal: Denies joint pain  GI/Liver: Per HPI      Physical Exam:  Vital Signs: BP  120/87 (BP Location: Left upper arm, Patient Position: Sitting, Cuff Size: Adult)   Pulse 87   Temp 36.7 C (98 F) (Oral)   Ht 165.1 cm (5' 5" )   Wt 59.9 kg (132 lb)   SpO2 97%   BMI 21.97 kg/m   Constitutional: She is in no apparent distress.  Eyes: Anicteric sclerae.  Ears, Nose, Mouth, Throat: Moist mucous membranes, no oral lesions.  Respiratory: Clear lungs to auscultation bilaterally.  Cardiovascular: Normal S1 and S2, regular rate and rhythm, no murmurs, rubs or gallops. No peripheral edema.  Gastrointestinal: Soft, nontender abdomen without hepatosplenomegaly, hernias or masses.  Hem/Lymphatic: No cervical or supraclavicular lymphadenopathy.  Musculoskeletal: No clubbing or cyanosis of hands. Normal range of motion in upper and lower extremities.  Skin: a few spider nevi on her skin No other cutaneous stigmata of chronic liver disease, no rashes.  Neurologic: No asterixis.  Psychiatric: Alert and oriented to person, place and time. Normal affect.    Records Review: I have personally reviewed records from PCP. These are summarized within the history of present illness.    Labs: I have personally reviewed and interpreted the following laboratory studies:    Latest [facility]:  Results for Jillian Nixon (MRN [id number]) as of 11/06/2017 09:53   Ref. Range 09/25/2017 09:12 10/26/2017 08:09   Sodium Latest Ref Range: 135 - 145 mmol/L 137    Potassium Latest Ref Range: 3.5 - 5.1 mmol/L 3.8    Chloride Latest Ref Range: 97 - 108 mmol/L 107    CO2 Latest Ref Range: 22 - 32 mmol/L 22    Urea Nitrogen, Serum / Plasma Latest Ref Range: 6 - 22 mg/dL 9    Creatinine Latest Ref Range: 0.44 - 1.00 mg/dL 0.74    Glucose, fasting Latest Ref Range: 70 - 99 mg/dL 104 (H)    Anion Gap Latest Ref Range: 4 - 14  8    eGFR if non-African American Latest Ref Range: >60 mL/min 101    eGFR if African Amer Latest Ref Range: >60 mL/min 117    Calcium Latest Ref Range: 8.8 - 10.3 mg/dL 8.8    Aspartate transaminase Latest Ref  Range: 17 - 42 U/L 113 (H) 62 (H)   ALT Latest Ref Range: 11 - 50 U/L 156 (H) 95 (H)   Alkaline Phosphatase Latest Ref Range:  31 - 95 U/L 68 68   Bilirubin, Total Latest Ref Range: 0.2 - 1.3 mg/dL 1.3    Bilirubin, Direct Latest Ref Range: <0.3 mg/dL 0.3 (H) 0.2   GGT Latest Ref Range: 7 - 37 U/L 71 (H) 55 (H)   Ceruloplasmin Latest Ref Range: 19.0 - 68.0 mg/dL 23.4    Cholesterol, Total Latest Ref Range: <200 mg/dL 163    Triglycerides, serum Latest Ref Range: <200 mg/dL 53    Cholesterol, HDL Latest Ref Range: >39 mg/dL 52    LDL Cholesterol Latest Ref Range: <130 mg/dL 100    Chol HDL Ratio Latest Ref Range: <6.0  3.1    Non HDL Cholesterol Latest Ref Range: <160 mg/dL 111    IgG, serum Latest Ref Range: 672 - 1,760 mg/dL 1,720 1,550   WBC Count Latest Ref Range: 3.4 - 10 x10E9/L  6.2   RBC Count Latest Ref Range: 4.0 - 5.2 x10E12/L  4.50   HEMOGLOBIN (HGB) Latest Ref Range: 12.0 - 15.5 g/dL  15.3   Hematocrit Latest Ref Range: 36 - 46 %  44.3   MCV Latest Ref Range: 80 - 100 fL  98   MCH Latest Ref Range: 26 - 34 pg  34.0   MCHC Latest Ref Range: 31 - 36 g/dL  34.5   Platelet Count Latest Ref Range: 140 - 450 x10E9/L  117 (L)   Neutrophil Absolute Count Latest Ref Range: 1.8 - 6.8 x10E9/L  3.71   Lymphocyte Abs Cnt Latest Ref Range: 1.0 - 3.4 x10E9/L  1.89   Monocyte Abs Count Latest Ref Range: 0.2 - 0.8 x10E9/L  0.40   Eosinophil Abs Ct Latest Ref Range: 0.0 - 0.4 x10E9/L  0.14   Basophil Abs Count Latest Ref Range: 0.0 - 0.1 x10E9/L  0.04   Imm Gran, Left Shift Latest Ref Range: <0.1 x10E9/L  0.02   PT Latest Ref Range: 11.8 - 14.8 s  14.8   Int'l Normaliz Ratio Latest Ref Range: 0.9 - 1.2   1.2   Alpha-1-Antitrypsin, serum Latest Ref Range: 79 - 207 mg/dL 127    ANA Pattern Latest Units: PATTERN Speckled    ANA Titer Latest Units: TITER 40    Titer 1 Latest Ref Range: <40  POS    Smooth Muscle Antibody Latest Ref Range: <20 UNITS 23 (H)    HBV Real Time Latest Ref Range: <10 IU/mL  1,610,960 (H)   HBV Log  IU/mL Latest Ref Range: <1.00   6.49 (H)   HBsAg Confirmation Unknown Positive for HBs ...    Hepatitis B Surface Antibody, Quantitative Latest Units: mIU/mL <5    Hepatitis B Core Antibody, IgM Latest Ref Range: NEG  POS (A)    Hepatitis B Core Antibody, Total Latest Ref Range: NEG  POS (A)    Hepatitis B e Antibody Latest Ref Range: Negative   Negative   Hepatitis B e Antigen Latest Ref Range: Negative   Positive   Hepatitis B Surface Antigen Latest Ref Range: NEG  Screen reactive. ... (A)    Hepatitis C Antibody Latest Ref Range: NEG  NEG    HIV Ag/Ab Combo Latest Ref Range: NEG   NEG   Hepatitis D Antibody Unknown  NEGATIVE   HBV BCP Mutations Unknown  DETECTED   HBV Genotype Unknown  C   HBV Polymerase Mutations Unknown  DETECTED   HBV Precore Mutations Unknown  NOT DETECTED           She had  a total iron of 197 with 56% saturation and ferritin of 176.   She had a hereditary hemochromatosis evaluation that was negative for C282Y and H63D.     US Abdomen Complete (radiology Performed)    Result Date: 09/25/2017  1. Coarse hepatic parenchyma, a nonspecific finding that can be seen in a variety of diffuse liver diseases. Recommend correlation with serologies and relevant laboratory markers. No focal hepatic mass or contour nodularity. Report dictated by: Haskell Flirt, MD PhD, signed by: Carmelina Peal, MD Department of Radiology and Biomedical Imaging    Fibroscan 10-07-2017  21.2 Kpa with CAP 215     ASSESSMENT AND PLAN:  Jillian Nixon is a 42 y.o. female who is here for evaluation of abnormal liver function tests.     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 She states ALT has been elevated for some time. We spent 20 minutes discussing the phases of HBV. She is not immune tolerant as she has elevated ALT. So she has immune active HBeAg positive disease. Sometimes patients with this phase can go on to  become inactive and she would not need therapy.  - I will obtain her records from her PCP to determine the length of time she has had elevated ALT.  If at least 6 months she needs to be treat ed now  - repeat HBV DNA in next couple of weeks.    Possible cirrhosis: She has a few spider nevi and a coarse ultrasound with low platelets and fibroscan 21.2 (when ALT over 100).  This may be a false positive or truly cirrhosis. Fibroscan will  Have to be repeated when ALT normal    Health maintenance: she is negative for HCV , HDV and HIV.She has elevated IgG but it is coming down so likely reated to active HBV. She does not have wilson's, Celiac, hemochromatosis (negative C282Y/H63D . She has normal Fasting cholesterol panel,but mildly elevated fasting glucose     She needs  - Hepatitis A IgG,   - Hgb A1c   - 25 OH vit D  -Repeat IgG when HBV treated    Assessment of liver fibrosis: The gold standard for assessing liver fibrosis is liver biopsy. However in 10-2012 the FDA approved Fibroscan to assess fibrosis in a non invasive manner.  Fibroscan has been shown to assess fibrosis in patients with various liver diseases and correlates well with liver biopsy.  It does not assess inflammation of the liver nor the cause of the liver fibrosis.   The first step in assessing fibrosis will be a Fibroscan.  If further assessment of cause of liver disease, site of fibrosis or amount of infammation is required then a liver biopsy will be needed.  Fibroscan should be repeated when ALT is <100 as high ALT will falsely elevate fibrosis score.    Vaccination: I recommend vaccination for hepatitis A and B if not already immune.  - check Hep A IgG    Follow-up in via mychart after labs are completed.     An After Visit Summary was printed and given to the patient.    A copy of this consultation letter was faxed/routed to Jillian Nixon.       Answers for HPI/ROS submitted by the patient on 10/28/2017   fatigue: No  fever: No  Frequent pain or  distress: No  Insomnia: Yes  anorexia:  No    ADDENDUM: I received records form Dr Maudie Nixon showing abnormal ALT since 04-2017. We will repeat HBV DNA now and then start treatment with entecavir.  Video visit after labs available

## 2017-11-04 NOTE — Patient Instructions (Addendum)
You have chronic hepatitis B  It is active at the moment  Dr Noe Gens will obtain your old labs from [facility] from 2018 and 2019. If you have had high ALT and HBV DNA for over 6 months you should be treated  You should have another HBV DNA end of May 2019 after may 19  You need an ultrasound every year  You need a fibroscan to tests for scarring once your ALT is lower. The one you had in April 2019 was falsely elevated because your ALT was high. We will repeat this once your ALT is low    If you need treatment we will check your liver tests and HBV DNA every 3 months for one year and HBeAg and anti-HBe every 6 months. You will need to be treated for 2 years after losing HBeAg and gaining anti-HBe    Patient Education - Hepatitis B (HBV):   Natural History: The natural history of Hepatitis B infection was discussed from 1) the immunotolerant phase, not requiring therapy to 2) active viral replication and necroinflammatory disease, which may require liver biopsy and treatment and 3) in some patients progression to ongoing fibrosis and cirrhosis and hepatocellular carcinoma. Patients may remain in the early phase and not progress.   Liver Cancer: Screening for liver cancer (hepatocellular carcinoma) should begin at age 34 for men and 89 for women unless there is a family history of liver cancer (in which case it should begin immediately). Screening consists of liver imaging (ultrasound, CT scan, or MRI) and serum alpha feto-protein every 6 months.   Contacts: Family members and sexual contacts should be screened and vaccinated as appropriate.  Patients should not share personal items (e.g., razor, toothbrush, needle/syringe) that might have blood on them. Recommend use of barrier protection for non-immune sexual partners  Risk factors for flare: Patients should avoid or report use of steroid, immunosuppressives, chemotherapy or biologic response modifiers as these can lead to severe reactivation of Hepatitis B. Please  contact me immediately if you require any of these medications.    Dear patient      We have scheduled a Video Visit for you:        With: Dr. Noe Gens     Prior to your visit:    1. Please, download [employer] from [employer] (for I phones) , or [employer] ( for Android)      2. Prepare your smart phone and test it ahead of time.     On the day of your visit:     5 minutes before your scheduled appointment, click on Zoom icon to open the application, and click on "[employer]".    Type Meeting ID: [id number] and tap "Join".    Allow or OK for any prompts that come up regarding your camera or microphone, so provider can see and hear you.    You will be placed in a virtual waiting room until the doctor joins the meeting.        You must be in [place] at the time of this visit. By accepting this invitation, you consent to hold this visit by video. A video visit billed in the same way as an in-person clinic visit. The terms of your deductible and copay still apply. You always have the option to request an in-person appointment instead of, or following, a video visit.    Please let us know if you have any questions.    Thank you!  [phone]

## 2017-11-26 ENCOUNTER — Ambulatory Visit: Admit: 2017-11-26 | Discharge: 2017-11-26 | Payer: BLUE CROSS/BLUE SHIELD

## 2017-11-26 DIAGNOSIS — B181 Chronic viral hepatitis B without delta-agent: Secondary | ICD-10-CM

## 2017-11-26 DIAGNOSIS — R7309 Other abnormal glucose: Secondary | ICD-10-CM

## 2017-11-26 LAB — HEPATITIS A VIRUS ANTIBODY IGG: Hep A Ab IgG: POSITIVE — AB

## 2017-11-26 LAB — HEMOGLOBIN A1C: Hemoglobin A1c: 5.2 PERCENT (ref 4.3–5.6)

## 2017-11-26 LAB — ALANINE TRANSAMINASE: Alanine transaminase: 82 U/L — ABNORMAL HIGH (ref 11–50)

## 2017-11-26 LAB — ASPARTATE TRANSAMINASE: AST: 54 U/L — ABNORMAL HIGH (ref 17–42)

## 2017-11-26 LAB — VITAMIN D, 25-HYDROXY: Vitamin D, 25-Hydroxy: 11 ng/mL — ABNORMAL LOW (ref 20–50)

## 2017-12-02 LAB — HEPATITIS B DNA, QUANTITATIVE
Hep B DNA (IU/mL): 2471246 IU/mL — ABNORMAL HIGH (ref <10)
Hep B DNA (Log IU): 6.39 — ABNORMAL HIGH (ref <1.00)

## 2017-12-09 ENCOUNTER — Ambulatory Visit: Admit: 2017-12-09 | Discharge: 2017-12-09 | Payer: BLUE CROSS/BLUE SHIELD

## 2017-12-09 DIAGNOSIS — K7400 Hepatic fibrosis, unspecified: Secondary | ICD-10-CM

## 2017-12-09 DIAGNOSIS — E559 Vitamin D deficiency, unspecified: Secondary | ICD-10-CM

## 2017-12-09 DIAGNOSIS — D696 Thrombocytopenia, unspecified: Secondary | ICD-10-CM

## 2017-12-09 DIAGNOSIS — B181 Chronic viral hepatitis B without delta-agent: Secondary | ICD-10-CM

## 2017-12-09 DIAGNOSIS — R7309 Other abnormal glucose: Secondary | ICD-10-CM

## 2017-12-09 NOTE — Progress Notes (Signed)
I performed this consultation using real-time Telehealth tools, including a live video connection between my location and the patient's location. Prior to initiating the consultation, I obtained informed verbal consent to perform this consultation using Telehealth tools and answered all the questions about the Telehealth interaction.      History of Present Illness:  Jillian Nixon is a 42 y.o. female who came for evaluation of abnormal liver function tests and was found to have HBV. he is positive for HBsAg and is HBeAg positive with high HBV DNA and elevated ALT for 8 months.  I received records form Dr Maudie Mercury showing abnormal ALT since 04-2017.     The patient has no known significant health issues. In the fall/winter of 2018 she developed nausea and abdominal pain. This was in the setting of moving back to [place] after living in [place] for a few months. Her primary care physician ordered labs including liver function tests. Her AST/ALT were elevated to 100 and 131. Repeat liver function tests showed continued elevation in this range. She also had a mildly elevated fasting glucose of 105. Her bilirubin and alk phos were normal. She had a total iron drawn of 197 with % saturation of 56 and ferritin of 176. She had a hereditary hemochromatosis evaluation that was negative for C282Y and H63D mutations.     The patient is adopted and does not know her family history. She was born in [place] and came to the [place] when she was 38 months old.    The patient currently reports that she feels well. Her abdominal pain resolved after moving back to [facility] and with eating a regular diet. She denies jaundice, scleral icterus, ascites, swelling, nausea, vomiting, hematochezia, melena, confusion. She drinks alcohol on occasion, but not more than 3-4 drinks in one sitting and usually on a couple nights a week. She drinks herbal tea bought in a tea shop, but denies other herbal supplements. She denies frequent  tylenol use. She denies recent weight gain or loss. She denies fevers or chills.     INTERVAL HISTORY: she was positive for HBsAg and has had active disease for more than 6 months. I received records form Dr Maudie Mercury showing abnormal ALT since 04-2017. I repeated HBV DNA and ALT and both are elevated so she shold start treatment with entecavir.  No new complaints      Past Medical History:   Diagnosis Date    Hyperhidrosis     Insomnia        Past Surgical History:   Procedure Laterality Date    REMOVAL BREAST IMPLANT       Current Medications       Dosage    glycopyrrolate (ROBINUL) 2 mg tablet Take 2 mg by mouth 2 (two) times daily.    melatonin-gaba-valerian 01-03-49 mg TAB Take by mouth.    zolpidem (AMBIEN CR) 12.5 mg ER tablet Take 12.5 mg by mouth nightly as needed for Sleep.        Allergies: She has No Known Allergies.    Social History:  Social History     Socioeconomic History    Marital status: Unknown/Declined     Spouse name: Not on file    Number of children: Not on file    Years of education: Not on file    Highest education level: Not on file   Occupational History    Not on file   Social Needs    Financial resource strain: Not on  file    Food insecurity:     Worry: Not on file     Inability: Not on file    Transportation needs:     Medical: Not on file     Non-medical: Not on file   Tobacco Use    Smoking status: Never Smoker    Smokeless tobacco: Never Used   Substance and Sexual Activity    Alcohol use: Yes     Drinks per session: 1 or 2     Binge frequency: Daily or almost daily     Comment: 6 drinks    Drug use: Not Currently    Sexual activity: Yes     Partners: Male     Birth control/protection: IUD   Lifestyle    Physical activity:     Days per week: Not on file     Minutes per session: Not on file    Stress: Not on file   Relationships    Social connections:     Talks on phone: Not on file     Gets together: Not on file     Attends religious service: Not on file     Active  member of club or organization: Not on file     Attends meetings of clubs or organizations: Not on file     Relationship status: Not on file    Intimate partner violence:     Fear of current or ex partner: Not on file     Emotionally abused: Not on file     Physically abused: Not on file     Forced sexual activity: Not on file   Other Topics Concern    Not on file   Social History Narrative    Works in [employer] History: Her family history is not on file. She was adopted.    REVIEW OF SYMPTOMS:  Constitutional: No fevers  Eye problems: No scleral icterus   Ear/Nose/Throat: No sore throat   Respiratory: No SOB  Heart: Denies chest pain  Kidney/Bladder: Denies dysuria   Neurological: Denies changes in strength or sensation   Skin: Denies jaundice  Musculoskeletal: Denies joint pain  GI/Liver: Per HPI    Physical Exam:  Vital Signs: There were no vitals taken for this visit.  Constitutional: She is in no apparent distress.  Eyes: Anicteric sclerae.  Neurologic: No asterixis.  Psychiatric: Alert and oriented to person, place and time. Normal affect.    Records Review: I have personally reviewed records from PCP. These are summarized within the history of present illness.    Labs: I have personally reviewed and interpreted the following laboratory studies:  Results for KENNDRA, MORRIS (MRN [id number]) as of 12/14/2017 08:51   Ref. Range 11/26/2017 07:52   Aspartate transaminase Latest Ref Range: 17 - 42 U/L 54 (H)   ALT Latest Ref Range: 11 - 50 U/L 82 (H)   Vitamin D, 25-Hydroxy Latest Ref Range: 20 - 50 ng/mL 11 (L)   Hemoglobin A1c Latest Ref Range: 4.3 - 5.6 PERCENT 5.2   HBV Real Time Latest Ref Range: <10 IU/mL 8,413,244 (H)   HBV Log IU/mL Latest Ref Range: <1.00  6.39 (H)   Hepatitis A virus Antibody IgG Latest Ref Range: NEG  POS (A)     Results for DALIA, JOLLIE (MRN [id number]) as of 11/06/2017 09:53   Ref. Range 09/25/2017 09:12 10/26/2017 08:09   Sodium Latest Ref Range: 135 - 145 mmol/L 137  Potassium Latest Ref Range: 3.5 - 5.1 mmol/L 3.8    Chloride Latest Ref Range: 97 - 108 mmol/L 107    CO2 Latest Ref Range: 22 - 32 mmol/L 22    Urea Nitrogen, Serum / Plasma Latest Ref Range: 6 - 22 mg/dL 9    Creatinine Latest Ref Range: 0.44 - 1.00 mg/dL 0.74    Glucose, fasting Latest Ref Range: 70 - 99 mg/dL 104 (H)    Anion Gap Latest Ref Range: 4 - 14  8    eGFR if non-African American Latest Ref Range: >60 mL/min 101    eGFR if African Amer Latest Ref Range: >60 mL/min 117    Calcium Latest Ref Range: 8.8 - 10.3 mg/dL 8.8    Aspartate transaminase Latest Ref Range: 17 - 42 U/L 113 (H) 62 (H)   ALT Latest Ref Range: 11 - 50 U/L 156 (H) 95 (H)   Alkaline Phosphatase Latest Ref Range: 31 - 95 U/L 68 68   Bilirubin, Total Latest Ref Range: 0.2 - 1.3 mg/dL 1.3    Bilirubin, Direct Latest Ref Range: <0.3 mg/dL 0.3 (H) 0.2   GGT Latest Ref Range: 7 - 37 U/L 71 (H) 55 (H)   Ceruloplasmin Latest Ref Range: 19.0 - 68.0 mg/dL 23.4    Cholesterol, Total Latest Ref Range: <200 mg/dL 163    Triglycerides, serum Latest Ref Range: <200 mg/dL 53    Cholesterol, HDL Latest Ref Range: >39 mg/dL 52    LDL Cholesterol Latest Ref Range: <130 mg/dL 100    Chol HDL Ratio Latest Ref Range: <6.0  3.1    Non HDL Cholesterol Latest Ref Range: <160 mg/dL 111    IgG, serum Latest Ref Range: 672 - 1,760 mg/dL 1,720 1,550   WBC Count Latest Ref Range: 3.4 - 10 x10E9/L  6.2   RBC Count Latest Ref Range: 4.0 - 5.2 x10E12/L  4.50   HEMOGLOBIN (HGB) Latest Ref Range: 12.0 - 15.5 g/dL  15.3   Hematocrit Latest Ref Range: 36 - 46 %  44.3   MCV Latest Ref Range: 80 - 100 fL  98   MCH Latest Ref Range: 26 - 34 pg  34.0   MCHC Latest Ref Range: 31 - 36 g/dL  34.5   Platelet Count Latest Ref Range: 140 - 450 x10E9/L  117 (L)   Neutrophil Absolute Count Latest Ref Range: 1.8 - 6.8 x10E9/L  3.71   Lymphocyte Abs Cnt Latest Ref Range: 1.0 - 3.4 x10E9/L  1.89   Monocyte Abs Count Latest Ref Range: 0.2 - 0.8 x10E9/L  0.40   Eosinophil Abs Ct Latest Ref  Range: 0.0 - 0.4 x10E9/L  0.14   Basophil Abs Count Latest Ref Range: 0.0 - 0.1 x10E9/L  0.04   Imm Gran, Left Shift Latest Ref Range: <0.1 x10E9/L  0.02   PT Latest Ref Range: 11.8 - 14.8 s  14.8   Int'l Normaliz Ratio Latest Ref Range: 0.9 - 1.2   1.2   Alpha-1-Antitrypsin, serum Latest Ref Range: 79 - 207 mg/dL 127    ANA Pattern Latest Units: PATTERN Speckled    ANA Titer Latest Units: TITER 40    Titer 1 Latest Ref Range: <40  POS    Smooth Muscle Antibody Latest Ref Range: <20 UNITS 23 (H)    HBV Real Time Latest Ref Range: <10 IU/mL  1,308,657 (H)   HBV Log IU/mL Latest Ref Range: <1.00   6.49 (H)   HBsAg Confirmation Unknown Positive for  HBs ...    Hepatitis B Surface Antibody, Quantitative Latest Units: mIU/mL <5    Hepatitis B Core Antibody, IgM Latest Ref Range: NEG  POS (A)    Hepatitis B Core Antibody, Total Latest Ref Range: NEG  POS (A)    Hepatitis B e Antibody Latest Ref Range: Negative   Negative   Hepatitis B e Antigen Latest Ref Range: Negative   Positive   Hepatitis B Surface Antigen Latest Ref Range: NEG  Screen reactive. ... (A)    Hepatitis C Antibody Latest Ref Range: NEG  NEG    HIV Ag/Ab Combo Latest Ref Range: NEG   NEG   Hepatitis D Antibody Unknown  NEGATIVE   HBV BCP Mutations Unknown  DETECTED   HBV Genotype Unknown  C   HBV Polymerase Mutations Unknown  DETECTED   HBV Precore Mutations Unknown  NOT DETECTED           She had a total iron of 197 with 56% saturation and ferritin of 176.   She had a hereditary hemochromatosis evaluation that was negative for C282Y and H63D.     US Abdomen Complete (radiology Performed)    Result Date: 09/25/2017  1. Coarse hepatic parenchyma, a nonspecific finding that can be seen in a variety of diffuse liver diseases. Recommend correlation with serologies and relevant laboratory markers. No focal hepatic mass or contour nodularity. Report dictated by: Haskell Flirt, MD PhD, signed by: Carmelina Peal, MD Department of Radiology and Biomedical Imaging     Fibroscan 10-07-2017  21.2 Kpa with CAP 215     ASSESSMENT AND PLAN:  Shevy Yaney is a 42 y.o. female who is here for evaluation of Hepatitis B.     Immune active HBeAg positive Chronic hepatitis B ongoing:  She is positive for HBsAg and is HBeAg positive with high HBV DNA and elevated ALT for 8 months.  I received records form Dr Maudie Mercury showing abnormal ALT since 04-2017. Repeat HBV DNA and ALT are both elevated so I will start treatment with entecavir. Side effects of ETV discussed with patient including abdominal pain and hair loss. She is not planning on having children and understands that she would need to change medication to TDF if she wishes to get pregnant  - ETV 0.5 mg daily ordered  - labs one month after starting ETV and then every 3 months  - HBeAg and anti-HBe every 6 months. Once she has lost HBeAg she should continue tretament for at least 2 years before stopping as she is over 33 years of age and with only one year consolidation is likely to relapse    Possible cirrhosis: She has a few spider nevi and a coarse ultrasound with low platelets and fibroscan 21.2 (when ALT over 100).  This may be a false positive or truly cirrhosis (I suspect the latter). Fibroscan will have to be repeated when ALT normal.    Thrombocytopenia:  If she has cirrhosis she will require endoscopy as her platelets are low.     Assessment of liver fibrosis: The gold standard for assessing liver fibrosis is liver biopsy. However in 10-2012 the FDA approved Fibroscan to assess fibrosis in a non invasive manner.  Fibroscan has been shown to assess fibrosis in patients with various liver diseases and correlates well with liver biopsy.  It does not assess inflammation of the liver nor the cause of the liver fibrosis.   The first step in assessing fibrosis will be a Fibroscan.  If further  assessment of cause of liver disease, site of fibrosis or amount of infammation is required then a liver biopsy will be needed.  - repeat  fibroscan when ALT normal    Health maintenance: she is negative for HCV , HDV and HIV and hepatitis A immune.She has elevated IgG but it is coming down so likely reated to active HBV. She does not have wilson's, Celiac, hemochromatosis (negative C282Y/H63D . She has normal Fasting cholesterol panel,but mildly elevated fasting glucose but normal Hgb A1c  -Repeat IgG when HBV treated    The vitamin D level is low. She will be placed on 50,000 units once per week for 8 weeks followed by Vitamin D3 2000 units daily for one month. Then 25-OK Vitamin D should be rechecked. If still low she may need rocatrol.    Assessment of liver fibrosis: The gold standard for assessing liver fibrosis is liver biopsy. However in 10-2012 the FDA approved Fibroscan to assess fibrosis in a non invasive manner.  Fibroscan has been shown to assess fibrosis in patients with various liver diseases and correlates well with liver biopsy.  It does not assess inflammation of the liver nor the cause of the liver fibrosis.   The first step in assessing fibrosis will be a Fibroscan.  If further assessment of cause of liver disease, site of fibrosis or amount of infammation is required then a liver biopsy will be needed.  Fibroscan should be repeated when ALT is <100 as high ALT will falsely elevate fibrosis score.    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  The most common inherited disorder of bilirubin glucuronidation is Rosanna Randy syndrome, which is characterized by recurrent episodes of jaundice due to unconjugated hyperbilirubinemia. Rosanna Randy syndrome is the result of a defect in the promotor of the gene that encodes the enzyme uridine diphosphoglucuronate-glucuronosyltransferase 1A1, which is responsible for the conjugation of bilirubin with glucuronic acid. With the exception of intermittent episode of jaundice, most patients with Rosanna Randy syndrome are asymptomatic and have normal physical examination findings. Laboratory testing  reveals unconjugated hyperbilirubinemia, with total bilirubin levels that are usually less than 3 mg/dL, though in the setting of increased bilirubin production the levels may be higher. A presumptive diagnosis of Rosanna Randy syndrome may be made in patients with unconjugated hyperbilirubinemia on repeated testing who have a normal complete blood count, blood smear, reticulocyte count, plasma aminotransferase concentrations, and alkaline phosphatase concentration. The diagnosis is definitive in patients who continue to have normal laboratory studies (other than the elevation in plasma bilirubin) during the next 12 to 18 months. No specific therapy is required for patients with Rosanna Randy syndrome. The most important aspect of the care of these patients is recognition of the disorder and its benign nature. Its mode of inheritance should also be discussed to prevent unnecessary testing in family members.           Follow-up in via mychart after labs are completed.     An After Visit Summary was printed and given to the patient.    A copy of this consultation letter was faxed/routed to Dr. Maudie Mercury.

## 2017-12-14 MED ORDER — ENTECAVIR 0.5 MG TABLET
0.5 | ORAL_TABLET | Freq: Every day | ORAL | 3 refills | Status: DC
Start: 2017-12-14 — End: 2018-11-30

## 2017-12-14 MED ORDER — ERGOCALCIFEROL (VITAMIN D2) 1,250 MCG (50,000 UNIT) CAPSULE
50000 | ORAL_CAPSULE | ORAL | 0 refills | Status: AC
Start: 2017-12-14 — End: 2018-02-02

## 2017-12-14 MED ORDER — ERGOCALCIFEROL (VITAMIN D2) 50 MCG (2,000 UNIT) TABLET
50 | ORAL_TABLET | Freq: Every day | ORAL | 1 refills | Status: DC
Start: 2017-12-14 — End: 2018-02-25

## 2018-02-08 ENCOUNTER — Ambulatory Visit: Admit: 2018-02-08 | Discharge: 2018-02-08 | Payer: BLUE CROSS/BLUE SHIELD

## 2018-02-08 DIAGNOSIS — B181 Chronic viral hepatitis B without delta-agent: Secondary | ICD-10-CM

## 2018-02-08 LAB — PROTHROMBIN TIME
Int'l Normaliz Ratio: 1.1 (ref 0.9–1.2)
PT: 14.2 s (ref 11.8–14.8)

## 2018-02-08 LAB — ASPARTATE TRANSAMINASE: AST: 41 U/L (ref 17–42)

## 2018-02-08 LAB — BILIRUBIN, TOTAL: Bilirubin, Total: 0.8 mg/dL (ref 0.2–1.3)

## 2018-02-08 LAB — CREATININE, SERUM / PLASMA
Creatinine: 0.8 mg/dL (ref 0.44–1.00)
eGFR - high estimate: 107 mL/min (ref >60)
eGFR - low estimate: 92 mL/min (ref >60)

## 2018-02-08 LAB — ALANINE TRANSAMINASE: Alanine transaminase: 55 U/L — ABNORMAL HIGH (ref 11–50)

## 2018-02-08 LAB — SODIUM, SERUM / PLASMA: Sodium, Serum / Plasma: 141 mmol/L (ref 135–145)

## 2018-02-08 LAB — ALBUMIN, SERUM / PLASMA: Albumin, Serum / Plasma: 4 g/dL (ref 3.5–4.8)

## 2018-02-12 LAB — HEPATITIS B DNA, QUANTITATIVE
Hep B DNA (IU/mL): 608 IU/mL — ABNORMAL HIGH (ref <10)
Hep B DNA (Log IU): 2.78 — ABNORMAL HIGH (ref <1.00)

## 2018-02-25 MED ORDER — ERGOCALCIFEROL (VITAMIN D2) 50 MCG (2,000 UNIT) TABLET
50 | ORAL_TABLET | Freq: Every day | ORAL | 1 refills | Status: AC
Start: 2018-02-25 — End: ?

## 2018-02-25 NOTE — Telephone Encounter (Signed)
Hi Dr. Bufford ButtnerMehta,  Patient has an appointment with you on 10/4, can you please authorize the refill?  Thank you,  Zel

## 2018-03-24 ENCOUNTER — Other Ambulatory Visit: Payer: Self-pay

## 2018-03-24 ENCOUNTER — Encounter: Payer: Self-pay | Admitting: Nurse Practitioner

## 2018-03-24 ENCOUNTER — Ambulatory Visit (INDEPENDENT_AMBULATORY_CARE_PROVIDER_SITE_OTHER): Payer: Self-pay | Admitting: Nurse Practitioner

## 2018-03-24 ENCOUNTER — Telehealth: Payer: Self-pay

## 2018-03-24 VITALS — BP 115/82 | HR 109 | Temp 97.2°F | Ht 71.0 in | Wt 255.2 lb

## 2018-03-24 DIAGNOSIS — K625 Hemorrhage of anus and rectum: Secondary | ICD-10-CM

## 2018-03-24 DIAGNOSIS — K649 Unspecified hemorrhoids: Secondary | ICD-10-CM

## 2018-03-24 MED ORDER — HYDROCORTISONE 2.5 % RE CREA: 1.0000 "application " | TOPICAL_CREAM | Freq: Two times a day (BID) | RECTAL | 1 refills | Status: DC

## 2018-03-24 NOTE — Telephone Encounter (Signed)
Tried to call pt to inform of pre-op appt 05/05/18 at 11:00am, no answer, LMOAM. Letter mailed.

## 2018-03-24 NOTE — Progress Notes (Signed)
Referring Provider: No ref. provider found Primary Care Physician:  Patient, No Pcp Per Primary GI:  Dr. Darrick Penna  Chief Complaint  Patient presents with  . Hemorrhoids    c/o some bleeding, itching, burning, discomfort    HPI:   Misty Young is a 42 y.o. female who presents for follow-up on hemorrhoids.  The patient was last seen in our office 11/29/2015 for rectal bleeding, hemorrhoids, abnormal liver CT, abnormal LFTs.  Patient previously presented to reschedule colonoscopy.  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  Previously refused banding wanting surgical resection.  Recommended ultrasound elastography to evaluate shunting further if found no evidence of cirrhosis and likely congenital and can be discussed with vascular surgery.  However, the ultrasound was never completed.  Her previously recommended colonoscopy in 2015 was canceled requesting a date in 2016.  She never followed up.  At her last visit she noted still occasional bleeding, typically on the stool most recently as the week prior.  Abdominal pain in the lower abdomen.  On Suboxone treatment and they started her on Movantik.  Occasional nausea which is improved with Zofran, no vomiting.  Stools quite variable from [facility] 1-7, dark.  On Prilosec with breakthrough GERD symptoms every 2 weeks.  Frequent hemorrhoid symptoms.  Saw surgeon but they did not accept her insurance.  No other GI symptoms.  Again recommended liver ultrasound.  Also recommended CMP for liver enzymes.  Also recommended colonoscopy with possible hemorrhoid banding, follow-up in 3 months.  Patient canceled her colonoscopy.  There is a phone note indicating she has moved to [facility] and was requesting transfer from our office to the [facility] but they declined to accept due  to no schedule availability.  He was admitted on 02/29/2016 for intentional overdose likely Adderall and Ambien.  Her urine drug screen was also positive for cocaine and marijuana.  She was discharged when her mental status resolved with close psychiatry follow-up.  Today she states she's not well. She's sleepy because she's been up for 3 days with her hemorrhoids. She states she didn't cancel her colonoscopy; states she lost her Medicaid. They looked at her hemorrhoids this weekend and compared to a video on YouTube and felt they were thrombosed. She previously had a thrombosed hemorrhoid previously which was drained at [facility]. Is using the hemorrhoid cream (Preparation H) with no relief. She's been using ice and Sitz baths as well to help resolve her symptoms. Hasn't used the bathroom in 4-5 days. Has drank 3 cups of MoM, Metamucil.  They have Amitiza and Linzess but not wanting to mix that with her OTC medications. Denies abdominal pain, N/V, melena, hematochezia (due to no BM in 4-5 days). Did take Oxcodone for pain 5 mg over the weekend. Denies chest pain, dyspnea, dizziness, lightheadedness, syncope, near syncope. Denies any other upper or lower GI symptoms.  She is not on Suboxone any more.  More than 25 minutes was spent with the patient of which 50%+ was spend on education and care coordination activities.  Past Medical History:  Diagnosis Date  . Attention deficit disorder   . Hemorrhoids    uses Preparation H wipes and cream  . Kidney stones   . Opiate use     Past Surgical History:  Procedure Laterality Date  . CESAREAN SECTION  2002/2004/2009/2013   x4  . INSERTION OF MESH  06/15/2012   Procedure: INSERTION OF MESH;  Surgeon: Ernestene MentionHaywood M Ingram, MD;  Location: [facility];  Service: General;  Laterality: N/A;  . LITHOTRIPSY    . THERAPEUTIC ABORTION    . UMBILICAL HERNIA REPAIR  06/15/2012   Procedure: HERNIA REPAIR UMBILICAL ADULT;  Surgeon: Ernestene MentionHaywood M Ingram, MD;  Location: [facility];   Service: General;  Laterality: N/A;    Current Outpatient Medications  Medication Sig Dispense Refill  . amphetamine-dextroamphetamine (ADDERALL) 30 MG tablet Take 30 mg by mouth 2 (two) times daily.    Marland Kitchen. aspirin EC 81 MG tablet Take 81 mg by mouth daily.    . diazepam (VALIUM) 10 MG tablet Take 10 mg by mouth 2 (two) times daily.    . Multiple Vitamin (MULTIVITAMIN) tablet Take 1 tablet by mouth daily.    Marland Kitchen. omeprazole (PRILOSEC) 20 MG capsule Take 20 mg by mouth 2 (two) times daily before a meal.    . valACYclovir (VALTREX) 500 MG tablet Take 500 mg by mouth daily.    Marland Kitchen. VITAMIN E PO Take 1 tablet by mouth daily.    Marland Kitchen. zolpidem (AMBIEN) 10 MG tablet Take 10 mg by mouth at bedtime.     No current facility-administered medications for this visit.     Allergies as of 03/24/2018 - Review Complete 03/24/2018  Allergen Reaction Noted  . Tramadol hcl Other (See Comments) 03/28/2015  . Penicillins Hives 01/27/2011    Family History  Problem Relation Age of Onset  . Diabetes Maternal Grandfather   . Colon cancer Neg Hx     Social History   Socioeconomic History  . Marital status: Married    Spouse name: Not on file  . Number of children: 4  . Years of education: Not on file  . Highest education level: Not on file  Occupational History  . Occupation: unemployed  Social Needs  . Financial resource strain: Not on file  . Food insecurity:    Worry: Not on file    Inability: Not on file  . Transportation needs:    Medical: Not on file    Non-medical: Not on file  Tobacco Use  . Smoking status: Current Every Day Smoker    Packs/day: 1.00    Years: 15.00    Pack years: 15.00    Types: Cigarettes  . Smokeless tobacco: Never Used  Substance and Sexual Activity  . Alcohol use: Not Currently    Alcohol/week: 0.0 standard drinks    Frequency: Never    Comment: Denies ETOH (03/24/18); previously: occasionally  . Drug use: Yes    Types: Marijuana    Comment: Last used opioids in  6-7 months (as of 03/24/18)  . Sexual activity: Not Currently    Birth control/protection: Surgical  Lifestyle  . Physical activity:    Days per week: Not on file    Minutes per session: Not on file  . Stress: Not on file  Relationships  . Social connections:    Talks on phone: Not on file    Gets together: Not on file    Attends religious service: Not on file    Active member of club or organization: Not on file    Attends meetings of clubs or organizations: Not on file    Relationship status: Not on file  Other Topics Concern  . Not on file  Social History Narrative  . Not on file    Review of Systems:  Complete ROS negative except as per HPI.   Physical Exam: BP 115/82   Pulse (!) 109   Temp (!) 97.2 F (36.2 C) (Oral)   Ht 5\' 11"  (1.803 m)   Wt 255 lb 3.2 oz (115.8 kg)   LMP 03/12/2018 (Approximate)   BMI 35.59 kg/m  General:   Alert and oriented. Pleasant and cooperative. Well-nourished and well-developed.  Eyes:  Without icterus, sclera clear and conjunctiva pink.  Ears:  Normal auditory acuity. Cardiovascular:  S1, S2 present without murmurs appreciated. Extremities without clubbing or edema. Respiratory:  Clear to auscultation bilaterally. No wheezes, rales, or rhonchi. No distress.  Gastrointestinal:  +BS, soft, non-tender and non-distended. No HSM noted. No guarding or rebound. No masses appreciated.  Rectal:  Large but deflated external hemorrhoids noted, no thrombosed hemorrhoids. Internal hemorrhoids appreciated as well. Some tenderness/pain on rectal exam. No obvious blood.  Musculoskalatal:  Symmetrical without gross deformities. Neurologic:  Alert and oriented x4;  grossly normal neurologically. Psych:  Alert and cooperative. Normal mood and affect. Heme/Lymph/Immune: No excessive bruising noted.    03/24/2018 8:29 AM   Disclaimer: This note was dictated with voice recognition software. Similar sounding words can inadvertently be transcribed and may  not be corrected upon review.

## 2018-03-24 NOTE — Patient Instructions (Signed)
1. Complete the common financial assistance paperwork as soon as possible. 2. We will schedule your colonoscopy for you. 3. In the meantime, I sent in Anusol to your pharmacy.  You can use this up to twice a day for up to 10 days at a time. 4. You can also use sitz bath's and ice packs to help as well. 5. If you feel like you have a thrombosed hemorrhoid due to severe pain and a dark purple hemorrhoid you would need to proceed to the ER to have it drained. 6. Return for follow-up in 3 months. 7. Call us if you have any questions or concerns.  At [facility] we value your feedback. You may receive a survey about your visit today. Please share your experience as we strive to create trusting relationships with our patients to provide genuine, compassionate, quality care.  We appreciate your understanding and patience as we review any laboratory studies, imaging, and other diagnostic tests that are ordered as we care for you. Our office policy is 5 business days for review of these results, and any emergent or urgent results are addressed in a timely manner for your best interest. If you do not hear from our office in 1 week, please contact us.   We also encourage the use of MyChart, which contains your medical information for your review as well. If you are not enrolled in this feature, an access code is on this after visit summary for your convenience. Thank you for allowing us to be involved in your care.  It was great to see you today!  I hope you have a great Fall!!

## 2018-03-24 NOTE — Patient Instructions (Signed)
EG advised pt would need urine drug screen at pre-op appt prior to TCS. LMOVM to inform Endo scheduler and order entered.

## 2018-03-26 NOTE — Assessment & Plan Note (Signed)
The patient has a history of significant hemorrhoids with rectal bleeding.  Her previous colonoscopy was canceled because she lost her insurance (Medicaid).  Her husband looked at her hemorrhoids this weekend and compared it to online videos and felt there were thrombosed.  She has previously had a thrombosed hemorrhoid that was drained in the emergency department at [facility].  She is using over-the-counter Preparation H without relief.  She is also using ice packs and sits baths as well.  Has significant constipation which is likely worsening her symptoms.  She does have Amitiza and Linzess at home but is not wanting to mix that with her over-the-counter medicines including Metamucil and milk of magnesia.  On rectal exam she does have deflated but large hemorrhoids externally.  Also felt what appears to be some internal hemorrhoids as well.  Due to rectal bleeding we will plan for colonoscopy to further evaluate.  I discussed that external hemorrhoids can only be obliterated with surgery.  If she has thrombosed hemorrhoids (symptoms associated with thrombosed hemorrhoids were discussed) she should present to the emergency department.  Recommend she fill out a cone financial assistance form as well.  Will up in 3 months, continue sitz bath and ice packs.  Anusol sent to her pharmacy.

## 2018-03-26 NOTE — Assessment & Plan Note (Signed)
Rectal bleeding in the setting of known hemorrhoids.  She was previously scheduled to have a colonoscopy but had to be canceled because of losing her Medicaid.  I have recommended she fill out cone financial assistance paperwork to help with her medical bills.  Further hemorrhoid treatment as per above.  I will set her up for colonoscopy at this time.  Return for follow-up in 3 months.  Proceed with colonoscopy on propofol/MAC with Dr. Oneida Alar in the near future. The risks, benefits, and alternatives have been discussed in detail with the patient. They state understanding and desire to proceed.   The patient is currently on Valium, Adderall, Ambien.  No other anticoagulants, anxiolytics, chronic pain medications, or antidepressants.  History of opioid usage.  Current marijuana usage.  We will plan for the procedure on propofol/MAC to promote adequate sedation.

## 2018-04-09 ENCOUNTER — Ambulatory Visit: Admit: 2018-04-09 | Discharge: 2018-04-09 | Payer: BLUE CROSS/BLUE SHIELD

## 2018-04-09 DIAGNOSIS — K7400 Hepatic fibrosis, unspecified: Secondary | ICD-10-CM

## 2018-04-09 DIAGNOSIS — B181 Chronic viral hepatitis B without delta-agent: Secondary | ICD-10-CM

## 2018-04-09 DIAGNOSIS — R7309 Other abnormal glucose: Secondary | ICD-10-CM

## 2018-04-09 DIAGNOSIS — D696 Thrombocytopenia, unspecified: Secondary | ICD-10-CM

## 2018-04-09 NOTE — Patient Instructions (Addendum)
You have chronic hepatitis B  It is active at the moment though this is improving with Entecavir  You should have another lab draw in December and March or early April  You need an ultrasound around every year so this would next be due in March 2020 which I ordered today if you could schedule  You need a fibroscan now that your liver enzymes are better, we can do this on the same date as your next appointment in 6 months  Please start over the counter calcium/vitamin D supplement    Fibroscan order    1. Please order fibroscan at front desk  2. You must be fasting for 3 hours before fibroscan: no food or drink.   A cup of water is OK.  3. Your appointment will be at [address]. Please arrive 10 minutes before your appointment.  4. You doctor will receive a notice of the interpreted result within 5 business days  5. Your insurance will be billed. You may have to pay out of pocket. Information will be given to you at the front desk when you make your appointment.

## 2018-04-09 NOTE — Progress Notes (Signed)
Jillian Nixon is a 42 y.o. female seen in follow up consultation at the request of Dr. Maudie Mercury for abnormal liver function tests found to be Hep B sAg positive.    History of Present Illness:  Jillian Nixon is a 42 y.o. female who is here for evaluation of abnormal liver function tests. The patient has no known significant health issues. In the fall/winter of 2018 she developed nausea and abdominal pain. This was in the setting of moving back to [place] after living in [place] for a few months. Her primary care physician ordered labs including liver function tests. Her AST/ALT were elevated to 100 and 131. Repeat liver function tests showed continued elevation in this range. She also had a mildly elevated fasting glucose of 105. Her bilirubin and alk phos were normal.  She had a total iron drawn of 197 with % saturation of 56 and ferritin of 176. Testing showed that she was positive for HBsAg with HBV DNA >2,000,000 IU/ml in May 2019 for which she was started on Entecavir.  On 02/08/18, her AST was 41 and ALT 55 with HBV DNA down to 608 IU/ml.  She had a hereditary hemochromatosis evaluation that was negative for C282Y and H63D mutations.     The patient is adopted and does not know her family history. She was born in [place] and came to the [place] when she was 22 months old.    The patient currently reports that she feels well. Her abdominal pain resolved after moving back to [facility] and with eating a regular diet. She denies jaundice, scleral icterus, ascites, swelling, nausea, vomiting, hematochezia, melena, confusion. She drinks alcohol on occasion, but not more than 3-4 drinks in one sitting and usually on a couple nights a week. She drinks herbal tea bought in a tea shop, but denies other herbal supplements. She denies frequent tylenol use. She denies recent weight gain or loss. She denies fevers or chills.     Past Medical History:   Diagnosis Date    Hyperhidrosis     Insomnia      Past  Surgical History:   Procedure Laterality Date    REMOVAL BREAST IMPLANT       Medication Sig    entecavir (BARACLUDE) 0.5 mg tablet Take 1 tablet (0.5 mg total) by mouth Daily.    ergocalciferol, vitamin D2, 2,000 unit TAB Take 2,000 Units by mouth Daily.     glycopyrrolate (ROBINUL) 2 mg tablet Take 2 mg by mouth 2 (two) times daily.    melatonin-gaba-valerian 01-03-49 mg TAB Take by mouth.    zolpidem (AMBIEN CR) 12.5 mg ER tablet Take 12.5 mg by mouth nightly as needed for Sleep.     Allergies: She has No Known Allergies.    Social History:  Socioeconomic History    Marital status: Unknown/Declined   Tobacco Use    Smoking status: Never Smoker    Smokeless tobacco: Never Used   Substance and Sexual Activity    Alcohol use: Yes     Drinks per session: 1 or 2     Binge frequency: Daily or almost daily     Comment: 6 drinks    Drug use: Not Currently    Sexual activity: Yes     Partners: Male     Birth control/protection: IUD   Social History Narrative    Works in [employer] History: She was adopted and does not know her family history.  Family  history is otherwise negative or as noted above.     REVIEW OF SYMPTOMS:  Constitutional: No fevers  Eye problems: No scleral icterus   Ear/Nose/Throat: No sore throat   Respiratory: No SOB  Heart: Denies chest pain  Kidney/Bladder: Denies dysuria   Neurological: Denies changes in strength or sensation   Skin: Denies jaundice  Musculoskeletal: Denies joint pain  The remainder of the review of systems were reviewed and were negative.    Physical Exam:  Vital Signs: BP 119/77 (BP Location: Left upper arm, Patient Position: Sitting, Cuff Size: Adult)   Pulse 66   Temp 36.4 C (97.6 F) (Oral)   Ht 165.1 cm (5' 5" )   Wt 59.9 kg (132 lb)   SpO2 98%   BMI 21.97 kg/m    Wt Readings from Last 3 Encounters:   04/09/18 59.9 kg (132 lb)   11/04/17 59.9 kg (132 lb)   09/02/17 60.8 kg (134 lb)     Constitutional: She is in no apparent distress.  Eyes: Anicteric  sclerae.  Ears, Nose, Mouth, Throat: Moist mucous membranes, no oral lesions.  Respiratory: Normal respiratory effort.   Cardiovascular: Regular rate and rhythm. No peripheral edema.  Gastrointestinal: Soft, nontender abdomen without hepatosplenomegaly, hernias or masses.  Musculoskeletal: No clubbing or cyanosis of hands. Normal range of motion in upper and lower extremities.  Skin: a few spider nevi on her skin No other cutaneous stigmata of chronic liver disease, no rashes.  Neurologic: No asterixis.  Psychiatric: Alert and oriented to person, place and time. Normal affect.    Records Review: I have personally reviewed records from PCP. These are summarized within the history of present illness.    Labs: I have personally reviewed and interpreted the following laboratory studies:  Component Date Value    Alanine transaminase 02/08/2018 55*    Aspartate transaminase 02/08/2018 41     HBV Real Time 02/08/2018 608*    HBV Log IU/mL 02/08/2018 2.78*    Creatinine 02/08/2018 0.80     Bilirubin, Total 02/08/2018 0.8     Int'l Normaliz Ratio 02/08/2018 1.1     Sodium, Serum / Plasma 02/08/2018 141    02/08/18 HBV DNA 608 IU/ml    11/26/2017: AST 54, ALT 82, HBV DNA 2,471,246 IU/mL    Results for Jillian Nixon, Jillian Nixon (MRN [id number]) as of 11/06/2017 09:53   Ref. Range 09/25/2017 09:12 10/26/2017 08:09   Sodium Latest Ref Range: 135 - 145 mmol/L 137    Potassium Latest Ref Range: 3.5 - 5.1 mmol/L 3.8    Chloride Latest Ref Range: 97 - 108 mmol/L 107    CO2 Latest Ref Range: 22 - 32 mmol/L 22    Urea Nitrogen, Serum / Plasma Latest Ref Range: 6 - 22 mg/dL 9    Creatinine Latest Ref Range: 0.44 - 1.00 mg/dL 0.74    Glucose, fasting Latest Ref Range: 70 - 99 mg/dL 104 (H)    Anion Gap Latest Ref Range: 4 - 14  8    eGFR if non-African American Latest Ref Range: >60 mL/min 101    eGFR if African Amer Latest Ref Range: >60 mL/min 117    Calcium Latest Ref Range: 8.8 - 10.3 mg/dL 8.8    Aspartate transaminase Latest Ref Range: 17  - 42 U/L 113 (H) 62 (H)   ALT Latest Ref Range: 11 - 50 U/L 156 (H) 95 (H)   Alkaline Phosphatase Latest Ref Range: 31 - 95 U/L 68 68   Bilirubin, Total Latest  Ref Range: 0.2 - 1.3 mg/dL 1.3    Bilirubin, Direct Latest Ref Range: <0.3 mg/dL 0.3 (H) 0.2   GGT Latest Ref Range: 7 - 37 U/L 71 (H) 55 (H)   Ceruloplasmin Latest Ref Range: 19.0 - 68.0 mg/dL 23.4    Cholesterol, Total Latest Ref Range: <200 mg/dL 163    Triglycerides, serum Latest Ref Range: <200 mg/dL 53    Cholesterol, HDL Latest Ref Range: >39 mg/dL 52    LDL Cholesterol Latest Ref Range: <130 mg/dL 100    Chol HDL Ratio Latest Ref Range: <6.0  3.1    Non HDL Cholesterol Latest Ref Range: <160 mg/dL 111    IgG, serum Latest Ref Range: 672 - 1,760 mg/dL 1,720 1,550   WBC Count Latest Ref Range: 3.4 - 10 x10E9/L  6.2   RBC Count Latest Ref Range: 4.0 - 5.2 x10E12/L  4.50   HEMOGLOBIN (HGB) Latest Ref Range: 12.0 - 15.5 g/dL  15.3   Hematocrit Latest Ref Range: 36 - 46 %  44.3   MCV Latest Ref Range: 80 - 100 fL  98   MCH Latest Ref Range: 26 - 34 pg  34.0   MCHC Latest Ref Range: 31 - 36 g/dL  34.5   Platelet Count Latest Ref Range: 140 - 450 x10E9/L  117 (L)   Neutrophil Absolute Count Latest Ref Range: 1.8 - 6.8 x10E9/L  3.71   Lymphocyte Abs Cnt Latest Ref Range: 1.0 - 3.4 x10E9/L  1.89   Monocyte Abs Count Latest Ref Range: 0.2 - 0.8 x10E9/L  0.40   Eosinophil Abs Ct Latest Ref Range: 0.0 - 0.4 x10E9/L  0.14   Basophil Abs Count Latest Ref Range: 0.0 - 0.1 x10E9/L  0.04   Imm Gran, Left Shift Latest Ref Range: <0.1 x10E9/L  0.02   PT Latest Ref Range: 11.8 - 14.8 s  14.8   Int'l Normaliz Ratio Latest Ref Range: 0.9 - 1.2   1.2   Alpha-1-Antitrypsin, serum Latest Ref Range: 79 - 207 mg/dL 127    ANA Pattern Latest Units: PATTERN Speckled    ANA Titer Latest Units: TITER 40    Titer 1 Latest Ref Range: <40  POS    Smooth Muscle Antibody Latest Ref Range: <20 UNITS 23 (H)    HBV Real Time Latest Ref Range: <10 IU/mL  5,809,983 (H)   HBV Log IU/mL Latest  Ref Range: <1.00   6.49 (H)   HBsAg Confirmation Unknown Positive for HBs ...    Hepatitis B Surface Antibody, Quantitative Latest Units: mIU/mL <5    Hepatitis B Core Antibody, IgM Latest Ref Range: NEG  POS (A)    Hepatitis B Core Antibody, Total Latest Ref Range: NEG  POS (A)    Hepatitis B e Antibody Latest Ref Range: Negative   Negative   Hepatitis B e Antigen Latest Ref Range: Negative   Positive   Hepatitis B Surface Antigen Latest Ref Range: NEG  Screen reactive. ... (A)    Hepatitis C Antibody Latest Ref Range: NEG  NEG    HIV Ag/Ab Combo Latest Ref Range: NEG   NEG   Hepatitis D Antibody Unknown  NEGATIVE   HBV BCP Mutations Unknown  DETECTED   HBV Genotype Unknown  C   HBV Polymerase Mutations Unknown  DETECTED   HBV Precore Mutations Unknown  NOT DETECTED           She had a total iron of 197 with 56% saturation and ferritin of  176.   She had a hereditary hemochromatosis evaluation that was negative for C282Y and H63D.     US Abdomen Complete (radiology Performed)    Result Date: 09/25/2017  1. Coarse hepatic parenchyma, a nonspecific finding that can be seen in a variety of diffuse liver diseases. Recommend correlation with serologies and relevant laboratory markers. No focal hepatic mass or contour nodularity. 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 She states ALT has been elevated for some time. We spent 20 minutes discussing the phases of HBV. She is not immune tolerant as she has elevated ALT. So she has immune active HBeAg positive disease. Sometimes patients with this phase can go on to become inactive and she would not need therapy.  - She should continue Entecavir 0.5 mg daily  - Repeat HBV DNA and  LFTs at least every 4-6 months    Possible cirrhosis: She has a few spider nevi and a coarse ultrasound with low platelets and fibroscan 21.2 (when ALT over 100).  This may be a false positive or truly cirrhosis. Will repeat fibroscan now that ALT improved.    Screening for Hettinger.  Guidelines recommend starting Thorp screening at age 41 for her but given concern for advanced fibrosis we will obtain repeat ultrasound and AFP before her next visit.    Health maintenance: she is negative for HCV , HDV and HIV.She has elevated IgG but it is coming down so likely reated to active HBV. She does not have wilson's, Celiac, hemochromatosis (negative C282Y/H63D . She has normal Fasting cholesterol panel,but mildly elevated fasting glucose     Assessment of liver fibrosis: The gold standard for assessing liver fibrosis is liver biopsy. However in 10-2012 the FDA approved Fibroscan to assess fibrosis in a non invasive manner.  Fibroscan has been shown to assess fibrosis in patients with various liver diseases and correlates well with liver biopsy.  It does not assess inflammation of the liver nor the cause of the liver fibrosis.   The first step in assessing fibrosis will be a Fibroscan.  If further assessment of cause of liver disease, site of fibrosis or amount of infammation is required then a liver biopsy will be needed.  Fibroscan should be repeated when ALT is <100 as high ALT will falsely elevate fibrosis score (ordered today).    Vaccination: I recommend vaccination for hepatitis A and B if not already immune.  - check Hep A IgG    Follow-up in 6 months    An After Visit Summary was printed and given to the patient.       Micheline Rough, am acting as a Education administrator for services provided by Cindra Eves, MD on 04/09/18 8:12 AM    The above scribed documentation as annotated by me accurately reflects the services I have provided.   Cindra Eves, MD  04/09/2018 8:51 AM

## 2018-04-28 ENCOUNTER — Telehealth: Payer: Self-pay | Admitting: Gastroenterology

## 2018-04-28 NOTE — Telephone Encounter (Signed)
Pt wants to cancel her procedure with [facility] on 11/5 because she is still waiting to get approved for [facility]. She will reschedule once she does.

## 2018-04-28 NOTE — Telephone Encounter (Signed)
Pt called hospital and cancelled procedure.

## 2018-04-29 NOTE — Telephone Encounter (Signed)
Noted  

## 2018-05-05 ENCOUNTER — Other Ambulatory Visit (HOSPITAL_COMMUNITY): Payer: Self-pay

## 2018-05-11 ENCOUNTER — Ambulatory Visit (HOSPITAL_COMMUNITY): Admit: 2018-05-11 | Payer: Medicaid Other | Admitting: Gastroenterology

## 2018-05-11 ENCOUNTER — Encounter (HOSPITAL_COMMUNITY): Payer: Self-pay

## 2018-05-11 SURGERY — COLONOSCOPY WITH PROPOFOL
Anesthesia: Monitor Anesthesia Care

## 2018-06-24 ENCOUNTER — Ambulatory Visit: Payer: Self-pay | Admitting: Nurse Practitioner

## 2018-07-28 DIAGNOSIS — B181 Chronic viral hepatitis B without delta-agent: Secondary | ICD-10-CM

## 2018-07-29 ENCOUNTER — Ambulatory Visit: Admit: 2018-07-29 | Discharge: 2018-07-29 | Payer: BLUE CROSS/BLUE SHIELD

## 2018-07-29 LAB — SODIUM, SERUM / PLASMA: Sodium, Serum / Plasma: 137 mmol/L (ref 135–145)

## 2018-07-29 LAB — CREATININE, SERUM / PLASMA
Creatinine: 0.78 mg/dL (ref 0.44–1.00)
eGFR - high estimate: 110 mL/min (ref >60)
eGFR - low estimate: 95 mL/min (ref >60)

## 2018-07-29 LAB — PROTHROMBIN TIME
Int'l Normaliz Ratio: 1.1 (ref 0.9–1.2)
PT: 13.7 s (ref 11.7–15.1)

## 2018-07-29 LAB — BILIRUBIN, TOTAL: Bilirubin, Total: 1.3 mg/dL (ref 0.2–1.3)

## 2018-07-29 LAB — ASPARTATE TRANSAMINASE: AST: 33 U/L (ref 17–42)

## 2018-07-29 LAB — ALBUMIN, SERUM / PLASMA: Albumin, Serum / Plasma: 4.5 g/dL (ref 3.5–4.8)

## 2018-07-29 LAB — ALANINE TRANSAMINASE: Alanine transaminase: 37 U/L (ref 11–50)

## 2018-07-30 LAB — HEPATITIS B DNA, QUANTITATIVE
Hep B DNA (IU/mL): 16 IU/mL — ABNORMAL HIGH (ref <10)
Hep B DNA (Log IU): 1.2 — ABNORMAL HIGH (ref <1.00)

## 2018-08-14 ENCOUNTER — Emergency Department (HOSPITAL_COMMUNITY)
Admission: EM | Admit: 2018-08-14 | Discharge: 2018-08-14 | Disposition: A | Discharge status: Home / Self Care | Payer: Medicaid Other | Attending: Emergency Medicine | Admitting: Emergency Medicine

## 2018-08-14 ENCOUNTER — Encounter (HOSPITAL_COMMUNITY): Payer: Self-pay

## 2018-08-14 DIAGNOSIS — Z79899 Other long term (current) drug therapy: Secondary | ICD-10-CM | POA: Insufficient documentation

## 2018-08-14 DIAGNOSIS — Z7982 Long term (current) use of aspirin: Secondary | ICD-10-CM | POA: Insufficient documentation

## 2018-08-14 DIAGNOSIS — F17210 Nicotine dependence, cigarettes, uncomplicated: Secondary | ICD-10-CM | POA: Insufficient documentation

## 2018-08-14 DIAGNOSIS — Z880 Allergy status to penicillin: Secondary | ICD-10-CM | POA: Insufficient documentation

## 2018-08-14 DIAGNOSIS — N764 Abscess of vulva: Secondary | ICD-10-CM | POA: Insufficient documentation

## 2018-08-14 HISTORY — DX: Bipolar disorder, unspecified: F31.9

## 2018-08-14 MED ORDER — SULFAMETHOXAZOLE-TRIMETHOPRIM 800-160 MG PO TABS: 1.0000 | ORAL_TABLET | Freq: Two times a day (BID) | ORAL | 0 refills | Status: AC

## 2018-08-14 MED ORDER — HYDROCODONE-ACETAMINOPHEN 5-325 MG PO TABS: 1.0000 | ORAL_TABLET | ORAL | 0 refills | Status: DC | PRN

## 2018-08-14 MED ORDER — HYDROCODONE-ACETAMINOPHEN 5-325 MG PO TABS
2.0000 | ORAL_TABLET | Freq: Once | ORAL | Status: AC
  Administered 2018-08-14: 2 via ORAL
  Filled 2018-08-14: qty 2

## 2018-08-14 MED ORDER — DOXYCYCLINE HYCLATE 100 MG PO TABS
100.0000 mg | ORAL_TABLET | Freq: Once | ORAL | Status: AC
  Administered 2018-08-14: 100 mg via ORAL
  Filled 2018-08-14: qty 1

## 2018-08-14 MED ORDER — PROMETHAZINE HCL 12.5 MG PO TABS
12.5000 mg | ORAL_TABLET | Freq: Once | ORAL | Status: AC
  Administered 2018-08-14: 12.5 mg via ORAL
  Filled 2018-08-14: qty 1

## 2018-08-14 MED ORDER — IBUPROFEN 800 MG PO TABS
800.0000 mg | ORAL_TABLET | Freq: Once | ORAL | Status: AC
  Administered 2018-08-14: 800 mg via ORAL
  Filled 2018-08-14: qty 1

## 2018-08-14 NOTE — ED Triage Notes (Signed)
Pt reports abscess to left side groin for 4 days. Pt attempted to drain.

## 2018-08-14 NOTE — ED Provider Notes (Signed)
[facility] EMERGENCY DEPARTMENT Provider Note   CSN: [id number] Arrival date & time: 08/14/18  1238     History   Chief Complaint Chief Complaint  Patient presents with  . Abscess    HPI Misty Young is a 43 y.o. female.  Pt attempted to drain the abscess with a diabetic lancet and needle.  The history is provided by the patient and the spouse.  Abscess  Location:  Pelvis Pelvic abscess location:  Vulva Abscess quality: induration, painful, redness and warmth   Duration:  4 days Progression:  Worsening Pain details:    Quality:  Pressure Chronicity:  New Context: not diabetes   Relieved by:  Nothing Worsened by:  Draining/squeezing Ineffective treatments:  Draining/squeezing Associated symptoms: no fever and no vomiting   Risk factors: prior abscess     Past Medical History:  Diagnosis Date  . Attention deficit disorder   . Bipolar 1 disorder (HCC)   . Hemorrhoids    uses Preparation H wipes and cream  . Kidney stones   . Opiate use     Patient Active Problem List   Diagnosis Date Noted  . Overdose 02/29/2016  . Acute encephalopathy 02/29/2016  . ADHD (attention deficit hyperactivity disorder) 02/29/2016  . Ambien accidental overdose 02/29/2016  . E. coli UTI 03/28/2015  . Generalized anxiety disorder 03/28/2015  . Pyelonephritis 03/27/2015  . Smoking trying to quit 03/27/2015  . Hypokalemia 03/27/2015  . Abnormal LFTs 06/12/2014  . Abnormal liver CT 06/12/2014  . Rectal bleeding 06/12/2014  . Abdominal swelling 06/12/2014  . Constipation 06/12/2014  . Hemorrhoids 06/12/2014  . Umbilical hernia 05/20/2012    Past Surgical History:  Procedure Laterality Date  . CESAREAN SECTION  2002/2004/2009/2013   x4  . INSERTION OF MESH  06/15/2012   Procedure: INSERTION OF MESH;  Surgeon: Ernestene Mention, MD;  Location: [facility];  Service: General;  Laterality: N/A;  . LITHOTRIPSY    . THERAPEUTIC ABORTION    . UMBILICAL HERNIA REPAIR  06/15/2012     Procedure: HERNIA REPAIR UMBILICAL ADULT;  Surgeon: Ernestene Mention, MD;  Location: [facility];  Service: General;  Laterality: N/A;     OB History    Gravida  6   Para  4   Term  4   Preterm      AB  2   Living  4     SAB  1   TAB  1   Ectopic      Multiple      Live Births  1            Home Medications    Prior to Admission medications   Medication Sig Start Date End Date Taking? Authorizing Provider  amphetamine-dextroamphetamine (ADDERALL) 30 MG tablet Take 30 mg by mouth 2 (two) times daily.    [provider]  aspirin EC 81 MG tablet Take 81 mg by mouth daily.    [provider]  diazepam (VALIUM) 10 MG tablet Take 10 mg by mouth 2 (two) times daily.    [provider]  hydrocortisone (ANUSOL-HC) 2.5 % rectal cream Place 1 application rectally 2 (two) times daily. For up to 10 days at a time. 03/24/18   Anice Paganini, NP  Multiple Vitamin (MULTIVITAMIN) tablet Take 1 tablet by mouth daily.    [provider]  omeprazole (PRILOSEC) 20 MG capsule Take 20 mg by mouth 2 (two) times daily before a meal.    [provider]  valACYclovir (VALTREX) 500 MG tablet Take 500 mg by mouth daily.    [provider]  VITAMIN E PO Take 1 tablet by mouth daily.    [provider]  zolpidem (AMBIEN) 10 MG tablet Take 10 mg by mouth at bedtime.    [provider]    Family History Family History  Problem Relation Age of Onset  . Diabetes Maternal Grandfather   . Colon cancer Neg Hx     Social History Social History   Tobacco Use  . Smoking status: Current Every Day Smoker    Packs/day: 0.50    Years: 15.00    Pack years: 7.50    Types: Cigarettes  . Smokeless tobacco: Never Used  Substance Use Topics  . Alcohol use: Not Currently    Alcohol/week: 0.0 standard drinks    Frequency: Never    Comment: Denies ETOH (03/24/18); previously: occasionally  . Drug use: Yes    Types: Marijuana     Comment: Last used opioids in 6-7 months (as of 03/24/18)     Allergies   Tramadol hcl and Penicillins   Review of Systems Review of Systems  Constitutional: Negative for activity change and fever.       All ROS Neg except as noted in HPI  HENT: Negative for nosebleeds.   Eyes: Negative for photophobia and discharge.  Respiratory: Negative for cough, shortness of breath and wheezing.   Cardiovascular: Negative for chest pain and palpitations.  Gastrointestinal: Negative for abdominal pain, blood in stool and vomiting.  Genitourinary: Negative for dysuria, frequency and hematuria.  Musculoskeletal: Negative for arthralgias, back pain and neck pain.  Skin:       abscess  Neurological: Negative for dizziness, seizures and speech difficulty.  Psychiatric/Behavioral: Negative for confusion and hallucinations.     Physical Exam Updated Vital Signs BP (!) 144/100 (BP Location: Right Arm) Comment: pt unable to relax arm d/t pain  Pulse (!) 111   Temp 98.3 F (36.8 C) (Temporal)   Resp 20   Ht 5\' 11"  (1.803 m)   Wt 113.4 kg   LMP 07/19/2018   SpO2 100%   BMI 34.87 kg/m   Physical Exam Vitals signs and nursing note reviewed. Exam conducted with a chaperone present (Pt's husband present during exam).  Constitutional:      Appearance: She is well-developed. She is not toxic-appearing.  HENT:     Head: Normocephalic.     Right Ear: Tympanic membrane and external ear normal.     Left Ear: Tympanic membrane and external ear normal.  Eyes:     General: Lids are normal.     Pupils: Pupils are equal, round, and reactive to light.  Neck:     Musculoskeletal: Normal range of motion and neck supple.     Vascular: No carotid bruit.  Cardiovascular:     Rate and Rhythm: Normal rate and regular rhythm.     Pulses: Normal pulses.     Heart sounds: Normal heart sounds.  Pulmonary:     Effort: No respiratory distress.     Breath sounds: Normal breath sounds.  Abdominal:     General:  Bowel sounds are normal.     Palpations: Abdomen is soft.     Tenderness: There is no abdominal tenderness. There is no guarding.  Genitourinary:    Exam position: Supine.     Labia:        Left: Tenderness present.        Comments:  Patient has an abscess area of the left labia.  The area is draining a purulent material on its own.  A culture was obtained and sent to the lab.  There is no penetration into the vaginal vault. Musculoskeletal: Normal range of motion.  Lymphadenopathy:     Head:     Right side of head: No submandibular adenopathy.     Left side of head: No submandibular adenopathy.     Cervical: No cervical adenopathy.  Skin:    General: Skin is warm and dry.  Neurological:     Mental Status: She is alert and oriented to person, place, and time.     Cranial Nerves: No cranial nerve deficit.     Sensory: No sensory deficit.  Psychiatric:        Speech: Speech normal.      ED Treatments / Results  Labs (all labs ordered are listed, but only abnormal results are displayed) Labs Reviewed - No data to display  EKG None  Radiology No results found.  Procedures Procedures (including critical care time)  Medications Ordered in ED Medications - No data to display   Initial Impression / Assessment and Plan / ED Course  I have reviewed the triage vital signs and the nursing notes.  Pertinent labs & imaging results that were available during my care of the patient were reviewed by me and considered in my medical decision making (see chart for details).       Final Clinical Impressions(s) / ED Diagnoses MDM  Patient has an abscess on the left labia.  It is been there for some time now, but got worse over the last 2 to 4 days.  The family made an attempt to lance it with a diabetic lancet.  At this point the problem got worse.  The area is draining on its own. NO I and D performed. A culture has been sent to the lab.  Patient will be placed on Bactrim and  Norco.  I have asked the patient to use Tylenol and ibuprofen for mild pain.  Have also asked her to use Epson salt soaks daily.  Patient is to return to the emergency department for additional assistance if not improving.   Final diagnoses:  Abscess of left genital labia    ED Discharge Orders         Ordered    sulfamethoxazole-trimethoprim (BACTRIM DS,SEPTRA DS) 800-160 MG tablet  2 times daily     08/14/18 1649    HYDROcodone-acetaminophen (NORCO/VICODIN) 5-325 MG tablet  Every 4 hours PRN     08/14/18 1649           Ivery Quale, PA-C 08/14/18 1707    Loren Racer, MD 08/15/18 1540

## 2018-08-14 NOTE — Discharge Instructions (Addendum)
Your blood pressure is elevated at 144/100.  Please have this rechecked soon.  The abscess of your left genital labia is draining on its own.  Please use warm Epson salt soaks daily for about 15 minutes.  Apply a pad or dressing to the area daily to prevent soiling of your clothing.  Use Bactrim 2 times daily with food.  Use 600 mg of ibuprofen and 500 mg of Tylenol with breakfast, lunch, dinner, and at bedtime.  Use Norco for more severe pain. This medication may cause drowsiness. Please do not drink, drive, or participate in activity that requires concentration while taking this medication.  See your primary physician or return to the emergency department if you develop high fever, have nausea vomiting related to this abscess area, have red streaks going up the labia and into the groin, signs of advancing infection, problems, or concerns.

## 2018-08-17 LAB — AEROBIC CULTURE W GRAM STAIN (SUPERFICIAL SPECIMEN): Culture: NORMAL

## 2018-08-18 ENCOUNTER — Telehealth: Payer: Self-pay | Admitting: *Deleted

## 2018-08-18 NOTE — Telephone Encounter (Signed)
Post ED Visit - Positive Culture Follow-up  Culture report reviewed by antimicrobial stewardship pharmacist:  []  Enzo Bi, Pharm.D. []  Celedonio Miyamoto, Pharm.D., BCPS AQ-ID []  Garvin Fila, Pharm.D., BCPS []  Georgina Pillion, Pharm.D., BCPS []  [place], [address].D., BCPS, AAHIVP []  Estella Husk, Pharm.D., BCPS, AAHIVP []  Lysle Pearl, PharmD, BCPS []  Phillips Climes, PharmD, BCPS [x]  Agapito Games, PharmD, BCPS []  Verlan Friends, PharmD  Positive wound culture Treated with Sulfamethoxazole-Trimethoprim, organism sensitive to the same and no further patient follow-up is required at this time.  Virl Axe [facility] 08/18/2018, 9:17 AM

## 2018-10-12 NOTE — Telephone Encounter (Signed)
Left vm    - f/u appt on 4/24 with Dr. Bufford Buttner has been switched to video visit    - fibroscan on 4/24 is canceled and rescheduled to 8/05

## 2018-10-13 ENCOUNTER — Other Ambulatory Visit: Payer: Self-pay | Admitting: *Deleted

## 2018-10-13 ENCOUNTER — Telehealth: Payer: Self-pay | Admitting: *Deleted

## 2018-10-13 ENCOUNTER — Other Ambulatory Visit: Payer: Self-pay

## 2018-10-13 ENCOUNTER — Encounter: Payer: Self-pay | Admitting: Nurse Practitioner

## 2018-10-13 ENCOUNTER — Ambulatory Visit (INDEPENDENT_AMBULATORY_CARE_PROVIDER_SITE_OTHER): Payer: Self-pay | Admitting: Nurse Practitioner

## 2018-10-13 DIAGNOSIS — K625 Hemorrhage of anus and rectum: Secondary | ICD-10-CM

## 2018-10-13 DIAGNOSIS — K649 Unspecified hemorrhoids: Secondary | ICD-10-CM

## 2018-10-13 DIAGNOSIS — K5900 Constipation, unspecified: Secondary | ICD-10-CM

## 2018-10-13 NOTE — Telephone Encounter (Signed)
Called patient and spoke with her and spouse. Procedure scheduled for 5/28 at 12:15pm. She stated she has her prep at home (suprep). I advised will mail instructions to her with her pre-op appointment. She voiced understanding.

## 2018-10-13 NOTE — Assessment & Plan Note (Signed)
Previous intermittent constipation.  This is much better now.  She takes daily stool softeners and uses milk of magnesia as needed for breakthrough symptoms.  She feels she is less constipated than she was previously.  She is also stopped her chronic pain medications which is likely helped.  Recommend she continue her current medications, call for any severe worsening of her symptoms.  Follow-up in 6 months.

## 2018-10-13 NOTE — Assessment & Plan Note (Signed)
The patient has a history of hemorrhoids, previously thrombosed hemorrhoid.  No symptoms of thrombosed hemorrhoid currently.  She is currently managing her hemorrhoids with medicated creams and wipes.  She does have Anusol on hand if needed.  Recommend she continue her creams and wipes that she has been using, use the Anusol as needed.  Follow-up in 6 months.  Call with any significant worsening.

## 2018-10-13 NOTE — Assessment & Plan Note (Signed)
Previously with rectal bleeding felt to be likely hemorrhoidal in nature/other benign anorectal source.  However, recommended a colonoscopy for further evaluation.  She had to cancel her previous colonoscopy attempt due to did not receive, financial assistance yet.  Today she states that she was told she needs to wait for an outstanding bill for short amount of time.  She still has the paperwork on file and I recommended she call to the hospital to discuss if she is eligible to submit paperwork.  Her bleeding is significantly better, only one episode since her last visit.  This is likely due to better control of her constipation, as per below.  At this point we will proceed with colonoscopy as previously recommended on propofol/MAC.  Scheduling date to be determined based on health system guidelines in the setting of COVID-19/coronavirus pandemic.  Follow-up in 6 months.  Proceed with TCS on propofol/MAC with Dr. Gala Romney in near future: the risks, benefits, and alternatives have been discussed with the patient in detail. The patient states understanding and desires to proceed.  The patient is currently on Xanax, Adderall, Phenergan, Ambien.  No other anticoagulants, anxiolytics, chronic pain medications, or antidepressants.  We will plan for the procedure on propofol/MAC to promote adequate sedation.

## 2018-10-13 NOTE — Patient Instructions (Signed)
Your health issues we discussed today were:   Constipation: 1. Continue taking stool softeners daily 2. You can use milk of magnesia as needed for breakthrough constipation 3. Call us if you have any significant worsening  Hemorrhoids: 1. Continue using your medicated creams and wipes 2. You should have Anusol prescription rectal cream at home that is available to use if needed if the creams and wipes do not help 3. As we discussed, if it feels like your hemorrhoid has become thrombosed you would need to proceed to the emergency department 4. Call us for any severe worsening  Rectal bleeding: 1. I feel your rectal bleeding is likely due to your hemorrhoids 2. However, we need to complete a colonoscopy to make sure there is not something else going on 3. We will schedule this soon as possible, but will likely need to wait until the coronavirus pandemic calms down 4. Our office will contact you for scheduling 5. Call the hospital and ask if you are eligible to submit [facility] financial assistance paperwork  Overall I recommend:  1. Follow-up in 6 months 2. Call us if you have any questions or concerns.    Because of recent events of COVID-19 ("Coronavirus"), follow CDC recommendations:  1. Wash your hand frequently 2. Avoid touching your face 3. Stay away from people who are sick 4. If you have symptoms such as fever, cough, shortness of breath then call your healthcare provider for further guidance 5. If you are sick, STAY AT HOME unless otherwise directed by your healthcare provider. 6. Follow directions from state and national officials regarding staying safe   At [facility] we value your feedback. You may receive a survey about your visit today. Please share your experience as we strive to create trusting relationships with our patients to provide genuine, compassionate, quality care.  We appreciate your understanding and patience as we review any laboratory  studies, imaging, and other diagnostic tests that are ordered as we care for you. Our office policy is 5 business days for review of these results, and any emergent or urgent results are addressed in a timely manner for your best interest. If you do not hear from our office in 1 week, please contact us.   We also encourage the use of MyChart, which contains your medical information for your review as well. If you are not enrolled in this feature, an access code is on this after visit summary for your convenience. Thank you for allowing [place] to be involved in your care.  It was great to see you today!  I hope you have a great day!!

## 2018-10-13 NOTE — Progress Notes (Signed)
Referring Provider: No ref. provider found Primary Care Physician:  Patient, No Pcp Per Primary GI:  Dr. Darrick PennaFields  NOTE: Service was provided via telemedicine and was requested by the patient due to COVID-19 pandemic.  Method of visit: Telephone  Patient Location: Home  Provider Location: Office  Reason for Phone Visit: Follow-up  The patient was consented to phone follow-up via telephone encounter including billing of the encounter (yes/no): yes  Persons present on the phone encounter, with roles: Husband  Total time (minutes) spent on medical discussion: 22 minutes  Chief Complaint  Patient presents with   Hemorrhoids    bleeding, itching   Rectal Bleeding    sometimes occurs without bm    HPI:   Misty Young is a 43 y.o. female who presents for virtual visit regarding: Follow-up on rectal bleeding.  The patient was last seen in our office 03/24/2018 for rectal bleeding and hemorrhoids.  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  Previously refused banding of hemorrhoids and wanting surgical resection.  Ultrasound elastography to was recommended to evaluate if shunting was due to cirrhosis and likely congenital versus congenital abnormality query need for vascular surgery discussion.  Ultrasound was never completed and she never followed up.  She canceled her previous recommendation for colonoscopy.  At her last visit she was noted to be sleepy because she had been up for "3 days with hemorrhoids."  She and her family looked at her hemorrhoids the previous weekend and compared to a video on YouTube and felt that they were thrombosed.  Previous history of thrombosed hemorrhoid which [facility].  She is using Preparation H without  relief.  Also using ice and sits baths to help.  Has not used the bathroom in 4 to 5 days despite "3 cups of milk of magnesia and Metamucil".  They have Amitiza and Linzess on hand but they are not wanting to mix that with her over-the-counter medications.  Does still take oxycodone for pain intermittently.  Not on Suboxone anymore.  No other GI complaints.  Recommended completing [facility] financial assistance paperwork, proceed with colonoscopy, Anusol sent to the pharmacy, continue sits baths and ice packs, if she feels she develops a thrombosed hemorrhoid proceed to the emergency department, follow-up in 3 months.  The patient was scheduled for colonoscopy 05/11/2018 but canceled because she was [facility].  She stated she would call and reschedule when it is approved.  Today she states she applied for [facility] Financial Assistance but was told she needs an outstanding bill that is 58 months old to be able to apply. She now has a bill outstanding from the ER. Previously had rectal bleeding, sometimes without a bowel movement. Not constipated now that she's on stool softeners. Has not had rectal bleeding since her last OV other than one episode 3 days ago. Has a bowel movement about every 2-3 days, stools are sometimes hard, but not as bad as previously, occasional sensation of incomplete emptying, but typically does empty completely. Using stool softeners daily and MoM for constipation despite stool softeners. If she has hemorrhoid symptoms uses medicated wipes which has helped. Has Anusol at home if needed. Denies melena, abdominal pain, N/V, fever, chills, unintentional weight loss. Her previously repaired umbilical hernia  has returned, generally soft and non-tender. Denies URI and flu-like symptoms. Denies chest pain, dyspnea, dizziness, lightheadedness, syncope, near syncope. Denies any other upper or lower GI symptoms.  Past Medical History:  Diagnosis Date   Attention  deficit disorder    Bipolar 1 disorder (HCC)    Hemorrhoids    uses Preparation H wipes and cream   Kidney stones    Opiate use     Past Surgical History:  Procedure Laterality Date   CESAREAN SECTION  2002/2004/2009/2013   x4   INSERTION OF MESH  06/15/2012   Procedure: INSERTION OF MESH;  Surgeon: Ernestene Mention, MD;  Location: [facility];  Service: General;  Laterality: N/A;   LITHOTRIPSY     THERAPEUTIC ABORTION     UMBILICAL HERNIA REPAIR  06/15/2012   Procedure: HERNIA REPAIR UMBILICAL ADULT;  Surgeon: Ernestene Mention, MD;  Location: [facility];  Service: General;  Laterality: N/A;    Current Outpatient Medications  Medication Sig Dispense Refill   ALPRAZolam (XANAX) 0.5 MG tablet Take 1 tablet by mouth 4 (four) times daily as needed.     amphetamine-dextroamphetamine (ADDERALL) 30 MG tablet Take 30 mg by mouth 3 (three) times daily.      aspirin EC 81 MG tablet Take 81 mg by mouth daily.     hydrocortisone (ANUSOL-HC) 2.5 % rectal cream Place 1 application rectally 2 (two) times daily. For up to 10 days at a time. (Patient taking differently: Place 1 application rectally as needed. For up to 10 days at a time.) 30 g 1   Multiple Vitamin (MULTIVITAMIN) tablet Take 1 tablet by mouth daily.     omeprazole (PRILOSEC) 20 MG capsule Take 20 mg by mouth 2 (two) times daily before a meal.     promethazine (PHENERGAN) 25 MG tablet Take 1 tablet by mouth as needed.     valACYclovir (VALTREX) 500 MG tablet Take 500 mg by mouth daily.     zolpidem (AMBIEN) 10 MG tablet Take 10 mg by mouth at bedtime.     No current facility-administered medications for this visit.     Allergies as of 10/13/2018 - Review Complete 10/13/2018  Allergen Reaction Noted   Tramadol hcl Other (See Comments) 03/28/2015   Penicillins Hives 01/27/2011    Family History  Problem Relation Age of Onset   Diabetes Maternal Grandfather    Colon cancer Neg Hx     Social History    Socioeconomic History   Marital status: Married    Spouse name: Not on file   Number of children: 4   Years of education: Not on file   Highest education level: Not on file  Occupational History   Occupation: unemployed  Ecologist strain: Not on file   Food insecurity:    Worry: Not on file    Inability: Not on file   Transportation needs:    Medical: Not on file    Non-medical: Not on file  Tobacco Use   Smoking status: Current Every Day Smoker    Packs/day: 0.50    Years: 15.00    Pack years: 7.50    Types: Cigarettes   Smokeless tobacco: Never Used  Substance and Sexual Activity   Alcohol use: Not Currently    Alcohol/week: 0.0 standard drinks    Frequency: Never    Comment: Denies ETOH (10/13/18); previously: occasionally   Drug use: Yes    Types: Marijuana    Comment: occ marijuana  Sexual activity: Not Currently    Birth control/protection: Surgical  Lifestyle   Physical activity:    Days per week: Not on file    Minutes per session: Not on file   Stress: Not on file  Relationships   Social connections:    Talks on phone: Not on file    Gets together: Not on file    Attends religious service: Not on file    Active member of club or organization: Not on file    Attends meetings of clubs or organizations: Not on file    Relationship status: Not on file  Other Topics Concern   Not on file  Social History Narrative   Not on file    Review of Systems: Complete ROS negative except as per HPI.  Physical Exam: Note: limited exam due to virtual visit General:   Alert and oriented. Pleasant and cooperative.  Ears:  Normal auditory acuity. Skin:  Intact without facial significant lesions or rashes. Neurologic:  Alert and oriented x4 Psych:  Alert and cooperative. Normal mood and affect.

## 2018-10-14 ENCOUNTER — Encounter: Payer: Self-pay | Admitting: Gastroenterology

## 2018-10-14 NOTE — Progress Notes (Signed)
No pcp per patient 

## 2018-11-26 ENCOUNTER — Ambulatory Visit: Admit: 2018-11-26 | Discharge: 2018-11-26 | Payer: BLUE CROSS/BLUE SHIELD

## 2018-11-26 ENCOUNTER — Encounter (HOSPITAL_COMMUNITY)
Admission: RE | Admit: 2018-11-26 | Discharge: 2018-11-26 | Disposition: A | Discharge status: Home / Self Care | Payer: Medicaid Other | Source: Ambulatory Visit | Attending: Internal Medicine | Admitting: Internal Medicine

## 2018-11-26 ENCOUNTER — Other Ambulatory Visit: Payer: Self-pay

## 2018-11-26 DIAGNOSIS — B181 Chronic viral hepatitis B without delta-agent: Secondary | ICD-10-CM

## 2018-11-27 LAB — CREATININE, SERUM / PLASMA
Creatinine: 0.81 mg/dL (ref 0.55–1.02)
eGFR - high estimate: 105 mL/min (ref >60)
eGFR - low estimate: 90 mL/min (ref >60)

## 2018-11-27 LAB — BILIRUBIN, TOTAL: Bilirubin, Total: 0.5 mg/dL (ref 0.2–1.2)

## 2018-11-27 LAB — SODIUM, SERUM / PLASMA: Sodium, Serum / Plasma: 138 mmol/L (ref 135–145)

## 2018-11-27 LAB — ALBUMIN, SERUM / PLASMA: Albumin, Serum / Plasma: 4 g/dL (ref 3.5–5.0)

## 2018-11-27 LAB — PROTHROMBIN TIME
Int'l Normaliz Ratio: 1.1 (ref 0.9–1.2)
PT: 13.6 s (ref 11.7–15.1)

## 2018-11-30 MED ORDER — ENTECAVIR 0.5 MG TABLET
0.5 | ORAL | 3 refills | Status: AC
Start: 2018-11-30 — End: 2019-12-05

## 2018-12-01 ENCOUNTER — Other Ambulatory Visit: Payer: Self-pay

## 2018-12-01 ENCOUNTER — Telehealth: Payer: Self-pay

## 2018-12-01 ENCOUNTER — Other Ambulatory Visit (HOSPITAL_COMMUNITY)
Admission: RE | Admit: 2018-12-01 | Discharge: 2018-12-01 | Disposition: A | Discharge status: Home / Self Care | Payer: Medicaid Other | Source: Ambulatory Visit | Attending: Internal Medicine | Admitting: Internal Medicine

## 2018-12-01 NOTE — Telephone Encounter (Signed)
Spoke to pt's spouse she has a fever and has vomited. TCS w/Propofol w/RMR cancelled. Will call pt later to reschedule. LMOVM for endo scheduler.

## 2018-12-01 NOTE — Telephone Encounter (Signed)
Received call from AP. Pt is running a fever and sick. Pt wont be able to make her procedure with RMR 12/02/18 and needs to r/s at some point,

## 2018-12-02 ENCOUNTER — Ambulatory Visit (HOSPITAL_COMMUNITY): Admission: RE | Admit: 2018-12-02 | Payer: Medicaid Other | Source: Home / Self Care | Admitting: Internal Medicine

## 2018-12-02 ENCOUNTER — Encounter (HOSPITAL_COMMUNITY): Admission: RE | Payer: Self-pay | Source: Home / Self Care

## 2018-12-02 SURGERY — COLONOSCOPY WITH PROPOFOL
Anesthesia: Monitor Anesthesia Care

## 2018-12-08 ENCOUNTER — Other Ambulatory Visit: Payer: Self-pay

## 2018-12-08 ENCOUNTER — Telehealth: Payer: Self-pay

## 2018-12-08 DIAGNOSIS — K625 Hemorrhage of anus and rectum: Secondary | ICD-10-CM

## 2018-12-08 DIAGNOSIS — K649 Unspecified hemorrhoids: Secondary | ICD-10-CM

## 2018-12-08 NOTE — Telephone Encounter (Signed)
See phone note for 12/08/18.

## 2018-12-08 NOTE — Telephone Encounter (Signed)
Per EG, TCS w/Propofol should be w/SLF.  Called pt's husband, TCS w/Propofol w/SLF rescheduled to 03/15/19 at 12:00pm. Pt already has prep. Orders entered.

## 2018-12-09 NOTE — Telephone Encounter (Signed)
Pre-op appt 03/11/19 at 1:45pm. Letter mailed with procedure instructions.

## 2018-12-17 ENCOUNTER — Ambulatory Visit: Admit: 2018-12-17 | Discharge: 2018-12-18 | Payer: BLUE CROSS/BLUE SHIELD | Attending: Diagnostic Radiology

## 2018-12-17 DIAGNOSIS — B181 Chronic viral hepatitis B without delta-agent: Secondary | ICD-10-CM

## 2018-12-21 ENCOUNTER — Telehealth: Payer: Self-pay | Admitting: *Deleted

## 2018-12-21 NOTE — Telephone Encounter (Signed)
Called spouse and patient is on AB schedule for tomorrow at [place].

## 2018-12-21 NOTE — Telephone Encounter (Signed)
She needs an in office appt this week. There should be openings with one of [place]. Continue the cream twice a day. Avoid straining. If large amounts of bleeding, associated with dizziness, light-headedness, weakness, seek medical attention.

## 2018-12-21 NOTE — Telephone Encounter (Signed)
Patient spouse Laverna Peace called. Patient is having rectal bleeding from her hemorrhoids. Believes she may have a possible tear back there. When she wipes she has right much blood on the paper and she will have some in the toilet as well. She is using the rectal cream for hemorrhoids. Patient scheduled for TCS 03/15/2019. I offered appointment with AB tomorrow for eval but states they have to take her father in law for a procedure in [place] tomorrow morning. Spouse requesting further recs. EG out of the office. Please advise AB thanks

## 2018-12-22 ENCOUNTER — Ambulatory Visit: Payer: Medicaid Other | Admitting: Gastroenterology

## 2018-12-22 ENCOUNTER — Encounter: Payer: Self-pay | Admitting: Gastroenterology

## 2018-12-22 ENCOUNTER — Telehealth: Payer: Self-pay | Admitting: Gastroenterology

## 2018-12-22 NOTE — Telephone Encounter (Signed)
PATIENT WAS A NO SHOW AND LETTER SENT  °

## 2018-12-24 ENCOUNTER — Ambulatory Visit: Admit: 2018-12-24 | Discharge: 2018-12-24 | Payer: BLUE CROSS/BLUE SHIELD

## 2018-12-24 DIAGNOSIS — D696 Thrombocytopenia, unspecified: Secondary | ICD-10-CM

## 2018-12-24 DIAGNOSIS — K7400 Hepatic fibrosis, unspecified: Secondary | ICD-10-CM

## 2018-12-24 DIAGNOSIS — B181 Chronic viral hepatitis B without delta-agent: Secondary | ICD-10-CM

## 2018-12-24 DIAGNOSIS — R7309 Other abnormal glucose: Secondary | ICD-10-CM

## 2018-12-24 NOTE — Progress Notes (Signed)
Jillian Nixon is a 43 y.o. female seen in follow up consultation at the request of Dr. Maudie Mercury for abnormal liver function tests found to be Hep B sAg positive with elevated fibroscan.    I performed this consultation using real-time Telehealth tools, including a live video connection between my location and the patient's location. Prior to initiating the consultation, I obtained informed verbal consent to perform this consultation using Telehealth tools and answered all the questions about the Telehealth interaction.     History of Present Illness:  Jillian Nixon is a 43 y.o. female who is here for evaluation of abnormal liver function tests. The patient has no known significant health issues. In the fall/winter of 2018 she developed nausea and abdominal pain. This was in the setting of moving back to [place] after living in [place] for a few months. Her primary care physician ordered labs including liver function tests. Her AST/ALT were elevated to 100 and 131. Repeat liver function tests showed continued elevation in this range. She also had a mildly elevated fasting glucose of 105. Her bilirubin and alk phos were normal.  She had a total iron drawn of 197 with % saturation of 56 and ferritin of 176. Testing showed that she was positive for HBsAg with HBV DNA >2,000,000 IU/ml in May 2019 for which she was started on Entecavir.  On 02/08/18, her AST was 41 and ALT 55 with HBV DNA down to 608 IU/ml.  She had a hereditary hemochromatosis evaluation that was negative for C282Y and H63D mutations. Labs on 07/28/18 showed HBV DNA 16 IU/mL, AST 33, and ALT 37.    The patient is adopted and does not know her family history. She was born in [place] and came to the [place] when she was 68 months old.    The patient currently reports that she feels well. Her abdominal pain resolved after moving back to [facility] and with eating a regular diet. She denies jaundice, scleral icterus, ascites, swelling, nausea,  vomiting, hematochezia, melena, confusion. She drinks alcohol on occasion, but not more than 3-4 drinks in one sitting and usually on a couple nights a week. She drinks herbal tea bought in a tea shop, but denies other herbal supplements. She denies frequent tylenol use. She denies recent weight gain or loss. She denies fevers or chills.     Past Medical History:   Diagnosis Date    Hyperhidrosis     Insomnia      Past Surgical History:   Procedure Laterality Date    REMOVAL BREAST IMPLANT       Medication Sig    entecavir (BARACLUDE) 0.5 mg tablet TAKE 1 TABLET DAILY    ergocalciferol, vitamin D2, 2,000 unit TAB Take 2,000 Units by mouth Daily. Start daily treatment after finishing once a week Vit D (50,000)    glycopyrrolate (ROBINUL) 2 mg tablet Take 2 mg by mouth 2 (two) times daily.    melatonin-gaba-valerian 01-03-49 mg TAB Take by mouth.    zolpidem (AMBIEN CR) 12.5 mg ER tablet Take 12.5 mg by mouth nightly as needed for Sleep.     Allergies: She has No Known Allergies.    Social History:  Socioeconomic History    Marital status: Unknown/Declined   Tobacco Use    Smoking status Never Smoker    Smokeless tobacco: Never Used   Substance and Sexual Activity    Alcohol use: Yes     Drinks per session: 1 or 2  Binge frequency: Daily or almost daily     Comment: 6 drinks    Drug use: Not Currently    Sexual activity: Yes     Partners: Male     Birth control/protection: IUD   Social History Narrative    Works in [place] History: She was adopted and does not know her family history.    REVIEW OF SYMPTOMS:  Constitutional: No fevers  Eye problems: No scleral icterus   Ear/Nose/Throat: No sore throat   Respiratory: No SOB  Heart: Denies chest pain  Kidney/Bladder: Denies dysuria   Neurological: Denies changes in strength or sensation   Skin: Denies jaundice  Musculoskeletal: Denies joint pain  The remainder of the review of systems were reviewed and were negative.    Physical Exam:  Weight  today 133 pounds  Wt Readings from Last 3 Encounters:   04/09/18 59.9 kg (132 lb)   11/04/17 59.9 kg (132 lb)   09/02/17 60.8 kg (134 lb)     Constitutional: Patient appears well-developed and well-nourished. Pleasant and appropriately interactive.  Head: Normocephalic and atraumatic.   Eyes: Anicteric sclera  Neck: Normal range of motion.  Pulmonary/Chest: Effort normal. No respiratory distress. No cough.  Abdomen: No abdominal distension.  Neurological: Alert and oriented to person, place, and time. Able to stand from sitting and walk. No asterixis.  Psychiatric: Normal mood and affect. Behavior is normal. Judgment and thought content normal.  Skin: No rash.    Labs: I have personally reviewed and interpreted the following laboratory studies:  Component Date Value    Sodium, Serum / Plasma 11/26/2018 138     Int'l Normaliz Ratio 11/26/2018 1.1     Bilirubin, Total 11/26/2018 0.5     Creatinine 11/26/2018 0.81      07/28/18: Na 137, Cr 0.78, Albumin 4.5, ALT 37, AST 33, Tbili 1.3, INR 1.1, HBV 15 IU/mL    Component Date Value    Alanine transaminase 02/08/2018 55*    Aspartate transaminase 02/08/2018 41     HBV Real Time 02/08/2018 608*    HBV Log IU/mL 02/08/2018 2.78*    Creatinine 02/08/2018 0.80     Bilirubin, Total 02/08/2018 0.8     Int'l Normaliz Ratio 02/08/2018 1.1     Sodium, Serum / Plasma 02/08/2018 141    02/08/18 HBV DNA 608 IU/ml    11/26/2017: AST 54, ALT 82, HBV DNA 7,829,562 IU/mL     09/25/2017 09:12 10/26/2017 08:09   Sodium 137    Potassium 3.8    Chloride 107    CO2 22    Urea Nitrogen, Serum / Plasma 9    Creatinine 0.74    Glucose, fasting 104 (H)    Anion Gap 8    eGFR if non-African American 101    eGFR if African Amer 117    Calcium 8.8    Aspartate transaminase 113 (H) 62 (H)   ALT 156 (H) 95 (H)   Alkaline Phosphatase 68 68   Bilirubin, Total 1.3    Bilirubin, Direct 0.3 (H) 0.2   GGT 71 (H) 55 (H)   Ceruloplasmin 23.4    Cholesterol, Total 163    Triglycerides, serum 53     Cholesterol, HDL 52    LDL Cholesterol 100    Chol HDL Ratio 3.1    Non HDL Cholesterol 111    IgG, serum 1,720 1,550   WBC Count  6.2   RBC Count  4.50  HEMOGLOBIN (HGB)  15.3   Hematocrit  44.3   MCV  98   MCH  34.0   MCHC  34.5   Platelet Count  117 (L)   Neutrophil Absolute Count  3.71   Lymphocyte Abs Cnt  1.89   Monocyte Abs Count  0.40   Eosinophil Abs Ct  0.14   Basophil Abs Count  0.04   Imm Gran, Left Shift  0.02   PT  14.8   Int'l Normaliz Ratio  1.2   Alpha-1-Antitrypsin, serum 127    ANA Pattern Speckled    ANA Titer 40    Titer 1 POS    Smooth Muscle Antibody 23 (H)    HBV Real Time  4,709,628 (H)   HBV Log IU/mL  6.49 (H)   HBsAg Confirmation Positive for HBs ...    Hepatitis B Surface Antibody, Quantitative <5    Hepatitis B Core Antibody, IgM POS (A)    Hepatitis B Core Antibody, Total POS (A)    Hepatitis B e Antibody  Negative   Hepatitis B e Antigen  Positive   Hepatitis B Surface Antigen Screen reactive. ... (A)    Hepatitis C Antibody NEG    HIV Ag/Ab Combo  NEG   Hepatitis D Antibody  NEGATIVE   HBV BCP Mutations  DETECTED   HBV Genotype  C   HBV Polymerase Mutations  DETECTED   HBV Precore Mutations  NOT DETECTED           She had a total iron of 197 with 56% saturation and ferritin of 176.     She had a hereditary hemochromatosis evaluation that was negative for C282Y and H63D.     Studies: I have personally reviewed and interpreted the following images-  US liver 12/17/18 showed VIS-A Adequate exam. Reason for limitation:  N/A. US-1 Negative.  Normal appearing liver.    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 She states ALT has been elevated for some time.  HBV DNA and ALT have come down as expected on antiviral therapy. She should continue Entecavir 0.5 mg daily and repeat HBV DNA and LFTs at least every 6 months (ordered today).     Possible cirrhosis: She has a few spider nevi and a coarse ultrasound with low platelets and fibroscan 21.2 (when ALT over 100).  This may be a false positive as her recent ultrasound showed a normal appearing liver. Will repeat fibroscan at next clinic visit now that ALT improved.    Screening for [place].  Guidelines recommend starting [place] screening at age 52 for her but if ongoing concern for advanced fibrosis or cirrhosis based on fibroscan, we would plan to obtain ultrasounds every 6 months.    Health maintenance: she is negative for HCV , HDV and HIV. She has elevated IgG but it is coming down so likely reated to active HBV. She does not have wilson's, Celiac, hemochromatosis (negative C282Y/H63D). She has normal fasting cholesterol panel but mildly elevated fasting glucose     Vaccination: I recommend vaccination for hepatitis A and B if not already immune.  - check Hep A IgG    Follow-up in 6 months    An After Visit Summary was printed and given to the patient.     I, Charlsie Quest, am acting as  a scribe for services provided by Cindra Eves, MD on 12/24/18 6:47 AM    The above scribed documentation as annotated by me accurately reflects the services I have provided.   Cindra Eves, MD  12/24/2018 9:05 AM

## 2018-12-27 ENCOUNTER — Ambulatory Visit: Payer: Medicaid Other | Admitting: Nurse Practitioner

## 2018-12-27 ENCOUNTER — Telehealth: Payer: Self-pay | Admitting: Gastroenterology

## 2018-12-27 NOTE — Telephone Encounter (Signed)
You should d/c procedure as well.

## 2018-12-27 NOTE — Telephone Encounter (Signed)
Dr fields I just noticed the patient is scheduled for a procedure on 9/8.  I think her procedure was rescheduled due to COVID-19.  Do you want to still discharge her or wait to see if she no shows for her preadmit appointment?

## 2018-12-27 NOTE — Telephone Encounter (Signed)
Routing to Dr. Oneida Alar for order to discharge patient from the practice

## 2018-12-27 NOTE — Telephone Encounter (Signed)
Noted discharge letter will be mailed

## 2018-12-27 NOTE — Telephone Encounter (Signed)
REVIEWED. PT > 3 NO SHOW IN ONE CALENDAR YEAR. OK TO D/C FROM PRACTICE.

## 2018-12-27 NOTE — Telephone Encounter (Signed)
This was patient's 4th no show.  Has received policy letter prior to this.  Last 2 appointment's were scheduled the day prior, or a few days,prior because she called to be seen and sill was a no show.

## 2018-12-27 NOTE — Telephone Encounter (Signed)
I called Ladona Horns and made her aware we needed to cancel the patients upcoming procedure.

## 2018-12-28 ENCOUNTER — Encounter: Payer: Self-pay | Admitting: General Practice

## 2018-12-28 NOTE — Telephone Encounter (Signed)
Discharge letter mailed  

## 2019-03-11 ENCOUNTER — Other Ambulatory Visit (HOSPITAL_COMMUNITY): Payer: Medicaid Other

## 2019-03-15 ENCOUNTER — Ambulatory Visit (HOSPITAL_COMMUNITY): Admit: 2019-03-15 | Payer: Medicaid Other | Admitting: Gastroenterology

## 2019-03-15 ENCOUNTER — Encounter (HOSPITAL_COMMUNITY): Payer: Self-pay

## 2019-03-15 SURGERY — COLONOSCOPY WITH PROPOFOL
Anesthesia: Monitor Anesthesia Care

## 2019-04-21 ENCOUNTER — Ambulatory Visit: Payer: Self-pay | Admitting: Nurse Practitioner

## 2019-05-06 ENCOUNTER — Ambulatory Visit: Admit: 2019-05-06 | Discharge: 2019-05-06 | Payer: BLUE CROSS/BLUE SHIELD

## 2019-05-06 DIAGNOSIS — R7309 Other abnormal glucose: Secondary | ICD-10-CM

## 2019-05-06 DIAGNOSIS — D696 Thrombocytopenia, unspecified: Secondary | ICD-10-CM

## 2019-05-06 DIAGNOSIS — B181 Chronic viral hepatitis B without delta-agent: Secondary | ICD-10-CM

## 2019-05-06 DIAGNOSIS — K7400 Hepatic fibrosis, unspecified: Secondary | ICD-10-CM

## 2019-05-06 LAB — ALKALINE PHOSPHATASE: Alkaline Phosphatase: 58 U/L (ref 38–108)

## 2019-05-06 LAB — BILIRUBIN, TOTAL: Bilirubin, Total: 0.9 mg/dL (ref 0.2–1.2)

## 2019-05-06 LAB — ALANINE TRANSAMINASE: Alanine transaminase: 30 U/L (ref 10–61)

## 2019-05-06 LAB — ASPARTATE TRANSAMINASE: AST: 27 U/L (ref 5–44)

## 2019-05-11 LAB — HEPATITIS B DNA, QUANTITATIVE
Hep B DNA (IU/mL): 12 IU/mL — ABNORMAL HIGH (ref <10)
Hep B DNA (Log IU): 1.08 — ABNORMAL HIGH (ref <1.00)

## 2019-05-11 LAB — HEPATITIS B E ANTIBODY: Hep B e Ab: NEGATIVE

## 2019-05-11 LAB — HEPATITIS B E ANTIGEN: Hep B e Ag: POSITIVE

## 2019-06-24 ENCOUNTER — Ambulatory Visit: Admit: 2019-06-24 | Discharge: 2019-06-24 | Payer: BLUE CROSS/BLUE SHIELD

## 2019-06-24 DIAGNOSIS — K7400 Hepatic fibrosis, unspecified: Secondary | ICD-10-CM

## 2019-06-24 DIAGNOSIS — R7309 Other abnormal glucose: Secondary | ICD-10-CM

## 2019-06-24 DIAGNOSIS — B181 Chronic viral hepatitis B without delta-agent: Secondary | ICD-10-CM

## 2019-06-24 DIAGNOSIS — R932 Abnormal findings on diagnostic imaging of liver and biliary tract: Secondary | ICD-10-CM

## 2019-06-24 DIAGNOSIS — D696 Thrombocytopenia, unspecified: Secondary | ICD-10-CM

## 2019-06-24 NOTE — Patient Instructions (Addendum)
-   You should have labs every 4-6 months (ordered today for [facility] [address])  - You need an ultrasound around every year   - You will need a fibroscan, we can do this locally or at [facility].  - Follow up in 6 months.     If you have any questions please contact our clinic.   Non transplant hepatology:   [address], [address], [place] [address]   Phone [phone]   Fax [phone]

## 2019-06-24 NOTE — Addendum Note (Signed)
Addended by: Samule Ohm on: 10/08/4617 01:22 AM     Modules accepted: Orders

## 2019-06-24 NOTE — Progress Notes (Signed)
Jillian Nixon is a 43 y.o. female seen in follow up consultation for abnormal liver function tests found to be Hep B sAg positive with elevated fibroscan.    I performed this consultation using real-time Telehealth tools, including a live video connection between my location and the patient's location. Prior to initiating the consultation, I obtained informed verbal consent to perform this consultation using Telehealth tools and answered all the questions about the Telehealth interaction.     History of Present Illness:  Jillian Nixon is a 43 y.o. female who is here for evaluation of abnormal liver function tests. The patient has no known significant health issues. In the fall/winter of 2018 she developed nausea and abdominal pain. This was in the setting of moving back to [place] after living in [place] for a few months. Her primary care physician ordered labs including liver function tests. Her AST/ALT were elevated to 100 and 131. Repeat liver function tests showed continued elevation in this range. She also had a mildly elevated fasting glucose of 105. Her bilirubin and alk phos were normal.  She had a total iron drawn of 197 with % saturation of 56 and ferritin of 176. Testing showed that she was positive for HBsAg with HBV DNA >2,000,000 IU/ml in May 2019 for which she was started on Entecavir.  On 02/08/18, her AST was 41 and ALT 55 with HBV DNA down to 608 IU/ml.  She had a hereditary hemochromatosis evaluation that was negative for C282Y and H63D mutations. Labs on 07/28/18 showed HBV DNA 16 IU/mL, AST 33, and ALT 37.    The patient is adopted and does not know her family history. She was born in [place] and came to the [place] when she was 67 months old.     The patient currently reports that she feels well. Her abdominal pain resolved after moving back to [facility] and with eating a regular diet. She denies jaundice, scleral icterus, ascites, swelling, nausea, vomiting, hematochezia, melena, confusion. She drinks alcohol on occasion, but not more than 3-4 drinks in one sitting and usually on a couple nights a week. She drinks herbal tea bought in a tea shop, but denies other herbal supplements. She denies frequent tylenol use. She denies recent weight gain or loss. She denies fevers or chills.     Past Medical History:   Diagnosis Date    Hyperhidrosis     Insomnia      Past Surgical History:   Procedure Laterality Date    REMOVAL BREAST IMPLANT       Medication Sig    entecavir (BARACLUDE) 0.5 mg tablet TAKE 1 TABLET DAILY    ergocalciferol, vitamin D2, 2,000 unit TAB Take 2,000 Units by mouth Daily. Start daily treatment after finishing once a week Vit D (50,000)    glycopyrrolate (ROBINUL) 2 mg tablet Take 2 mg by mouth 2 (two) times daily.    melatonin-gaba-valerian 01-03-49 mg TAB Take by mouth.    zolpidem (AMBIEN CR) 12.5 mg ER tablet Take 12.5 mg by mouth nightly as needed for Sleep.     Allergies: She has No Known Allergies.    Social History:  Socioeconomic History    Marital status: Unknown/Declined   Tobacco Use    Smoking status Never Smoker    Smokeless tobacco: Never Used   Substance and Sexual Activity    Alcohol use: Yes     Drinks per session: 1 or 2     Binge frequency: Daily or almost  daily     Comment: 6 drinks    Drug use: Not Currently    Sexual activity: Yes     Partners: Male     Birth control/protection: IUD   Social History Narrative    Works in [place] History: She was adopted and does not know her family history.    REVIEW OF SYMPTOMS:  Constitutional: No fevers  Eye problems: No scleral icterus   Ear/Nose/Throat: No sore throat   Respiratory: No SOB  Heart: Denies chest pain  Kidney/Bladder: Denies dysuria    Neurological: Denies changes in strength or sensation   Skin: Denies jaundice  Musculoskeletal: Denies joint pain  The remainder of the review of systems were reviewed and were negative.    Physical Exam:  Weight today 133 pounds  Wt Readings from Last 3 Encounters:   04/09/18 59.9 kg (132 lb)   11/04/17 59.9 kg (132 lb)   09/02/17 60.8 kg (134 lb)     Constitutional: Patient appears well-developed and well-nourished. Pleasant and appropriately interactive.  Head: Normocephalic and atraumatic.   Eyes: Anicteric sclera  Neck: Normal range of motion.  Pulmonary/Chest: Effort normal. No respiratory distress. No cough.  Abdomen: No abdominal distension.  Neurological: Alert and oriented to person, place, and time.   Psychiatric: Normal mood and affect. Behavior is normal. Judgment and thought content normal.  Skin: No rash.    Labs: I have personally reviewed and interpreted the following laboratory studies:  Component Date Value    Hepatitis B e Antibody 05/06/2019 Negative     Hepatitis B e Antigen 05/06/2019 Positive     Bilirubin, Total 05/06/2019 0.9     Alkaline Phosphatase 05/06/2019 58     Aspartate transaminase 05/06/2019 27     Alanine transaminase 05/06/2019 30     HBV Real Time 05/06/2019 12*    HBV Log IU/mL 05/06/2019 1.08*     Component Date Value    Sodium, Serum / Plasma 11/26/2018 138     Int'l Normaliz Ratio 11/26/2018 1.1     Bilirubin, Total 11/26/2018 0.5     Creatinine 11/26/2018 0.81      07/28/18: Na 137, Cr 0.78, Albumin 4.5, ALT 37, AST 33, Tbili 1.3, INR 1.1, HBV 15 IU/mL    Component Date Value    Alanine transaminase 02/08/2018 55*    Aspartate transaminase 02/08/2018 41     HBV Real Time 02/08/2018 608*    HBV Log IU/mL 02/08/2018 2.78*    Creatinine 02/08/2018 0.80     Bilirubin, Total 02/08/2018 0.8     Int'l Normaliz Ratio 02/08/2018 1.1     Sodium, Serum / Plasma 02/08/2018 141      11/26/2017: AST 54, ALT 82, HBV DNA 8,315,176 IU/mL      09/25/2017 09:12 10/26/2017 08:09   Sodium 137    Potassium 3.8    Chloride 107    CO2 22    Urea Nitrogen, Serum / Plasma 9    Creatinine 0.74    Glucose, fasting 104 (H)    Anion Gap 8    eGFR if non-African American 101    eGFR if African Amer 117    Calcium 8.8    Aspartate transaminase 113 (H) 62 (H)   ALT 156 (H) 95 (H)   Alkaline Phosphatase 68 68   Bilirubin, Total 1.3    Bilirubin, Direct 0.3 (H) 0.2   GGT 71 (H) 55 (H)   Ceruloplasmin 23.4  Cholesterol, Total 163    Triglycerides, serum 53    Cholesterol, HDL 52    LDL Cholesterol 100    Chol HDL Ratio 3.1    Non HDL Cholesterol 111    IgG, serum 1,720 1,550   WBC Count  6.2   RBC Count  4.50   HEMOGLOBIN (HGB)  15.3   Hematocrit  44.3   MCV  98   MCH  34.0   MCHC  34.5   Platelet Count  117 (L)   Neutrophil Absolute Count  3.71   Lymphocyte Abs Cnt  1.89   Monocyte Abs Count  0.40   Eosinophil Abs Ct  0.14   Basophil Abs Count  0.04   Imm Gran, Left Shift  0.02   PT  14.8   Int'l Normaliz Ratio  1.2   Alpha-1-Antitrypsin, serum 127    ANA Pattern Speckled    ANA Titer 40    Titer 1 POS    Smooth Muscle Antibody 23 (H)    HBV Real Time  8,469,629 (H)   HBV Log IU/mL  6.49 (H)   HBsAg Confirmation Positive for HBs ...    Hepatitis B Surface Antibody, Quantitative <5    Hepatitis B Core Antibody, IgM POS (A)    Hepatitis B Core Antibody, Total POS (A)    Hepatitis B e Antibody  Negative   Hepatitis B e Antigen  Positive   Hepatitis B Surface Antigen Screen reactive. ... (A)    Hepatitis C Antibody NEG    HIV Ag/Ab Combo  NEG   Hepatitis D Antibody  NEGATIVE   HBV BCP Mutations  DETECTED   HBV Genotype  C   HBV Polymerase Mutations  DETECTED   HBV Precore Mutations  NOT DETECTED           She had a total iron of 197 with 56% saturation and ferritin of 176.     She had a hereditary hemochromatosis evaluation that was negative for C282Y and H63D.     Studies: I have personally reviewed and interpreted the following images-   US liver 12/17/18 showed VIS-A Adequate exam. Reason for limitation:  N/A. US-1 Negative.  Normal appearing liver.    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 She states ALT has been elevated for some time.  HBV DNA and ALT have come down as expected on antiviral therapy. She should continue Entecavir 0.5 mg daily and repeat HBV DNA and LFTs at least every 6 months (ordered today).    Possible cirrhosis: She has a few spider nevi and a coarse ultrasound with low platelets and fibroscan 21.2 (when ALT over 100).  This may be a false positive as her recent ultrasound showed a normal appearing liver. Will repeat fibroscan at next clinic visit now that ALT improved.    Screening for [place].  Guidelines recommend starting [place] screening at age 18 for her but if ongoing concern for advanced fibrosis or cirrhosis based on fibroscan, we would plan to obtain ultrasounds every 6-12 months.    Health maintenance: she is negative for HCV , HDV and HIV. She has elevated IgG but it is coming down so likely reated to active HBV. She does not have wilson's, Celiac, hemochromatosis (negative C282Y/H63D). She has normal fasting cholesterol panel but mildly elevated  fasting glucose     Vaccination: I recommend vaccination for hepatitis A and B if not already immune.  - check Hep A IgG    Follow-up in 6-8 months    An After Visit Summary was printed and given to the patient.     I, Barrie Folk am acting as a Education administrator for the services provided by Cindra Eves, MD 06/24/19 7:15 AM.    The above scribed documentation as annotated by me accurately reflects the services I have provided.   Cindra Eves, MD  06/24/2019 8:55 AM

## 2019-12-06 MED ORDER — ENTECAVIR 0.5 MG TABLET
0.5 | ORAL_TABLET | Freq: Every day | ORAL | 3 refills | Status: AC
Start: 2019-12-06 — End: 2020-11-29

## 2019-12-16 LAB — ALANINE TRANSAMINASE: Alanine transaminase: 19 IU/L (ref 0–32)

## 2019-12-16 LAB — HEPATITIS B E ANTIGEN: Hep B e Ag: POSITIVE — AB

## 2019-12-16 LAB — ASPARTATE TRANSAMINASE: AST: 23 IU/L (ref 0–40)

## 2019-12-16 LAB — HEPATITIS B DNA, QUANTITATIVE: Hep B DNA (IU/mL): NOT DETECTED IU/mL

## 2019-12-16 LAB — HEPATITIS B E ANTIBODY: Hep B e Ab: POSITIVE — AB

## 2020-01-06 ENCOUNTER — Ambulatory Visit: Admit: 2020-01-06 | Discharge: 2020-01-06 | Payer: BLUE CROSS/BLUE SHIELD

## 2020-01-06 DIAGNOSIS — D696 Thrombocytopenia, unspecified: Secondary | ICD-10-CM

## 2020-01-06 DIAGNOSIS — B181 Chronic viral hepatitis B without delta-agent: Secondary | ICD-10-CM

## 2020-01-06 DIAGNOSIS — K7400 Hepatic fibrosis, unspecified: Secondary | ICD-10-CM

## 2020-01-06 NOTE — Progress Notes (Signed)
Jillian Nixon is a 44 y.o. female seen in follow up consultation for abnormal liver function tests found to have chronic hepatitis B.     I performed this consultation using real-time Telehealth tools, including a live video connection between my location and the patient's location. Prior to initiating the consultation, I obtained informed verbal consent to perform this consultation using Telehealth tools and answered all the questions about the Telehealth interaction.     History of Present Illness:  Jillian Nixon is a 44 y.o. female who is here for evaluation of abnormal liver function tests. The patient has no known significant health issues. In the fall/winter of 2018 she developed nausea and abdominal pain. This was in the setting of moving back to [place] after living in [place] for a few months. Her primary care physician ordered labs including liver function tests. Her AST/ALT were elevated to 100 and 131. Repeat liver function tests showed continued elevation in this range. She also had a mildly elevated fasting glucose of 105. Her bilirubin and alk phos were normal.  She had a total iron drawn of 197 with % saturation of 56 and ferritin of 176. Testing showed that she was positive for HBsAg with HBV DNA >2,000,000 IU/ml in May 2019 for which she was started on Entecavir.  On 02/08/18, her AST was 41 and ALT 55 with HBV DNA down to 608 IU/ml.  She had a hereditary hemochromatosis evaluation that was negative for C282Y and H63D mutations. Labs on 07/28/18 showed HBV DNA 16 IU/mL, AST 33, and ALT 37.     The patient is adopted and does not know her family history. She was born in [place] and came to the [place] when she was 79 months old.     The patient currently reports that she feels well. Her abdominal pain resolved after moving back to [facility] and with eating a regular diet. She denies jaundice, scleral icterus, ascites, swelling, nausea, vomiting, hematochezia, melena, confusion. She  drinks alcohol on occasion, but not more than 3-4 drinks in one sitting and usually on a couple nights a week. She drinks herbal tea bought in a tea shop, but denies other herbal supplements. She denies frequent tylenol use. She denies recent weight gain or loss. She denies fevers or chills.      Past Medical History:   Diagnosis Date    Hyperhidrosis     Insomnia      Past Surgical History:   Procedure Laterality Date    REMOVAL BREAST IMPLANT       Current Outpatient Medications   Medication Sig Dispense Refill    entecavir (BARACLUDE) 0.5 mg tablet Take 1 tablet (0.5 mg total) by mouth daily TAKE 1 TABLET DAILY 90 tablet 3    ergocalciferol, vitamin D2, 2,000 unit TAB Take 2,000 Units by mouth Daily. Start daily treatment after finishing once a week Vit D (50,000) 60 tablet 1    glycopyrrolate (ROBINUL) 2 mg tablet Take 2 mg by mouth 2 (two) times daily.      melatonin-gaba-valerian 01-03-49 mg TAB Take by mouth.      zolpidem (AMBIEN CR) 12.5 mg ER tablet Take 12.5 mg by mouth nightly as needed for Sleep.       No current facility-administered medications for this visit.     Social History     Socioeconomic History    Marital status: Unknown/Declined     Spouse name: Not on file    Number of children: Not  on file    Years of education: Not on file    Highest education level: Not on file   Occupational History    Not on file   Tobacco Use    Smoking status: Never Smoker    Smokeless tobacco: Never Used   Substance and Sexual Activity    Alcohol use: Yes     Comment: 6 drinks    Drug use: Not Currently    Sexual activity: Yes     Partners: Male     Birth control/protection: I.U.D.   Other Topics Concern    Not on file   Social History Narrative    Works in [place] History: She was adopted and does not know her family history.     REVIEW OF SYMPTOMS:  Constitutional: No fevers  Eye problems: No scleral icterus   Ear/Nose/Throat: No sore throat   Respiratory: No SOB  Heart: Denies chest  pain  Kidney/Bladder: Denies dysuria   Neurological: Denies changes in strength or sensation   Skin: Denies jaundice  Musculoskeletal: Denies joint pain  The remainder of the review of systems were reviewed and were negative.     Physical Exam:  Weight today 133 pounds  Wt Readings from Last 3 Encounters:   04/09/18 59.9 kg (132 lb)   11/04/17 59.9 kg (132 lb)   09/02/17 60.8 kg (134 lb)      Constitutional: Patient appears well-developed and well-nourished. Pleasant and appropriately interactive.  Head: Normocephalic and atraumatic.   Eyes: Anicteric sclera  Neck: Normal range of motion.  Pulmonary/Chest: Effort normal. No respiratory distress. No cough.  Abdomen: No abdominal distension.  Neurological: Alert and oriented to person, place, and time.   Psychiatric: Normal mood and affect. Behavior is normal. Judgment and thought content normal.  Skin: No rash.     Labs: I have personally reviewed and interpreted the following laboratory studies:   12/12/19- HBeAg +, HbeAB +, HBV DNA undetectable, ALT 19, AST 23    Component Date Value    Hepatitis B e Antibody 05/06/2019 Negative     Hepatitis B e Antigen 05/06/2019 Positive     Bilirubin, Total 05/06/2019 0.9     Alkaline Phosphatase 05/06/2019 58     Aspartate transaminase 05/06/2019 27     Alanine transaminase 05/06/2019 30     HBV Real Time 05/06/2019 12*    HBV Log IU/mL 05/06/2019 1.08*      Component Date Value    Sodium, Serum / Plasma 11/26/2018 138     Int'l Normaliz Ratio 11/26/2018 1.1     Bilirubin, Total 11/26/2018 0.5     Creatinine 11/26/2018 0.81       07/28/18: Na 137, Cr 0.78, Albumin 4.5, ALT 37, AST 33, Tbili 1.3, INR 1.1, HBV 15 IU/mL     11/26/2017: AST 54, ALT 82, HBV DNA 7,124,580 IU/mL       09/25/2017 09:12 10/26/2017 08:09   Sodium 137     Potassium 3.8     Chloride 107     CO2 22     Urea Nitrogen, Serum / Plasma 9     Creatinine 0.74     Glucose, fasting 104 (H)     Anion Gap 8     eGFR if non-African American 101     eGFR if  African Amer 117     Calcium 8.8     Aspartate transaminase 113 (H) 62 (H)   ALT 156 (H) 95 (  H)   Alkaline Phosphatase 68 68   Bilirubin, Total 1.3     Bilirubin, Direct 0.3 (H) 0.2   GGT 71 (H) 55 (H)   Ceruloplasmin 23.4     Cholesterol, Total 163     Triglycerides, serum 53     Cholesterol, HDL 52     LDL Cholesterol 100     Chol HDL Ratio 3.1     Non HDL Cholesterol 111     IgG, serum 1,720 1,550   WBC Count   6.2   RBC Count   4.50   HEMOGLOBIN (HGB)   15.3   Hematocrit   44.3   MCV   98   MCH   34.0   MCHC   34.5   Platelet Count   117 (L)   Neutrophil Absolute Count   3.71   Lymphocyte Abs Cnt   1.89   Monocyte Abs Count   0.40   Eosinophil Abs Ct   0.14   Basophil Abs Count   0.04   Imm Gran, Left Shift   0.02   PT   14.8   Int'l Normaliz Ratio   1.2   Alpha-1-Antitrypsin, serum 127     ANA Pattern Speckled     ANA Titer 40     Titer 1 POS     Smooth Muscle Antibody 23 (H)     HBV Real Time   3,557,322 (H)   HBV Log IU/mL   6.49 (H)   HBsAg Confirmation Positive for HBs ...     Hepatitis B Surface Antibody, Quantitative <5     Hepatitis B Core Antibody, IgM POS (A)     Hepatitis B Core Antibody, Total POS (A)     Hepatitis B e Antibody   Negative   Hepatitis B e Antigen   Positive   Hepatitis B Surface Antigen Screen reactive. ... (A)     Hepatitis C Antibody NEG     HIV Ag/Ab Combo   NEG   Hepatitis D Antibody   NEGATIVE   HBV BCP Mutations   DETECTED   HBV Genotype   C   HBV Polymerase Mutations   DETECTED   HBV Precore Mutations   NOT DETECTED      She had a total iron of 197 with 56% saturation and ferritin of 176.      She had a hereditary hemochromatosis evaluation that was negative for C282Y and H63D.      Studies: I have personally reviewed and interpreted the following images-  US liver 12/17/18 showed VIS-A Adequate exam. Reason for limitation:  N/A. US-1 Negative.  Normal appearing liver.     Fibroscan 10-07-2017  21.2 Kpa with CAP 215     Assessment  ASSESSMENT AND PLAN:  Immune active HBeAg  positive Chronic hepatitis B: She is positive for HBsAg and is HBeAg and HBeAb positive with previously high but now undetectable HBV DNA and normal AST and ALT after antiviral therapy. She is currently e antibody positive but remains E antigen positive. This may be a sign that her e antigen could clear in the next 1-2 years. If this were the case then we would treat for another year and she could stop treatment. Until then, she should continue Entecavir 0.5 mg daily and repeat HBV DNA and LFTs at least every 6 months (ordered today).      Possible cirrhosis: She has a few spider nevi and a coarse ultrasound with low platelets and fibroscan 21.2 (  when ALT over 100).  This may be a false positive as her recent ultrasound showed a normal appearing liver.  We will repeat fibroscan when she comes back to [facility] later this year     Screening for [facility].  Guidelines recommend starting [place] screening at age 62 for her but if ongoing concern for advanced fibrosis or cirrhosis based on fibroscan, we would plan to obtain ultrasounds every 6-12 months.     Health maintenance: she is negative for HCV , HDV and HIV. She has elevated IgG but it is coming down so likely reated to active HBV. She does not have Wilson's, Celiac, hemochromatosis (negative C282Y/H63D). She has normal fasting cholesterol panel but mildly elevated fasting glucose      Follow-up in 12 months    I spent a total of 45 minutes on this patient's care on the day of their visit excluding time spent related to any billed procedures. This time includes time spent with the patient as well as time spent documenting in the medical record, reviewing patient's records and tests, obtaining history, placing orders, communicating with other healthcare professionals, counseling the patient, family, or caregiver, and/or care coordination for the diagnoses above.

## 2020-01-06 NOTE — Patient Instructions (Addendum)
Continue Entecavir 0.5mg  indefinitely for now. When you come back to [facility] send our office a message on MyChart to make an appointment to have a Fibroscan. This is not urgent, but should be done in the next year if possible.     Please get your labs to monitor your hepatitis B every 6 months.     Follow-up with [place] in 1 year.

## 2020-05-13 ENCOUNTER — Encounter (HOSPITAL_COMMUNITY): Payer: Self-pay | Admitting: *Deleted

## 2020-05-13 ENCOUNTER — Other Ambulatory Visit: Payer: Self-pay

## 2020-05-13 ENCOUNTER — Emergency Department (HOSPITAL_COMMUNITY)
Admission: EM | Admit: 2020-05-13 | Discharge: 2020-05-13 | Disposition: A | Discharge status: Home / Self Care | Payer: Medicare Other | Attending: Emergency Medicine | Admitting: Emergency Medicine

## 2020-05-13 DIAGNOSIS — F17210 Nicotine dependence, cigarettes, uncomplicated: Secondary | ICD-10-CM | POA: Diagnosis not present

## 2020-05-13 DIAGNOSIS — L02214 Cutaneous abscess of groin: Secondary | ICD-10-CM | POA: Insufficient documentation

## 2020-05-13 DIAGNOSIS — L0291 Cutaneous abscess, unspecified: Secondary | ICD-10-CM

## 2020-05-13 MED ORDER — BUPIVACAINE-EPINEPHRINE (PF) 0.25% -1:200000 IJ SOLN
30.0000 mL | Freq: Once | INTRAMUSCULAR | Status: AC
  Administered 2020-05-13: 30 mL
  Filled 2020-05-13: qty 30

## 2020-05-13 MED ORDER — NAPROXEN 375 MG PO TABS: 375.0000 mg | ORAL_TABLET | Freq: Two times a day (BID) | ORAL | 0 refills | Status: DC

## 2020-05-13 MED ORDER — ONDANSETRON 4 MG PO TBDP
4.0000 mg | ORAL_TABLET | Freq: Once | ORAL | Status: AC
  Administered 2020-05-13: 4 mg via ORAL
  Filled 2020-05-13: qty 1

## 2020-05-13 MED ORDER — DOXYCYCLINE HYCLATE 100 MG PO CAPS: 100.0000 mg | ORAL_CAPSULE | Freq: Two times a day (BID) | ORAL | 0 refills | Status: DC

## 2020-05-13 MED ORDER — OXYCODONE-ACETAMINOPHEN 5-325 MG PO TABS
2.0000 | ORAL_TABLET | Freq: Once | ORAL | Status: AC
  Administered 2020-05-13: 2 via ORAL
  Filled 2020-05-13: qty 2

## 2020-05-13 NOTE — ED Triage Notes (Signed)
Abscess right groin area x 2 days 

## 2020-05-13 NOTE — ED Provider Notes (Signed)
[facility] EMERGENCY DEPARTMENT Provider Note   CSN: [id number] Arrival date & time: 05/13/20  1739     History Chief Complaint  Patient presents with  . Abscess  . Groin Pain    Misty BanChristina L Young is a 44 y.o. female.  The history is provided by the patient.  Abscess Location:  Pelvis Pelvic abscess location:  Groin Size:  3 Abscess quality: fluctuance, induration, painful, redness and warmth   Red streaking: no   Duration:  2 days Progression:  Worsening Pain details:    Quality:  Aching   Severity:  Severe   Duration:  2 days   Timing:  Constant   Progression:  Worsening Chronicity:  Recurrent Context: not diabetes   Relieved by:  Nothing Worsened by:  Nothing Ineffective treatments:  None tried Associated symptoms: no anorexia, no fatigue, no fever, no headaches, no nausea and no vomiting   Risk factors: no family hx of MRSA, no hx of MRSA and no prior abscess   Groin Pain Pertinent negatives include no headaches.       Past Medical History:  Diagnosis Date  . Attention deficit disorder   . Bipolar 1 disorder (HCC)   . Hemorrhoids    uses Preparation H wipes and cream  . Kidney stones   . Opiate use     Patient Active Problem List   Diagnosis Date Noted  . Overdose 02/29/2016  . Acute encephalopathy 02/29/2016  . ADHD (attention deficit hyperactivity disorder) 02/29/2016  . Ambien accidental overdose 02/29/2016  . E. coli UTI 03/28/2015  . Generalized anxiety disorder 03/28/2015  . Pyelonephritis 03/27/2015  . Smoking trying to quit 03/27/2015  . Hypokalemia 03/27/2015  . Abnormal LFTs 06/12/2014  . Abnormal liver CT 06/12/2014  . Rectal bleeding 06/12/2014  . Abdominal swelling 06/12/2014  . Constipation 06/12/2014  . Hemorrhoids 06/12/2014  . Umbilical hernia 05/20/2012    Past Surgical History:  Procedure Laterality Date  . CESAREAN SECTION  2002/2004/2009/2013   x4  . INSERTION OF MESH  06/15/2012   Procedure: INSERTION OF  MESH;  Surgeon: Ernestene MentionHaywood M Ingram, MD;  Location: [facility] ORS;  Service: General;  Laterality: N/A;  . LITHOTRIPSY    . THERAPEUTIC ABORTION    . UMBILICAL HERNIA REPAIR  06/15/2012   Procedure: HERNIA REPAIR UMBILICAL ADULT;  Surgeon: Ernestene MentionHaywood M Ingram, MD;  Location: [facility] ORS;  Service: General;  Laterality: N/A;     OB History    Gravida  6   Para  4   Term  4   Preterm      AB  2   Living  4     SAB  1   TAB  1   Ectopic      Multiple      Live Births  1           Family History  Problem Relation Age of Onset  . Diabetes Maternal Grandfather   . Colon cancer Neg Hx     Social History   Tobacco Use  . Smoking status: Current Every Day Smoker    Packs/day: 0.50    Years: 15.00    Pack years: 7.50    Types: Cigarettes  . Smokeless tobacco: Never Used  Substance Use Topics  . Alcohol use: Not Currently    Alcohol/week: 0.0 standard drinks    Comment: Denies ETOH (10/13/18); previously: occasionally  . 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 Authorizing Provider  ALPRAZolam Prudy Feeler) 1 MG tablet Take 1 mg by mouth 4 (four) times daily.    [provider]  amphetamine-dextroamphetamine (ADDERALL) 30 MG tablet Take 30 mg by mouth 3 (three) times daily.     [provider]  aspirin EC 81 MG tablet Take 81 mg by mouth daily.    [provider]  Aspirin-Acetaminophen-Caffeine (GOODY HEADACHE PO) Take 1 packet by mouth daily as needed (headaches).    [provider]  Cariprazine HCl (VRAYLAR) 6 MG CAPS Take 6 mg by mouth daily.    [provider]  diphenhydrAMINE (BENADRYL) 25 MG tablet Take 50 mg by mouth daily as needed for allergies.    [provider]  ibuprofen (ADVIL) 200 MG tablet Take 800 mg by mouth every 6 (six) hours as needed for headache or moderate pain.    [provider]  lamoTRIgine (LAMICTAL) 100 MG tablet Take 25-100 mg by mouth See admin instructions. Take 25 mg at bedtime for 2 weeks, then 50 mg at bedtime for 2 weeks, then 100 mg at bedtime    [provider]  Lidocaine-Glycerin (PREPARATION H EX) Apply 1 application topically daily as needed (hemorrhoids).    [provider]  Melatonin 10 MG CAPS Take 10 mg by mouth at bedtime as needed (sleep).    [provider]  Multiple Vitamin (MULTIVITAMIN) tablet Take 1 tablet by mouth daily.    [provider]  naproxen sodium (ALEVE) 220 MG tablet Take 440 mg by mouth daily as needed (stomach pain).    [provider]  omeprazole (PRILOSEC) 20 MG capsule Take 20 mg by mouth See admin instructions. Take 20 mg by mouth daily, may take a second 20 mg dose as needed for heartburn    [provider]  promethazine (PHENERGAN) 25 MG tablet Take 25 mg by mouth every 8 (eight) hours as needed for nausea or vomiting.  10/01/18   [provider]  valACYclovir (VALTREX) 500 MG tablet Take 500 mg by mouth daily.    [provider]  witch hazel-glycerin (TUCKS) pad Apply 1 application topically as needed for hemorrhoids.    [provider]  zolpidem (AMBIEN) 10 MG tablet Take 15 mg by mouth at bedtime.     [provider]    Allergies    Tramadol hcl and Penicillins  Review of Systems   Review of Systems  Constitutional: Negative for fatigue and fever.  Gastrointestinal: Negative for anorexia, nausea and vomiting.  Neurological: Negative for headaches.    Physical Exam Updated Vital Signs BP 117/73   Pulse (!) 115   Temp 98.1 F (36.7 C)   Resp 20   LMP 04/30/2020   SpO2 97%   Physical Exam Vitals and nursing note reviewed.  Constitutional:      General: She is not in acute distress.    Appearance: She is well-developed. She is not diaphoretic.  HENT:     Head: Normocephalic and atraumatic.  Eyes:      General: No scleral icterus.    Conjunctiva/sclera: Conjunctivae normal.  Cardiovascular:     Rate and Rhythm: Normal rate and regular rhythm.     Heart sounds: Normal heart sounds. No murmur heard.  No friction rub. No gallop.   Pulmonary:     Effort: Pulmonary effort is normal. No respiratory distress.     Breath sounds: Normal  breath sounds.  Abdominal:     General: Bowel sounds are normal. There is no distension.     Palpations: Abdomen is soft. There is no mass.     Tenderness: There is no abdominal tenderness. There is no guarding.  Musculoskeletal:     Cervical back: Normal range of motion.  Skin:    General: Skin is warm and dry.     Findings: Abscess present.     Comments: 2 cm fluctuant area of the medial right thigh/groin area there is about 10 cm of surrounding erythema and induration.  Tender to palpation.  No lymphangitis.  Palpitations currently vomiting probably status post on home thank you think you have to do it troponin happy to help but and I can sign out however she did not take signout no dysphasia retired had a blood pressure of 87-level 2 least  Neurological:     Mental Status: She is alert and oriented to person, place, and time.  Psychiatric:        Behavior: Behavior normal.     ED Results / Procedures / Treatments   Labs (all labs ordered are listed, but only abnormal results are displayed) Labs Reviewed - No data to display  EKG None  Radiology No results found.  Procedures .Marland KitchenIncision and Drainage  Date/Time: 05/13/2020 8:41 PM Performed by: Arthor Captain, PA-C Authorized by: Arthor Captain, PA-C   Consent:    Consent obtained:  Verbal   Consent given by:  Patient   Risks discussed:  Bleeding, incomplete drainage, pain and damage to other organs   Alternatives discussed:  No treatment Universal protocol:    Procedure explained and questions answered to patient or proxy's satisfaction: yes     Relevant documents present and verified:  yes     Test results available and properly labeled: yes     Imaging studies available: yes     Required blood products, implants, devices, and special equipment available: yes     Site/side marked: yes     Immediately prior to procedure a time out was called: yes     Patient identity confirmed:  Verbally with patient Location:    Type:  Abscess Pre-procedure details:    Skin preparation:  Betadine Anesthesia (see MAR for exact dosages):    Anesthesia method:  Local infiltration   Local anesthetic:  Bupivacaine 0.25% WITH epi Procedure type:    Complexity:  Complex Procedure details:    Incision types:  Single straight   Scalpel blade:  11   Wound management:  Probed and deloculated, irrigated with saline and extensive cleaning   Drainage:  Purulent   Drainage amount:  Moderate Post-procedure details:    Patient tolerance of procedure:  Tolerated well, no immediate complications   (including critical care time)  Medications Ordered in ED Medications  bupivacaine-epinephrine (MARCAINE W/ EPI) 0.25% -1:200000 injection 30 mL (has no administration in time range)    ED Course  I have reviewed the triage vital signs and the nursing notes.  Pertinent labs & imaging results that were available during my care of the patient were reviewed by me and considered in my medical decision making (see chart for details).    MDM Rules/Calculators/A&P                          Misty Young presents with abscess. There is no area of retained pus after procedure. The presentation of Misty Young is NOT consistent  with necrotizing fascitis or osteomyolitis. There is no evidence of retained foreign body, neurovascular or tendon injury. The presentation of Misty Young is NOT consistent with sepsis and/or bacteremia. Misty Young meets outpatient criteria for treatment with doxycycline and is sent home on empiric antibiotics covering the relevant bacteria.  Strict  return and follow-up precautions have been given by me personally or by detailed written instructions verbalized by nursing staff using the teach back method to the patient/family/caregiver(s).  Data Reviewed/Counseling: I have reviewed the patient's vital signs, nursing notes, and other relevant tests/information. I had a detailed discussion regarding the historical points, exam findings, and any diagnostic results supporting the discharge diagnosis. I also discussed the need for outpatient follow-up and the need to return to the ED if symptoms worsen or if there are any questions or concerns that arise at home.  Final Clinical Impression(s) / ED Diagnoses Final diagnoses:  None    Rx / DC Orders ED Discharge Orders    None       Arthor Captain, PA-C 05/13/20 2044    Bethann Berkshire, MD 05/15/20 1554

## 2020-05-13 NOTE — Discharge Instructions (Addendum)
Contact a health care provider if you have:  More redness, swelling, or pain around your abscess.  More fluid or blood coming from your abscess.  Warm skin around your abscess.  More pus or a bad smell coming from your abscess.  A fever.  Muscle aches.  Chills or a general ill feeling.  Get help right away if you:  Have severe pain.  See red streaks on your skin spreading away from the abscess.

## 2020-07-25 DIAGNOSIS — Z79899 Other long term (current) drug therapy: Secondary | ICD-10-CM | POA: Diagnosis not present

## 2020-07-25 DIAGNOSIS — R4020 Unspecified coma: Secondary | ICD-10-CM | POA: Diagnosis not present

## 2020-07-25 DIAGNOSIS — T50904A Poisoning by unspecified drugs, medicaments and biological substances, undetermined, initial encounter: Secondary | ICD-10-CM | POA: Diagnosis not present

## 2020-07-25 DIAGNOSIS — T401X1A Poisoning by heroin, accidental (unintentional), initial encounter: Secondary | ICD-10-CM | POA: Diagnosis not present

## 2020-07-25 DIAGNOSIS — R0789 Other chest pain: Secondary | ICD-10-CM | POA: Diagnosis not present

## 2020-07-25 DIAGNOSIS — R404 Transient alteration of awareness: Secondary | ICD-10-CM | POA: Diagnosis not present

## 2020-07-25 DIAGNOSIS — R079 Chest pain, unspecified: Secondary | ICD-10-CM | POA: Diagnosis not present

## 2020-07-26 LAB — HEPATITIS B DNA, QUANTITATIVE: Hep B DNA (IU/mL): <10 IU/mL

## 2020-07-26 LAB — ASPARTATE TRANSAMINASE: AST: 20 IU/L (ref 0–40)

## 2020-07-26 LAB — ALANINE TRANSAMINASE: Alanine transaminase: 19 IU/L (ref 0–32)

## 2020-07-26 LAB — HEPATITIS B E ANTIGEN: Hep B e Ag: POSITIVE — AB

## 2020-07-26 LAB — HEPATITIS B E ANTIBODY: Hep B e Ab: POSITIVE — AB

## 2020-08-14 DIAGNOSIS — J029 Acute pharyngitis, unspecified: Secondary | ICD-10-CM | POA: Diagnosis not present

## 2020-08-14 DIAGNOSIS — R0981 Nasal congestion: Secondary | ICD-10-CM | POA: Diagnosis not present

## 2020-08-14 DIAGNOSIS — R030 Elevated blood-pressure reading, without diagnosis of hypertension: Secondary | ICD-10-CM | POA: Diagnosis not present

## 2020-08-14 DIAGNOSIS — J3489 Other specified disorders of nose and nasal sinuses: Secondary | ICD-10-CM | POA: Diagnosis not present

## 2020-08-14 DIAGNOSIS — R6883 Chills (without fever): Secondary | ICD-10-CM | POA: Diagnosis not present

## 2020-08-14 DIAGNOSIS — Z20822 Contact with and (suspected) exposure to covid-19: Secondary | ICD-10-CM | POA: Diagnosis not present

## 2020-08-14 DIAGNOSIS — J069 Acute upper respiratory infection, unspecified: Secondary | ICD-10-CM | POA: Diagnosis not present

## 2020-08-16 DIAGNOSIS — J069 Acute upper respiratory infection, unspecified: Secondary | ICD-10-CM | POA: Diagnosis not present

## 2020-08-30 DIAGNOSIS — B078 Other viral warts: Secondary | ICD-10-CM | POA: Diagnosis not present

## 2020-08-30 DIAGNOSIS — M25562 Pain in left knee: Secondary | ICD-10-CM | POA: Diagnosis not present

## 2020-08-30 DIAGNOSIS — G8929 Other chronic pain: Secondary | ICD-10-CM | POA: Diagnosis not present

## 2020-08-30 DIAGNOSIS — B079 Viral wart, unspecified: Secondary | ICD-10-CM | POA: Diagnosis not present

## 2020-08-30 DIAGNOSIS — L700 Acne vulgaris: Secondary | ICD-10-CM | POA: Diagnosis not present

## 2020-08-30 DIAGNOSIS — K219 Gastro-esophageal reflux disease without esophagitis: Secondary | ICD-10-CM | POA: Diagnosis not present

## 2020-08-31 DIAGNOSIS — L989 Disorder of the skin and subcutaneous tissue, unspecified: Secondary | ICD-10-CM | POA: Diagnosis not present

## 2020-08-31 DIAGNOSIS — K625 Hemorrhage of anus and rectum: Secondary | ICD-10-CM | POA: Diagnosis not present

## 2020-09-29 ENCOUNTER — Emergency Department (HOSPITAL_COMMUNITY)
Admission: EM | Admit: 2020-09-29 | Discharge: 2020-09-29 | Disposition: A | Discharge status: Home / Self Care | Payer: Medicare Other | Attending: Emergency Medicine | Admitting: Emergency Medicine

## 2020-09-29 ENCOUNTER — Encounter (HOSPITAL_COMMUNITY): Payer: Self-pay | Admitting: Emergency Medicine

## 2020-09-29 DIAGNOSIS — Z20822 Contact with and (suspected) exposure to covid-19: Secondary | ICD-10-CM | POA: Diagnosis not present

## 2020-09-29 DIAGNOSIS — F17210 Nicotine dependence, cigarettes, uncomplicated: Secondary | ICD-10-CM | POA: Diagnosis not present

## 2020-09-29 DIAGNOSIS — Z79899 Other long term (current) drug therapy: Secondary | ICD-10-CM | POA: Diagnosis not present

## 2020-09-29 DIAGNOSIS — R5383 Other fatigue: Secondary | ICD-10-CM | POA: Diagnosis present

## 2020-09-29 DIAGNOSIS — R000 Tachycardia, unspecified: Secondary | ICD-10-CM | POA: Insufficient documentation

## 2020-09-29 LAB — CBC WITH DIFFERENTIAL/PLATELET
Abs Immature Granulocytes: 0.02 10*3/uL (ref 0.00–0.07)
Basophils Absolute: 0.1 10*3/uL (ref 0.0–0.1)
Basophils Relative: 1 %
Eosinophils Absolute: 0.2 10*3/uL (ref 0.0–0.5)
Eosinophils Relative: 3 %
HCT: 40.8 % (ref 36.0–46.0)
Hemoglobin: 13.2 g/dL (ref 12.0–15.0)
Immature Granulocytes: 0 %
Lymphocytes Relative: 19 %
Lymphs Abs: 1.4 10*3/uL (ref 0.7–4.0)
MCH: 28.9 pg (ref 26.0–34.0)
MCHC: 32.4 g/dL (ref 30.0–36.0)
MCV: 89.3 fL (ref 80.0–100.0)
Monocytes Absolute: 0.5 10*3/uL (ref 0.1–1.0)
Monocytes Relative: 6 %
Neutro Abs: 5.2 10*3/uL (ref 1.7–7.7)
Neutrophils Relative %: 71 %
Platelets: 338 10*3/uL (ref 150–400)
RBC: 4.57 MIL/uL (ref 3.87–5.11)
RDW: 15.1 % (ref 11.5–15.5)
WBC: 7.3 10*3/uL (ref 4.0–10.5)
nRBC: 0 % (ref 0.0–0.2)

## 2020-09-29 LAB — SALICYLATE LEVEL: Salicylate Lvl: <7 mg/dL — ABNORMAL LOW (ref 7.0–30.0)

## 2020-09-29 LAB — URINALYSIS, ROUTINE W REFLEX MICROSCOPIC
Bilirubin Urine: NEGATIVE
Glucose, UA: NEGATIVE mg/dL
Ketones, ur: NEGATIVE mg/dL
Leukocytes,Ua: NEGATIVE
Nitrite: NEGATIVE
Protein, ur: NEGATIVE mg/dL
Specific Gravity, Urine: 1.003 — ABNORMAL LOW (ref 1.005–1.030)
pH: 7 (ref 5.0–8.0)

## 2020-09-29 LAB — RAPID URINE DRUG SCREEN, HOSP PERFORMED
Amphetamines: POSITIVE — AB
Barbiturates: NOT DETECTED
Benzodiazepines: POSITIVE — AB
Cocaine: NOT DETECTED
Opiates: NOT DETECTED
Tetrahydrocannabinol: POSITIVE — AB

## 2020-09-29 LAB — COMPREHENSIVE METABOLIC PANEL
ALT: 22 U/L (ref 0–44)
AST: 18 U/L (ref 15–41)
Albumin: 3.5 g/dL (ref 3.5–5.0)
Alkaline Phosphatase: 60 U/L (ref 38–126)
Anion gap: 5 (ref 5–15)
BUN: 7 mg/dL (ref 6–20)
CO2: 27 mmol/L (ref 22–32)
Calcium: 8.8 mg/dL — ABNORMAL LOW (ref 8.9–10.3)
Chloride: 104 mmol/L (ref 98–111)
Creatinine, Ser: 0.92 mg/dL (ref 0.44–1.00)
GFR, Estimated: >60 mL/min (ref >60)
Glucose, Bld: 101 mg/dL — ABNORMAL HIGH (ref 70–99)
Potassium: 3.6 mmol/L (ref 3.5–5.1)
Sodium: 136 mmol/L (ref 135–145)
Total Bilirubin: 0.4 mg/dL (ref 0.3–1.2)
Total Protein: 6.3 g/dL — ABNORMAL LOW (ref 6.5–8.1)

## 2020-09-29 LAB — TSH: TSH: 0.649 u[IU]/mL (ref 0.350–4.500)

## 2020-09-29 LAB — ACETAMINOPHEN LEVEL: Acetaminophen (Tylenol), Serum: <10 ug/mL — ABNORMAL LOW (ref 10–30)

## 2020-09-29 LAB — SARS CORONAVIRUS 2 (TAT 6-24 HRS): SARS Coronavirus 2: NEGATIVE

## 2020-09-29 LAB — PREGNANCY, URINE: Preg Test, Ur: NEGATIVE

## 2020-09-29 LAB — LIPASE, BLOOD: Lipase: 42 U/L (ref 11–51)

## 2020-09-29 MED ORDER — SODIUM CHLORIDE 0.9 % IV BOLUS
1000.0000 mL | Freq: Once | INTRAVENOUS | Status: AC
  Administered 2020-09-29: 1000 mL via INTRAVENOUS

## 2020-09-29 NOTE — BH Assessment (Signed)
Pt not medically cleared as of 1534. RN to notify TTS when pt is medically cleared.

## 2020-09-29 NOTE — ED Notes (Signed)
Pt given sprite for fluid challenge °

## 2020-09-29 NOTE — ED Notes (Signed)
Pt very adamently requesting to leave, PA notified. PA wishes to speak to pt's mother. Pt's mother's presence requested from waiting room
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
## 2020-09-29 NOTE — ED Notes (Signed)
Pt ready for discharge. PA completing discharge papers, pt dressing and preparing for discharge, Pt states that her is in the car waiting on her.

## 2020-09-29 NOTE — Discharge Instructions (Signed)
Please read the attached information follow-up with your psychiatrist.  I strongly recommend finding a therapist as well.  I also strongly recommend following up with the [place] [facility].  They have social workers available and can help you with additional needs.  Please return to the ER for any new or concerning symptoms.

## 2020-11-16 ENCOUNTER — Ambulatory Visit: Admit: 2020-11-16 | Discharge: 2020-11-16 | Payer: PRIVATE HEALTH INSURANCE

## 2020-11-16 DIAGNOSIS — B181 Chronic viral hepatitis B without delta-agent: Secondary | ICD-10-CM

## 2020-11-16 NOTE — Progress Notes (Signed)
Nov 16, 2020     Yocelin Vanlue [id number]     Probe: M+  ICD10: B18.1     Indication (underlying disease/condition): Chronic viral hepatitis B without delta agent and without coma     FIBROSCAN 502     Median Stiffness: 7.7 kPa  Interquartile stiffness range/med: 10 %  (Goal <30%)  Number of Valid Measurements: 10  Total Number of Measurements: 10    Controlled Attenuation Parameter (CAP) : Median 200 IQR range: 9     Success rate: 100 %    (Goal >60%)                  Scan reliable: Yes     Fasting: Yes                                             Alcohol: No       Procedure Performed by: ADELOSREYES    Interpretation:   Fibrosis- likely mild fibrosis (F1-2/4) without evidence of cirrhosis or portal hypertension      Controlled Attenuation Parameter (CAP): indicates No severe steatosis  CAP >300 dB/M indicates severe steatosis       Assessment of liver fibrosis:  The gold standard for assessing liver fibrosis is liver biopsy.  However in 10-2012 the FDA approved Fibroscan to assess fibrosis in a noninvasive manner.  Fibroscan has been shown to assess fibrosis in patients with various liver diseases and correlates well with liver biopsy.  Fibroscan does not assess inflammation of the liver nor the cause of the liver fibrosis. The first step is assessing fibrosis will be a Fibroscan. If further assessment of cause of liver disease, site of fibrosis or amount of inflammation is required then a liver biopsy will be needed.    Interpretation performed by: Sharlynn Oliphant, MD       Fibroscan results can be affected by non-fasting state (fast 3 hours), high ALT (>100 U/mL), hepatic congestion, steatohepatitis, alcohol, and cholestasis.      Fibroscan Rationale:  The Food and Drug Administration (FDA) has approved market clearance for Fibroscan, a novel medical device that measures liver stiffness. Vibration controlled elastography with Fibroscan uses a modified ultrasound probe to measure the velocity of a shear wave created  by vibratory source. Establishing the presence of fibrosis or cirrhosis in patients with chronic liver disease is important for assessment of prognosis and for evidence of progressive disease (fibrosis). The diagnosis of cirrhosis is important to initiate screening for varices and liver cancer. Fibroscan can be used as a noninvasive alternative to a liver biopsy.    Procedure:  After verbal explanation, the patient was placed in the supine position with right hand above head. Pre-measurement data confirmed the transient elastography probe was centered over the liver parenchyma. 2D image and elastrogram were evaluated for good transmission and positioning. A series of ten 50Hz  mechanical pulses were applied with controlled application pressure to induce a mechanical shear wave in the liver tissue. For each measurement, the shear wave propagation speed was detected, displayed and converted to its equivalent liver stiffness value in kilopascals. Skin to liver capsule distance and shear wave characteristics were monitored during the entire examination to assure data quality. Median liver stiffness measurement and interquartile range were calculated and displayed in real time. Acquired measurement data was stored and submitted to the physician for review and interpretation.  Patient  tolerated the procedure well and was discharged without incident.

## 2020-11-29 MED ORDER — ENTECAVIR 0.5 MG TABLET
0.5 mg | ORAL_TABLET | Freq: Every day | ORAL | 3 refills | Status: DC
Start: 2020-11-29 — End: 2021-11-04

## 2020-12-13 ENCOUNTER — Other Ambulatory Visit: Payer: Self-pay

## 2020-12-13 ENCOUNTER — Ambulatory Visit (INDEPENDENT_AMBULATORY_CARE_PROVIDER_SITE_OTHER)
Admission: EM | Admit: 2020-12-13 | Discharge: 2020-12-13 | Disposition: A | Discharge status: Other Acute Inpatient Hospital | Payer: Medicare Other | Source: Home / Self Care

## 2020-12-13 ENCOUNTER — Emergency Department (HOSPITAL_COMMUNITY)
Admission: EM | Admit: 2020-12-13 | Discharge: 2020-12-14 | Disposition: A | Discharge status: Home / Self Care | Payer: Medicare Other | Attending: Emergency Medicine | Admitting: Emergency Medicine

## 2020-12-13 ENCOUNTER — Encounter (HOSPITAL_COMMUNITY): Payer: Self-pay

## 2020-12-13 DIAGNOSIS — Z20822 Contact with and (suspected) exposure to covid-19: Secondary | ICD-10-CM | POA: Diagnosis not present

## 2020-12-13 DIAGNOSIS — Z046 Encounter for general psychiatric examination, requested by authority: Secondary | ICD-10-CM | POA: Diagnosis not present

## 2020-12-13 DIAGNOSIS — F3110 Bipolar disorder, current episode manic without psychotic features, unspecified: Secondary | ICD-10-CM | POA: Insufficient documentation

## 2020-12-13 DIAGNOSIS — Z79899 Other long term (current) drug therapy: Secondary | ICD-10-CM | POA: Diagnosis not present

## 2020-12-13 DIAGNOSIS — F22 Delusional disorders: Secondary | ICD-10-CM | POA: Diagnosis present

## 2020-12-13 DIAGNOSIS — F600 Paranoid personality disorder: Secondary | ICD-10-CM | POA: Insufficient documentation

## 2020-12-13 DIAGNOSIS — F17210 Nicotine dependence, cigarettes, uncomplicated: Secondary | ICD-10-CM | POA: Diagnosis not present

## 2020-12-13 DIAGNOSIS — Z653 Problems related to other legal circumstances: Secondary | ICD-10-CM | POA: Insufficient documentation

## 2020-12-13 LAB — CBC WITH DIFFERENTIAL/PLATELET
Abs Immature Granulocytes: 0.02 10*3/uL (ref 0.00–0.07)
Basophils Absolute: 0.1 10*3/uL (ref 0.0–0.1)
Basophils Relative: 1 %
Eosinophils Absolute: 0.2 10*3/uL (ref 0.0–0.5)
Eosinophils Relative: 4 %
HCT: 37.3 % (ref 36.0–46.0)
Hemoglobin: 12.4 g/dL (ref 12.0–15.0)
Immature Granulocytes: 0 %
Lymphocytes Relative: 29 %
Lymphs Abs: 1.8 10*3/uL (ref 0.7–4.0)
MCH: 29.6 pg (ref 26.0–34.0)
MCHC: 33.2 g/dL (ref 30.0–36.0)
MCV: 89 fL (ref 80.0–100.0)
Monocytes Absolute: 0.4 10*3/uL (ref 0.1–1.0)
Monocytes Relative: 7 %
Neutro Abs: 3.7 10*3/uL (ref 1.7–7.7)
Neutrophils Relative %: 59 %
Platelets: 298 10*3/uL (ref 150–400)
RBC: 4.19 MIL/uL (ref 3.87–5.11)
RDW: 14.4 % (ref 11.5–15.5)
WBC: 6.3 10*3/uL (ref 4.0–10.5)
nRBC: 0 % (ref 0.0–0.2)

## 2020-12-13 LAB — COMPREHENSIVE METABOLIC PANEL
ALT: 21 U/L (ref 0–44)
AST: 18 U/L (ref 15–41)
Albumin: 3.6 g/dL (ref 3.5–5.0)
Alkaline Phosphatase: 62 U/L (ref 38–126)
Anion gap: 8 (ref 5–15)
BUN: 12 mg/dL (ref 6–20)
CO2: 27 mmol/L (ref 22–32)
Calcium: 9.3 mg/dL (ref 8.9–10.3)
Chloride: 102 mmol/L (ref 98–111)
Creatinine, Ser: 1.06 mg/dL — ABNORMAL HIGH (ref 0.44–1.00)
GFR, Estimated: >60 mL/min (ref >60)
Glucose, Bld: 106 mg/dL — ABNORMAL HIGH (ref 70–99)
Potassium: 3.6 mmol/L (ref 3.5–5.1)
Sodium: 137 mmol/L (ref 135–145)
Total Bilirubin: 0.5 mg/dL (ref 0.3–1.2)
Total Protein: 6.2 g/dL — ABNORMAL LOW (ref 6.5–8.1)

## 2020-12-13 LAB — I-STAT BETA HCG BLOOD, ED (MC, WL, AP ONLY): I-stat hCG, quantitative: <5 m[IU]/mL (ref <5)

## 2020-12-13 LAB — ETHANOL: Alcohol, Ethyl (B): <10 mg/dL (ref <10)

## 2020-12-13 LAB — RESP PANEL BY RT-PCR (FLU A&B, COVID) ARPGX2
Influenza A by PCR: NEGATIVE
Influenza B by PCR: NEGATIVE
SARS Coronavirus 2 by RT PCR: NEGATIVE

## 2020-12-13 MED ORDER — ALPRAZOLAM 0.5 MG PO TABS
2.0000 mg | ORAL_TABLET | Freq: Once | ORAL | Status: AC
  Administered 2020-12-13: 2 mg via ORAL
  Filled 2020-12-13: qty 4
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
## 2020-12-13 NOTE — ED Provider Notes (Signed)
[facility] EMERGENCY DEPARTMENT Provider Note   CSN: [id number] Arrival date & time: 12/13/20  1629     History Chief Complaint  Patient presents with   Depression    Misty Young is a 45 y.o. female.  The history is provided by the patient and medical records. No language interpreter was used.  Mental Health Problem Presenting symptoms: delusional and paranoid behavior   Presenting symptoms: no agitation, no homicidal ideas, no suicidal thoughts and no suicidal threats   Patient accompanied by:  Law enforcement Degree of incapacity (severity):  Moderate Onset quality:  Gradual Timing:  Constant Progression:  Unable to specify Chronicity:  New Context: noncompliance and stressful life event   Treatment compliance:  Untreated Relieved by:  Nothing Worsened by:  Nothing Ineffective treatments:  None tried Associated symptoms: no abdominal pain, no chest pain, no fatigue and no headaches   Risk factors: hx of mental illness       Past Medical History:  Diagnosis Date   Attention deficit disorder    Bipolar 1 disorder (HCC)    Hemorrhoids    uses Preparation H wipes and cream   Kidney stones    Opiate use     Patient Active Problem List   Diagnosis Date Noted   Overdose 02/29/2016   Acute encephalopathy 02/29/2016   ADHD (attention deficit hyperactivity disorder) 02/29/2016   Ambien accidental overdose 02/29/2016   E. coli UTI 03/28/2015   Generalized anxiety disorder 03/28/2015   Pyelonephritis 03/27/2015   Smoking trying to quit 03/27/2015   Hypokalemia 03/27/2015   Abnormal LFTs 06/12/2014   Abnormal liver CT 06/12/2014   Rectal bleeding 06/12/2014   Abdominal swelling 06/12/2014   Constipation 06/12/2014   Hemorrhoids 06/12/2014   Umbilical hernia 05/20/2012    Past Surgical History:  Procedure Laterality Date   CESAREAN SECTION  2002/2004/2009/2013   x4   INSERTION OF MESH  06/15/2012   Procedure: INSERTION OF MESH;   Surgeon: Ernestene Mention, MD;  Location: [facility];  Service: General;  Laterality: N/A;   LITHOTRIPSY     THERAPEUTIC ABORTION     UMBILICAL HERNIA REPAIR  06/15/2012   Procedure: HERNIA REPAIR UMBILICAL ADULT;  Surgeon: Ernestene Mention, MD;  Location: [facility];  Service: General;  Laterality: N/A;     OB History     Gravida  6   Para  4   Term  4   Preterm      AB  2   Living  4      SAB  1   IAB  1   Ectopic      Multiple      Live Births  1           Family History  Problem Relation Age of Onset   Diabetes Maternal Grandfather    Colon cancer Neg Hx     Social History   Tobacco Use   Smoking status: Every Day    Packs/day: 0.50    Years: 15.00    Pack years: 7.50    Types: Cigarettes   Smokeless tobacco: Never  Substance Use Topics   Alcohol use: Not Currently    Alcohol/week: 0.0 standard drinks    Comment: Denies ETOH (10/13/18); previously: occasionally   Drug use: Not Currently    Types: Methamphetamines    Comment: occ marijuana    Home Medications Prior to Admission medications   Medication Sig Start Date End Date Taking? Authorizing Provider  lamoTRIgine (LAMICTAL) 100 MG tablet Take 25-100 mg by mouth See admin instructions. Take 25 mg at bedtime for 2 weeks, then 50 mg at bedtime for 2 weeks, then 100 mg at bedtime  12/13/20  [provider]  omeprazole (PRILOSEC) 20 MG capsule Take 20 mg by mouth See admin instructions. Take 20 mg by mouth daily, may take a second 20 mg dose as needed for heartburn  12/13/20  [provider]    Allergies    Tramadol hcl and Penicillins  Review of Systems   Review of Systems  Constitutional:  Negative for chills, fatigue and fever.  HENT:  Negative for congestion.   Respiratory:  Negative for cough, chest tightness and shortness of breath.   Cardiovascular:  Negative for chest pain.  Gastrointestinal:  Negative for abdominal pain, constipation, diarrhea, nausea and vomiting.   Genitourinary:  Negative for flank pain.  Musculoskeletal:  Negative for back pain, neck pain and neck stiffness.  Skin:  Negative for wound.  Neurological:  Negative for light-headedness and headaches.  Psychiatric/Behavioral:  Positive for paranoia. Negative for agitation, confusion, homicidal ideas and suicidal ideas.   All other systems reviewed and are negative.  Physical Exam Updated Vital Signs BP 106/77 (BP Location: Left Arm)   Pulse 87   Temp (!) 97.5 F (36.4 C) (Oral)   Resp 16   Ht 5\' 10"  (1.778 m)   Wt 108 kg   SpO2 100%   BMI 34.15 kg/m   Physical Exam Vitals and nursing note reviewed.  Constitutional:      General: She is not in acute distress.    Appearance: She is well-developed. She is not ill-appearing, toxic-appearing or diaphoretic.  HENT:     Head: Normocephalic and atraumatic.  Eyes:     Conjunctiva/sclera: Conjunctivae normal.  Cardiovascular:     Rate and Rhythm: Normal rate and regular rhythm.     Heart sounds: No murmur heard. Pulmonary:     Effort: Pulmonary effort is normal. No respiratory distress.     Breath sounds: Normal breath sounds.  Abdominal:     Palpations: Abdomen is soft.     Tenderness: There is no abdominal tenderness.  Musculoskeletal:        General: No tenderness.     Cervical back: Neck supple.     Right lower leg: No edema.     Left lower leg: No edema.  Skin:    General: Skin is warm and dry.     Findings: No erythema.  Neurological:     General: No focal deficit present.     Mental Status: She is alert.  Psychiatric:        Thought Content: Thought content is paranoid and delusional. Thought content does not include homicidal or suicidal ideation.    ED Results / Procedures / Treatments   Labs (all labs ordered are listed, but only abnormal results are displayed) Labs Reviewed  COMPREHENSIVE METABOLIC PANEL - Abnormal; Notable for the following components:      Result Value   Glucose, Bld 106 (*)     Creatinine, Ser 1.06 (*)    Total Protein 6.2 (*)    All other components within normal limits  RESP PANEL BY RT-PCR (FLU A&B, COVID) ARPGX2  ETHANOL  CBC WITH DIFFERENTIAL/PLATELET  RAPID URINE DRUG SCREEN, HOSP PERFORMED  I-STAT BETA HCG BLOOD, ED ([facility], [facility], [facility] ONLY)  I-STAT BETA HCG BLOOD, ED ([facility], [facility], [facility] ONLY)    EKG None  Radiology No results  found.  Procedures Procedures   Medications Ordered in ED Medications - No data to display  ED Course  I have reviewed the triage vital signs and the nursing notes.  Pertinent labs & imaging results that were available during my care of the patient were reviewed by me and considered in my medical decision making (see chart for details).    MDM Rules/Calculators/A&P                          Misty Young is a 45 y.o. female with a past medical history significant for anxiety, ADHD, and bipolar disorder who presents under IVC from behavioral health urgent care for further medical clearance and evaluation of paranoia and delusions.  According to IVC paperwork, patient has been having paranoid delusions about a significant other that may be trying to harm her.  She otherwise denied SI and HI to me.  She reports that during her recent incarceration, she was not on her psychiatric medications and thinks this may be contributing.  She denies hallucinations at this time.  She denies any physical complaints and having no fevers, chills, cough, chest pain or shortness breath, nausea vomiting, or other complaints.  On exam, patient resting comfortably.  Lungs clear and chest nontender.  Abdomen nontender.  Patient well-appearing.  Patient is still describing the paranoid delusions that caused her to be placed on involuntary commitment by psychiatry.  First exam paperwork was filled out and TTS consult was placed.  We will get the medical clearance laboratory testing performed and anticipate following up on psychiatry recommendations.  Patient  resting comfortably.  Anticipate follow-up on psychiatry recommendations.  12:28 AM Labs were all reassuring and similar to prior.  UDS has not been completed but otherwise no significant findings.  Patient is felt medically clear for psychiatric management.   Final Clinical Impression(s) / ED Diagnoses Final diagnoses:  Involuntary commitment    Clinical Impression: 1. Involuntary commitment     Disposition: Awaiting psychiatric evaluation and recommendations  This note was prepared with assistance of Dragon voice recognition software. Occasional wrong-word or sound-a-like substitutions may have occurred due to the inherent limitations of voice recognition software.      Rony Ratz, Canary Brim, MD 12/14/20 [phone]

## 2020-12-13 NOTE — ED Notes (Signed)
Pt psych exam unremarkable at this time. Pt calm and cooperative w/ not aggressive or escalating behaviors.
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
## 2020-12-13 NOTE — ED Notes (Signed)
Security at bedside and is being wanded

## 2020-12-13 NOTE — ED Notes (Signed)
IVC paperwork was given to the nurse for H11

## 2020-12-13 NOTE — Progress Notes (Signed)
Misty Young transferred to [facility] per NP order. Discussed with the patient and all questions fully answered. An After Visit Summary, EMTALA and Med Necessity forms were printed and to be given to the receiving nurse. Report given to [facility], [facility]. Patient escorted out and transferred to [facility] via [employer] Misty Young  Misty Young  12/13/2020 4:21 PM

## 2020-12-13 NOTE — Progress Notes (Addendum)
ADDENDUM  Per [facility] AC, no appropriate beds available at this time. AC encouraged CSW to refer out.   Signed:  Corky Crafts, MSW, [place], LCASA 12/13/2020 10:19 PM    Patient information has been sent to [facility] via secure chat to review for potential admission. Patient meets inpatient criteria per Vernard Gambles, NP.   Situation ongoing, CSW will continue to monitor progress.    Signed:  Corky Crafts, MSW, [place], LCASA 12/13/2020 9:23 PM
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
## 2020-12-13 NOTE — ED Notes (Signed)
Pt's bible searched and left in pt possession.

## 2020-12-13 NOTE — Progress Notes (Signed)
Nurse report called to [facility] charge Nurse Verlon Au.

## 2020-12-13 NOTE — Progress Notes (Signed)
Patient meets inpatient criteria per Julaine Fusi. Patient referred to the following facilities:  Destination  Service Provider Address Phone Fax  [facility]  [address], [place] [address] [phone]  [facility] [address]  [address] [place] [address] [phone]  [facility]  [address] [place] [address] [phone]  [facility]  [address] [place] [address] [phone]  [facility]  [address] [place] [address] [phone]  [facility]  [address], [place] [address] [phone]  [facility]  [address], [place] [address] [phone]  [facility]  [address] [place] [address] [phone]  [facility]  [address] [place] [address] [phone]     CSW will continue to monitor disposition.   Signed:  Corky Crafts, MSW, LCSWA, LCASA 12/13/2020 10:20 PM

## 2020-12-13 NOTE — Discharge Instructions (Addendum)
Transfer to [facility]  
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
## 2020-12-13 NOTE — Progress Notes (Signed)
Patient received xanax 2mg  PO.

## 2020-12-13 NOTE — Progress Notes (Signed)
RN called [employer] to serve and transport patient to [facility] ED.

## 2020-12-13 NOTE — ED Notes (Signed)
Pt changed into scrubs and belongings inventoried and placed in locker 3. Pt has 3 bags and 1 duffle bag. Meds given to pharmacy and valuables given to security.

## 2020-12-14 ENCOUNTER — Inpatient Hospital Stay (HOSPITAL_COMMUNITY)
Admission: AD | Admit: 2020-12-14 | Discharge: 2020-12-21 | DRG: 885 | Disposition: A | Discharge status: Home / Self Care | Payer: Medicare Other | Source: Intra-hospital | Attending: Psychiatry | Admitting: Psychiatry

## 2020-12-14 ENCOUNTER — Other Ambulatory Visit: Payer: Self-pay

## 2020-12-14 ENCOUNTER — Encounter (HOSPITAL_COMMUNITY): Payer: Self-pay | Admitting: Psychiatry

## 2020-12-14 DIAGNOSIS — F909 Attention-deficit hyperactivity disorder, unspecified type: Secondary | ICD-10-CM | POA: Diagnosis present

## 2020-12-14 DIAGNOSIS — F22 Delusional disorders: Secondary | ICD-10-CM | POA: Diagnosis present

## 2020-12-14 DIAGNOSIS — Z87442 Personal history of urinary calculi: Secondary | ICD-10-CM | POA: Diagnosis not present

## 2020-12-14 DIAGNOSIS — Z79899 Other long term (current) drug therapy: Secondary | ICD-10-CM | POA: Diagnosis not present

## 2020-12-14 DIAGNOSIS — R110 Nausea: Secondary | ICD-10-CM | POA: Diagnosis not present

## 2020-12-14 DIAGNOSIS — Z23 Encounter for immunization: Secondary | ICD-10-CM

## 2020-12-14 DIAGNOSIS — Z888 Allergy status to other drugs, medicaments and biological substances status: Secondary | ICD-10-CM | POA: Diagnosis not present

## 2020-12-14 DIAGNOSIS — F410 Panic disorder [episodic paroxysmal anxiety] without agoraphobia: Secondary | ICD-10-CM | POA: Diagnosis present

## 2020-12-14 DIAGNOSIS — Z880 Allergy status to penicillin: Secondary | ICD-10-CM

## 2020-12-14 DIAGNOSIS — F3110 Bipolar disorder, current episode manic without psychotic features, unspecified: Secondary | ICD-10-CM | POA: Diagnosis not present

## 2020-12-14 DIAGNOSIS — Z5900 Homelessness unspecified: Secondary | ICD-10-CM | POA: Diagnosis not present

## 2020-12-14 DIAGNOSIS — F17210 Nicotine dependence, cigarettes, uncomplicated: Secondary | ICD-10-CM | POA: Diagnosis present

## 2020-12-14 DIAGNOSIS — F319 Bipolar disorder, unspecified: Principal | ICD-10-CM | POA: Diagnosis present

## 2020-12-14 LAB — RAPID URINE DRUG SCREEN, HOSP PERFORMED
Amphetamines: POSITIVE — AB
Barbiturates: NOT DETECTED
Benzodiazepines: POSITIVE — AB
Cocaine: NOT DETECTED
Opiates: NOT DETECTED
Tetrahydrocannabinol: POSITIVE — AB

## 2020-12-14 MED ORDER — MAGNESIUM HYDROXIDE 400 MG/5ML PO SUSP: 30.0000 mL | Freq: Every day | ORAL | Status: DC | PRN

## 2020-12-14 MED ORDER — HYDROXYZINE HCL 25 MG PO TABS
25.0000 mg | ORAL_TABLET | Freq: Three times a day (TID) | ORAL | Status: DC | PRN
  Administered 2020-12-15: 25 mg via ORAL
  Filled 2020-12-14: qty 1

## 2020-12-14 MED ORDER — ALUM & MAG HYDROXIDE-SIMETH 200-200-20 MG/5ML PO SUSP: 30.0000 mL | ORAL | Status: DC | PRN

## 2020-12-14 MED ORDER — ACETAMINOPHEN 325 MG PO TABS
650.0000 mg | ORAL_TABLET | Freq: Four times a day (QID) | ORAL | Status: DC | PRN
  Administered 2020-12-15: 650 mg via ORAL
  Filled 2020-12-14: qty 2

## 2020-12-14 NOTE — ED Notes (Signed)
Report given to Will, RN at [facility]

## 2020-12-14 NOTE — Tx Team (Cosign Needed)
Initial Treatment Plan 12/14/2020 6:49 PM Misty Young:[id number]    PATIENT STRESSORS: Financial difficulties Marital or family conflict Substance abuse   PATIENT STRENGTHS: Wellsite geologist fund of knowledge   PATIENT IDENTIFIED PROBLEMS: "My moods go up and down"  Paranoid  "My mom just sent me a restraining order"                 DISCHARGE CRITERIA:  Improved stabilization in mood, thinking, and/or behavior  PRELIMINARY DISCHARGE PLAN: Outpatient therapy Placement in alternative living arrangements  PATIENT/FAMILY INVOLVEMENT: This treatment plan has been presented to and reviewed with the patient, [name]. The patient has been given the opportunity to ask questions and make suggestions.  Garnette Scheuermann, RN 12/14/2020, 6:49 PM

## 2020-12-14 NOTE — ED Notes (Signed)
Lunch tray at bedside. ?
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
## 2020-12-14 NOTE — ED Notes (Signed)
Officer [name] from [employer] office at bedside for transport to [facility]; pt's belongings, including home medications and valuables given to officer [name]

## 2020-12-14 NOTE — Progress Notes (Signed)
Pt accepted to [facility]    Patient meets inpatient criteria per Vernard Gambles, NP  Dr.Clary is the attending provider.    Call report to [phone]    Reginia Naas, RN @ [facility] notified.     Pt scheduled  to arrive at [facility] at 1330   Damita Dunnings, MSW, LCSW-A  11:06 AM 12/14/2020

## 2020-12-14 NOTE — Progress Notes (Signed)
Patient information has been sent to [facility] via secure chat for reconsideration. Patient meets inpatient criteria per Vernard Gambles, NP.   Situation ongoing, CSW will continue to monitor progress.    Signed:  Corky Crafts, MSW, [place], LCASA 12/14/2020 10:13 AM

## 2020-12-14 NOTE — ED Notes (Signed)
[facility] sheriff's office notified pt ready for transport to [facility] at 1330

## 2020-12-14 NOTE — ED Notes (Signed)
Breakfast at bedside.

## 2020-12-14 NOTE — Progress Notes (Signed)
Patient dd not attend wrap up group AA.

## 2020-12-15 LAB — LIPID PANEL
Cholesterol: 158 mg/dL (ref 0–200)
HDL: 39 mg/dL — ABNORMAL LOW (ref >40)
LDL Cholesterol: 98 mg/dL (ref 0–99)
Total CHOL/HDL Ratio: 4.1 RATIO
Triglycerides: 103 mg/dL (ref <150)
VLDL: 21 mg/dL (ref 0–40)

## 2020-12-15 LAB — TSH: TSH: 0.59 u[IU]/mL (ref 0.350–4.500)

## 2020-12-15 MED ORDER — OLANZAPINE 10 MG PO TABS
10.0000 mg | ORAL_TABLET | Freq: Once | ORAL | Status: DC
  Filled 2020-12-15: qty 1

## 2020-12-15 MED ORDER — ONDANSETRON HCL 4 MG PO TABS
4.0000 mg | ORAL_TABLET | Freq: Two times a day (BID) | ORAL | Status: DC | PRN
  Administered 2020-12-15 (×2): 4 mg via ORAL
  Filled 2020-12-15 (×2): qty 1

## 2020-12-15 MED ORDER — AMITRIPTYLINE HCL 50 MG PO TABS
50.0000 mg | ORAL_TABLET | Freq: Every day | ORAL | Status: DC
  Administered 2020-12-15 – 2020-12-20 (×6): 50 mg via ORAL
  Filled 2020-12-15: qty 1
  Filled 2020-12-15: qty 2
  Filled 2020-12-15 (×2): qty 1
  Filled 2020-12-15: qty 2
  Filled 2020-12-15 (×2): qty 1
  Filled 2020-12-15: qty 2
  Filled 2020-12-15 (×2): qty 1

## 2020-12-15 MED ORDER — AMITRIPTYLINE HCL 25 MG PO TABS
ORAL_TABLET | ORAL | Status: AC
  Filled 2020-12-15: qty 2

## 2020-12-15 MED ORDER — PNEUMOCOCCAL VAC POLYVALENT 25 MCG/0.5ML IJ INJ
0.5000 mL | INJECTION | INTRAMUSCULAR | Status: AC
  Administered 2020-12-16: 0.5 mL via INTRAMUSCULAR
  Filled 2020-12-15: qty 0.5

## 2020-12-15 MED ORDER — OLANZAPINE 10 MG PO TABS
10.0000 mg | ORAL_TABLET | Freq: Two times a day (BID) | ORAL | Status: DC
  Administered 2020-12-16 – 2020-12-18 (×5): 10 mg via ORAL
  Filled 2020-12-15 (×10): qty 1

## 2020-12-15 NOTE — Progress Notes (Signed)
   12/15/20 2209  Psych Admission Type (Psych Patients Only)  Admission Status Involuntary  Psychosocial Assessment  Patient Complaints Anxiety  Eye Contact Fair  Facial Expression Anxious  Affect Appropriate to circumstance  Speech Logical/coherent  Interaction Assertive  Motor Activity Other (Comment) (WDL)  Appearance/Hygiene Unremarkable  Behavior Characteristics Appropriate to situation  Mood Pleasant;Anxious  Thought Process  Coherency WDL  Content WDL  Delusions None reported or observed  Perception WDL  Hallucination None reported or observed  Judgment Poor  Confusion None  Danger to Self  Current suicidal ideation? Denies  Danger to Others  Danger to Others None reported or observed

## 2020-12-15 NOTE — Progress Notes (Signed)
Adult Psychoeducational Group Note  Date:  12/15/2020 Time:  10:48 AM  Group Topic/Focus:  Goals Group:   The focus of this group is to help patients establish daily goals to achieve during treatment and discuss how the patient can incorporate goal setting into their daily lives to aide in recovery.  Participation Level:  Did Not Attend  Deforest Hoyles [facility] 12/15/2020, 10:48 AM
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
## 2020-12-15 NOTE — Progress Notes (Signed)
Patient has remained in bed most of the shift. She did get up and sat on the bench in her room for approx 10 minutes today. Also, she did ambulate to the med room and took PRN Zofran at 1646 for nausea that was effective. She did refuse the 1630 Zyprexa dosage that I attempted to give at 1725 after the pharmacist reviewed the medication. Dr. Trinna Post made aware. He states we will evaluate if she take 2200 dosage. Denies SI/HI/AVH. Contracts verbally for safety.   [place] NOVEL CORONAVIRUS (COVID-19) DAILY CHECK-OFF SYMPTOMS - answer yes or no to each - every day NO YES  Have you had a fever in the past 24 hours?  Fever (Temp > 37.80C / 100F) X    Have you had any of these symptoms in the past 24 hours? New Cough  Sore Throat   Shortness of Breath  Difficulty Breathing  Unexplained Body Aches   X    Have you had any one of these symptoms in the past 24 hours not related to allergies?   Runny Nose  Nasal Congestion  Sneezing   X    If you have had runny nose, nasal congestion, sneezing in the past 24 hours, has it worsened?   X    EXPOSURES - check yes or no X    Have you traveled outside the state in the past 14 days?   X    Have you been in contact with someone with a confirmed diagnosis of COVID-19 or PUI in the past 14 days without wearing appropriate PPE?   X    Have you been living in the same home as a person with confirmed diagnosis of COVID-19 or a PUI (household contact)?     X    Have you been diagnosed with COVID-19?     X                                                                                                                             What to do next: Answered NO to all: Answered YES to anything:    Proceed with unit schedule Follow the [facility] Inpatient Flowsheet.

## 2020-12-15 NOTE — Progress Notes (Signed)
   12/14/20 1430  Psych Admission Type (Psych Patients Only)  Admission Status Involuntary  Psychosocial Assessment  Patient Complaints Anxiety;Depression;Crying spells;Irritability;Worrying  Eye Contact Glaring  Facial Expression Angry;Animated  Affect Anxious;Labile  Speech Aggressive;Argumentative;Pressured  Interaction Assertive;Demanding  Motor Activity Fidgety  Appearance/Hygiene Disheveled;Poor hygiene;In scrubs  Behavior Characteristics Anxious;Irritable  Mood Irritable;Ashamed/humiliated;Anxious  Thought Process  Coherency WDL  Content WDL  Delusions WDL  Perception WDL  Hallucination None reported or observed  Judgment WDL  Confusion WDL  Danger to Self  Current suicidal ideation? Denies  Danger to Others  Danger to Others None reported or observed
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
## 2020-12-16 NOTE — [facility] Group Notes (Signed)
[facility] LCSW Group Therapy Note  12/16/2020    Type of Therapy and Topic:  Group Therapy:  A Hero Worthy of Support  Participation Level:  Did Not Attend   Description of Group:  Patients in this group were introduced to the concept that additional supports including self-support are an essential part of recovery.  Matching needs with supports to help fulfill those needs was explained.  Establishing boundaries that can gradually be increased or decreased was described, with patients giving their own examples of establishing appropriate boundaries in their lives.  A song entitled "My Own Hero" was played and a group discussion ensued in which patients stated it inspired them to help themselves in order to succeed, because other people cannot achieve their goals such as sobriety or stability for them.  A song was played called "I Am Enough" which led to a discussion about being willing to believe we are worth the effort of being a self-support.   Therapeutic Goals: 1)  demonstrate the importance of being a key part of one's own support system 2)  discuss various available supports 3)  encourage patient to use music as part of their self-support and focus on goals 4)  elicit ideas from patients about supports that need to be added   Summary of Patient Progress:  The patient was invited to group, did not attend.  Therapeutic Modalities:   Motivational Interviewing Activity  Lupie Sawa J Grossman-Orr      

## 2020-12-16 NOTE — Progress Notes (Signed)
Pt has been very pleasant and cooperative this shift.  She had no complaints and is denying SI/HI.  Pt allowed EKG to be done today, (NSR) and took her Pneumovax without complaint.    12/16/20 0900  Psych Admission Type (Psych Patients Only)  Admission Status Involuntary  Psychosocial Assessment  Patient Complaints None  Eye Contact Fair  Facial Expression Anxious  Affect Appropriate to circumstance  Speech Logical/coherent  Interaction Assertive  Motor Activity Other (Comment) (WDL)  Appearance/Hygiene Unremarkable  Behavior Characteristics Appropriate to situation  Mood Pleasant  Thought Process  Coherency WDL  Content WDL  Delusions None reported or observed  Perception WDL  Hallucination None reported or observed  Judgment Poor  Confusion None  Danger to Self  Current suicidal ideation? Denies  Danger to Others  Danger to Others None reported or observed
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
## 2020-12-16 NOTE — [facility] Group Notes (Signed)
The focus of this group is to help patients establish daily goals to achieve during treatment and discuss how the patient can incorporate goal setting into their daily lives to aide in recovery.  Pt did not attend group 

## 2020-12-16 NOTE — Progress Notes (Signed)
Patient compliant with medications, denies SI/HI/A/VH and verbally contracted for safety. Support and encouragement provided as needed. Will continue to monitor, Q 15 minutes safety checks.   12/16/20 2200  Psych Admission Type (Psych Patients Only)  Admission Status Involuntary  Psychosocial Assessment  Patient Complaints Depression  Eye Contact Fair  Facial Expression Other (Comment) (WNL)  Affect Appropriate to circumstance  Speech Logical/coherent  Interaction Assertive  Motor Activity Other (Comment) (WDL)  Appearance/Hygiene Unremarkable  Behavior Characteristics Cooperative;Appropriate to situation  Mood Pleasant  Thought Process  Coherency WDL  Content WDL  Delusions None reported or observed  Perception WDL  Hallucination None reported or observed  Judgment Poor  Confusion None  Danger to Self  Current suicidal ideation? Denies  Danger to Others  Danger to Others None reported or observed

## 2020-12-17 LAB — HEMOGLOBIN A1C
Hgb A1c MFr Bld: 5.6 % (ref 4.8–5.6)
Mean Plasma Glucose: 114 mg/dL

## 2020-12-17 MED ORDER — THIAMINE HCL 100 MG PO TABS
100.0000 mg | ORAL_TABLET | Freq: Every day | ORAL | Status: DC
  Administered 2020-12-18 – 2020-12-21 (×4): 100 mg via ORAL
  Filled 2020-12-17 (×6): qty 1

## 2020-12-17 MED ORDER — LORAZEPAM 1 MG PO TABS: 1.0000 mg | ORAL_TABLET | Freq: Every day | ORAL | Status: DC

## 2020-12-17 MED ORDER — LORAZEPAM 1 MG PO TABS
1.0000 mg | ORAL_TABLET | Freq: Three times a day (TID) | ORAL | Status: DC
  Filled 2020-12-17: qty 1

## 2020-12-17 MED ORDER — ADULT MULTIVITAMIN W/MINERALS CH
1.0000 | ORAL_TABLET | Freq: Every day | ORAL | Status: DC
  Administered 2020-12-18 – 2020-12-21 (×4): 1 via ORAL
  Filled 2020-12-17 (×7): qty 1

## 2020-12-17 MED ORDER — THIAMINE HCL 100 MG PO TABS
100.0000 mg | ORAL_TABLET | Freq: Every day | ORAL | Status: DC
Start: 2020-12-18 — End: 2020-12-18
  Administered 2020-12-18: 100 mg via ORAL
  Filled 2020-12-17 (×3): qty 1

## 2020-12-17 MED ORDER — LORAZEPAM 1 MG PO TABS: 1.0000 mg | ORAL_TABLET | Freq: Four times a day (QID) | ORAL | Status: DC | PRN

## 2020-12-17 MED ORDER — HYDROXYZINE HCL 25 MG PO TABS
25.0000 mg | ORAL_TABLET | Freq: Four times a day (QID) | ORAL | Status: DC | PRN
  Administered 2020-12-17: 25 mg via ORAL
  Filled 2020-12-17: qty 1

## 2020-12-17 MED ORDER — LORAZEPAM 1 MG PO TABS
1.0000 mg | ORAL_TABLET | Freq: Four times a day (QID) | ORAL | Status: DC
  Administered 2020-12-17 – 2020-12-18 (×3): 1 mg via ORAL
  Filled 2020-12-17 (×2): qty 1

## 2020-12-17 MED ORDER — LOPERAMIDE HCL 2 MG PO CAPS: 2.0000 mg | ORAL_CAPSULE | ORAL | Status: AC | PRN

## 2020-12-17 MED ORDER — ONDANSETRON 4 MG PO TBDP
4.0000 mg | ORAL_TABLET | Freq: Four times a day (QID) | ORAL | Status: AC | PRN
  Administered 2020-12-18: 4 mg via ORAL
  Filled 2020-12-17: qty 1

## 2020-12-17 MED ORDER — THIAMINE HCL 100 MG/ML IJ SOLN: 100.0000 mg | Freq: Once | INTRAMUSCULAR | Status: DC

## 2020-12-17 MED ORDER — LORAZEPAM 1 MG PO TABS: 1.0000 mg | ORAL_TABLET | Freq: Two times a day (BID) | ORAL | Status: DC

## 2020-12-17 NOTE — Progress Notes (Addendum)
   12/17/20 1417  Vital Signs  Temp 98.3 F (36.8 C)  Temp Source Oral  Pulse Rate 89  Pulse Rate Source Monitor  BP 125/72  BP Location Left Arm  BP Method Automatic  Patient Position (if appropriate) Lying  Oxygen Therapy  SpO2 97 %  CIWA-Ar  Nausea and Vomiting 1  Tactile Disturbances 0  Tremor 0  Auditory Disturbances 0  Paroxysmal Sweats 0  Visual Disturbances 0  Anxiety 1  Headache, Fullness in Head 0  Agitation 0  Orientation and Clouding of Sensorium 0  CIWA-Ar Total 2   D: Patient denies SI/HI/AVH. Patient denied both anxiety and depression. Pt. Was isolative in her room. Pt complained of some nausea but refused meds for nausea. Pt. Reported that she takes 1 mg of Xanax a day 4 times a day and hasn't  had Xanax for 4 days. Pt. Was put on CIWA last 2 CIWA scores were 4. A:  Patient took scheduled medicine.  Support and encouragement provided Routine safety checks conducted every 15 minutes. Patient  Informed to notify staff with any concerns.   R:  Safety maintained.

## 2020-12-17 NOTE — Progress Notes (Signed)
Recreation Therapy Notes  Date:  6.13.22 Time: 0930 Location: [address] Dayroom  Group Topic: Stress Management  Goal Area(s) Addresses:  Patient will identify positive stress management techniques. Patient will identify benefits of using stress management post d/c.  Intervention: Stress Managment  Activity :  Meditation.  LRT played a meditation that focused on setting boundaries for your own self care. Patients were to listen and follow as meditation was read to fully engage in activity.  Education:  Stress Management, Discharge Planning.   Education Outcome: Acknowledges Education  Clinical Observations/Feedback:  Pt did not attend group session.    Caroll Rancher, LRT/CTRS         Caroll Rancher A 12/17/2020 11:32 AM
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
## 2020-12-17 NOTE — Tx Team (Signed)
Interdisciplinary Treatment and Diagnostic Plan Update  12/17/2020 Time of Session: 1:20pm Misty Young MRN: [id number]  Principal Diagnosis: Bipolar 1 disorder ([place])  Secondary Diagnoses: Principal Problem:   Bipolar 1 disorder ([place])   Current Medications:  Current Facility-Administered Medications  Medication Dose Route Frequency Provider Last Rate Last Admin   acetaminophen (TYLENOL) tablet 650 mg  650 mg Oral Q6H PRN White, Patrice L, NP   650 mg at 12/15/20 1152   alum & mag hydroxide-simeth (MAALOX/MYLANTA) 200-200-20 MG/5ML suspension 30 mL  30 mL Oral Q4H PRN White, Patrice L, NP       amitriptyline (ELAVIL) tablet 50 mg  50 mg Oral QHS Bobbitt, Shalon E, NP   50 mg at 12/16/20 2140   hydrOXYzine (ATARAX/VISTARIL) tablet 25 mg  25 mg Oral Q6H PRN Ethelene Hal, NP       loperamide (IMODIUM) capsule 2-4 mg  2-4 mg Oral PRN Ethelene Hal, NP       LORazepam (ATIVAN) tablet 1 mg  1 mg Oral QID Ethelene Hal, NP       Followed by   Derrill Memo ON 12/18/2020] LORazepam (ATIVAN) tablet 1 mg  1 mg Oral TID Ethelene Hal, NP       Followed by   Derrill Memo ON 12/19/2020] LORazepam (ATIVAN) tablet 1 mg  1 mg Oral BID Ethelene Hal, NP       Followed by   Derrill Memo ON 12/21/2020] LORazepam (ATIVAN) tablet 1 mg  1 mg Oral Daily Ethelene Hal, NP       magnesium hydroxide (MILK OF MAGNESIA) suspension 30 mL  30 mL Oral Daily PRN White, Patrice L, NP       multivitamin with minerals tablet 1 tablet  1 tablet Oral Daily Ethelene Hal, NP       OLANZapine [facility]) tablet 10 mg  10 mg Oral BID Briant Cedar, MD   10 mg at 12/17/20 4259   ondansetron (ZOFRAN-ODT) disintegrating tablet 4 mg  4 mg Oral Q6H PRN Ethelene Hal, NP       [START ON 12/18/2020] thiamine tablet 100 mg  100 mg Oral Daily Ethelene Hal, NP       Derrill Memo ON 12/18/2020] thiamine tablet 100 mg  100 mg Oral Daily Ethelene Hal, NP       PTA  Medications: Medications Prior to Admission  Medication Sig Dispense Refill Last Dose   ALPRAZolam (XANAX) 1 MG tablet Take 1 mg by mouth 3 (three) times daily.      amitriptyline (ELAVIL) 25 MG tablet Take 25-75 mg by mouth at bedtime.      amphetamine-dextroamphetamine (ADDERALL) 30 MG tablet Take 30 mg by mouth 3 (three) times daily.      gabapentin (NEURONTIN) 600 MG tablet Take 600 mg by mouth 4 (four) times daily as needed (pain).      LATUDA 120 MG TABS Take 120 mg by mouth every evening.      omeprazole (PRILOSEC) 20 MG capsule Take 20 mg by mouth in the morning and at bedtime.       Patient Stressors: Financial difficulties Marital or family conflict Substance abuse  Patient Strengths: Curator fund of knowledge  Treatment Modalities: Medication Management, Group therapy, Case management,  1 to 1 session with clinician, Psychoeducation, Recreational therapy.   Physician Treatment Plan for Primary Diagnosis: Bipolar 1 disorder ([place]) Long Term Goal(s): Improvement in symptoms so as ready for discharge   Short Term  Goals: Ability to identify changes in lifestyle to reduce recurrence of condition will improve Ability to verbalize feelings will improve Ability to demonstrate self-control will improve Ability to identify and develop effective coping behaviors will improve Ability to maintain clinical measurements within normal limits will improve Compliance with prescribed medications will improve Ability to identify triggers associated with substance abuse/mental health issues will improve  Medication Management: Evaluate patient's response, side effects, and tolerance of medication regimen.  Therapeutic Interventions: 1 to 1 sessions, Unit Group sessions and Medication administration.  Evaluation of Outcomes: Not Met  Physician Treatment Plan for Secondary Diagnosis: Principal Problem:   Bipolar 1 disorder ([place])  Long Term Goal(s): Improvement in  symptoms so as ready for discharge   Short Term Goals: Ability to identify changes in lifestyle to reduce recurrence of condition will improve Ability to verbalize feelings will improve Ability to demonstrate self-control will improve Ability to identify and develop effective coping behaviors will improve Ability to maintain clinical measurements within normal limits will improve Compliance with prescribed medications will improve Ability to identify triggers associated with substance abuse/mental health issues will improve     Medication Management: Evaluate patient's response, side effects, and tolerance of medication regimen.  Therapeutic Interventions: 1 to 1 sessions, Unit Group sessions and Medication administration.  Evaluation of Outcomes: Not Met   RN Treatment Plan for Primary Diagnosis: Bipolar 1 disorder ([place]) Long Term Goal(s): Knowledge of disease and therapeutic regimen to maintain health will improve  Short Term Goals: Ability to remain free from injury will improve, Ability to verbalize frustration and anger appropriately will improve, Ability to demonstrate self-control, Ability to identify and develop effective coping behaviors will improve, and Compliance with prescribed medications will improve  Medication Management: RN will administer medications as ordered by provider, will assess and evaluate patient's response and provide education to patient for prescribed medication. RN will report any adverse and/or side effects to prescribing provider.  Therapeutic Interventions: 1 on 1 counseling sessions, Psychoeducation, Medication administration, Evaluate responses to treatment, Monitor vital signs and CBGs as ordered, Perform/monitor CIWA, COWS, AIMS and Fall Risk screenings as ordered, Perform wound care treatments as ordered.  Evaluation of Outcomes: Not Met   LCSW Treatment Plan for Primary Diagnosis: Bipolar 1 disorder ([place]) Long Term Goal(s): Safe transition to  appropriate next level of care at discharge, Engage patient in therapeutic group addressing interpersonal concerns.  Short Term Goals: Engage patient in aftercare planning with referrals and resources, Increase social support, Increase ability to appropriately verbalize feelings, Identify triggers associated with mental health/substance abuse issues, and Increase skills for wellness and recovery  Therapeutic Interventions: Assess for all discharge needs, 1 to 1 time with Social worker, Explore available resources and support systems, Assess for adequacy in community support network, Educate family and significant other(s) on suicide prevention, Complete Psychosocial Assessment, Interpersonal group therapy.  Evaluation of Outcomes: Not Met   Progress in Treatment: Attending groups: No. Participating in groups: No. Taking medication as prescribed: Yes. Toleration medication: Yes. Family/Significant other contact made: No, will contact:  relative Patient understands diagnosis: Yes. Discussing patient identified problems/goals with staff: Yes. Medical problems stabilized or resolved: Yes. Denies suicidal/homicidal ideation: Yes. Issues/concerns per patient self-inventory: No.   New problem(s) identified: No, Describe:  none  New Short Term/Long Term Goal(s): detox, medication management for mood stabilization; elimination of SI thoughts; development of comprehensive mental wellness/sobriety plan  Patient Goals:  "To get back on my medicine and get back to feeling better"  Discharge Plan or  Barriers: Patient is currently declining all follow up.   Reason for Continuation of Hospitalization: Delusions  Hallucinations Medication stabilization Withdrawal symptoms  Estimated Length of Stay: 3-5 days  Attendees: Patient: Did not attend 12/17/2020   Physician: Ethelene Browns, MD 12/17/2020   Nursing:  12/17/2020   RN Care Manager: 12/17/2020   Social Worker: Darletta Moll 12/17/2020    Recreational Therapist:  12/17/2020   Other:  12/17/2020   Other:  12/17/2020   Other: 12/17/2020     Scribe for Treatment Team: Vassie Moselle, LCSW 12/17/2020 2:52 PM

## 2020-12-17 NOTE — B[facility] Group Notes (Signed)
Occupational Therapy Group Note Date: 12/17/2020 Group Topic/Focus: Strengths Exploration  Group Description: Group encouraged increased participation and engagement through discussion focused on STRENGTHS. Patients were encouraged to fill out a worksheet to structure discussion, that included identifying strengths as it relates to one's relationships, profession, and personal fulfillment. Discussion followed with patients sharing their responses and highlighting their own personal strengths.  Therapeutic Goals: Identify strengths vs weaknesses Discuss and identify ways we can highlight our strengths  Participation Level: Patient did not attend OT group session despite personal invitation.    Plan: Continue to engage patient in OT groups 2 - 3x/week.  12/17/2020  Demitrios Molyneux, MOT, OTR/L 

## 2020-12-17 NOTE — Progress Notes (Signed)
Psychoeducational Group Note  Date:  12/17/2020 Time:  2045  Group Topic/Focus:  Wrap-Up Group:   The focus of this group is to help patients review their daily goal of treatment and discuss progress on daily workbooks.  Participation Level: Did Not Attend  Participation Quality:  Not Applicable  Affect:  Not Applicable  Cognitive:  Not Applicable  Insight:  Not Applicable  Engagement in Group: Not Applicable  Additional Comments:  The patient did not attend group this evening.   Hazle Coca S 12/17/2020, 8:45 PM

## 2020-12-17 NOTE — Progress Notes (Signed)
Patient did not attend morning goals and activity group. 

## 2020-12-18 MED ORDER — OLANZAPINE 5 MG PO TABS
5.0000 mg | ORAL_TABLET | Freq: Two times a day (BID) | ORAL | Status: DC
  Administered 2020-12-19: 5 mg via ORAL
  Filled 2020-12-18 (×3): qty 1

## 2020-12-18 MED ORDER — GABAPENTIN 300 MG PO CAPS
300.0000 mg | ORAL_CAPSULE | Freq: Three times a day (TID) | ORAL | Status: DC
  Administered 2020-12-18 – 2020-12-21 (×10): 300 mg via ORAL
  Filled 2020-12-18 (×16): qty 1

## 2020-12-18 MED ORDER — GABAPENTIN 100 MG PO CAPS
200.0000 mg | ORAL_CAPSULE | Freq: Three times a day (TID) | ORAL | Status: DC
  Filled 2020-12-18 (×3): qty 2

## 2020-12-18 MED ORDER — PANTOPRAZOLE SODIUM 40 MG PO TBEC
80.0000 mg | DELAYED_RELEASE_TABLET | Freq: Every day | ORAL | Status: DC
  Administered 2020-12-18 – 2020-12-21 (×4): 80 mg via ORAL
  Filled 2020-12-18 (×6): qty 2

## 2020-12-18 NOTE — BHH Counselor (Signed)
CSW provided the patient with a packet of resources that contains: housing resources, free and reduced cost food resources, suicide prevention resources and numbers, and Good[employer] cards.  CSW also provided the client with a list of residential treatment options for substance use.

## 2020-12-18 NOTE — Progress Notes (Signed)
   12/18/20 1600  Psych Admission Type (Psych Patients Only)  Admission Status Involuntary  Psychosocial Assessment  Patient Complaints Anxiety  Eye Contact Fair  Facial Expression Flat  Affect Appropriate to circumstance  Speech Logical/coherent  Interaction Assertive  Motor Activity Other (Comment) (WDL)  Appearance/Hygiene Unremarkable  Behavior Characteristics Cooperative;Anxious  Mood Anxious  Thought Process  Coherency WDL  Content WDL  Delusions None reported or observed  Perception WDL  Hallucination None reported or observed  Judgment Poor  Confusion None  Danger to Self  Current suicidal ideation? Denies  Danger to Others  Danger to Others None reported or observed

## 2020-12-18 NOTE — Progress Notes (Signed)
Adult Psychoeducational Group Note  Date:  12/18/2020 Time:  4:49 PM  Group Topic/Focus:  Healthy Communication:   The focus of this group is to discuss communication, barriers to communication, as well as healthy ways to communicate with others.  Participation Level:  Did Not Attend Misty Young 12/18/2020, 4:49 PM
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
## 2020-12-18 NOTE — Progress Notes (Signed)
   12/17/20 2357  CIWA-Ar  BP 110/72  Pulse Rate 84  Nausea and Vomiting 0  Tactile Disturbances 0  Tremor 0  Auditory Disturbances 0  Paroxysmal Sweats 0  Visual Disturbances 0  Anxiety 2  Headache, Fullness in Head 0  Agitation 0  Orientation and Clouding of Sensorium 0  CIWA-Ar Total 2   Patient c/o anxiety and sleep disturbances at 2352, PRN vistaril 25 mg PO administered.   Support and encouragement provided as needed. Prn medication effective by 7948. Q 15 minutes safety checks ongoing without self harm gestures.

## 2020-12-18 NOTE — [facility] Group Notes (Signed)
Patient did not attend group   ADULT GRIEF GROUP NOTE:   Spiritual care group on grief and loss facilitated by chaplain Katy Jancarlos Thrun, [facility]   Group Goal:   Support / Education around grief and loss   Members engage in facilitated group support and psycho-social education.   Group Description:   Following introductions and group rules, group members engaged in facilitated group dialog and support around topic of loss, with particular support around experiences of loss in their lives. Group Identified types of loss (relationships / self / things) and identified patterns, circumstances, and changes that precipitate losses. Reflected on thoughts / feelings around loss, normalized grief responses, and recognized variety in grief experience. Group noted Worden's four tasks of grief in discussion.   Group drew on Adlerian / Rogerian, narrative, MI,    
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
## 2020-12-18 NOTE — Progress Notes (Addendum)
Pt presents calm and pleasant upon interaction. She denied SI/HI/AVH or self harm thoughts. Mostly reclusive to her room but out for meds and snacks. She is on CIWAR without scoring stating she feels better. She remains on high falls risk with no falls or unsafe behavior noted thus far. Q15 min observations maintained for safety and support provided as needed.    12/18/20 2300  Psych Admission Type (Psych Patients Only)  Admission Status Involuntary  Psychosocial Assessment  Patient Complaints None  Eye Contact Fair  Facial Expression Flat  Affect Sad  Speech Logical/coherent  Interaction Assertive  Motor Activity Other (Comment) (Unremarkable)  Appearance/Hygiene Unremarkable  Behavior Characteristics Cooperative;Calm  Mood Euthymic  Thought Process  Coherency WDL  Content WDL  Delusions None reported or observed  Perception WDL  Hallucination None reported or observed  Judgment Poor  Confusion None  Danger to Self  Current suicidal ideation? Denies  Danger to Others  Danger to Others None reported or observed

## 2020-12-18 NOTE — Progress Notes (Signed)
Recreation Therapy Notes  Animal-Assisted Activity (AAA) Program Checklist/Progress Notes Patient Eligibility Criteria Checklist & Daily Group note for Rec Tx Intervention  Date: 6.14.22 Time: 1430 Location: 300 Hall Dayroom   AAA/T Program Assumption of Risk Form signed by Patient/ or Parent Legal Guardian YES  Patient is free of allergies or severe asthma YES   Patient reports no fear of animals YES   Patient reports no history of cruelty to animals YES  Patient understands his/her participation is voluntary YES  Patient washes hands before animal contact YES  Patient washes hands after animal contact YES   Education: Hand Washing, Appropriate Animal Interaction   Education Outcome: Acknowledges understanding/In group clarification offered/Needs additional education.   Clinical Observations/Feedback: Pt did not attend activity.      Misty Young, LRT/CTRS        Misty Young 12/18/2020 3:40 PM 

## 2020-12-18 NOTE — Progress Notes (Signed)
Adult Psychoeducational Group Note  Date:  12/18/2020 Time:  2:41 PM  Group Topic/Focus:  Goals Group:   The focus of this group is to help patients establish daily goals to achieve during treatment and discuss how the patient can incorporate goal setting into their daily lives to aide in recovery.  Participation Level:  Did Not Attend Misty Young 12/18/2020, 2:41 PM

## 2020-12-19 DIAGNOSIS — F319 Bipolar disorder, unspecified: Principal | ICD-10-CM

## 2020-12-19 MED ORDER — HYDROXYZINE HCL 10 MG PO TABS
10.0000 mg | ORAL_TABLET | Freq: Two times a day (BID) | ORAL | Status: DC | PRN
  Administered 2020-12-19 – 2020-12-20 (×2): 10 mg via ORAL
  Filled 2020-12-19 (×2): qty 1

## 2020-12-19 MED ORDER — HYDROXYZINE HCL 10 MG PO TABS
10.0000 mg | ORAL_TABLET | Freq: Two times a day (BID) | ORAL | Status: DC
  Filled 2020-12-19 (×4): qty 1

## 2020-12-19 MED ORDER — OLANZAPINE 10 MG PO TABS
10.0000 mg | ORAL_TABLET | Freq: Every day | ORAL | Status: DC
  Administered 2020-12-19 – 2020-12-20 (×2): 10 mg via ORAL
  Filled 2020-12-19 (×5): qty 1

## 2020-12-19 MED ORDER — OLANZAPINE 5 MG PO TABS
5.0000 mg | ORAL_TABLET | Freq: Every day | ORAL | Status: DC
  Administered 2020-12-20 – 2020-12-21 (×2): 5 mg via ORAL
  Filled 2020-12-19 (×5): qty 1
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
## 2020-12-19 NOTE — Progress Notes (Signed)
D- Patient alert and oriented. Patient affect/mood reported as anxious. Denies SI, HI, AVH, and pain. PRN Vistaril given for c/o anxiety. Patient appears sad and remains in bed for the majority of the shift and is concerned about discharge status and location.   A- Scheduled medications administered to patient, per MD orders. Support and encouragement provided.  Routine safety checks conducted every 15 minutes.  Patient informed to notify staff with problems or concerns.  R- No adverse drug reactions noted. Patient contracts for safety at this time. Patient compliant with medications and treatment plan. Patient receptive, calm, and cooperative. Patient interacts well with others on the unit.  Patient remains safe at this time.             Colon NOVEL CORONAVIRUS (COVID-19) DAILY CHECK-OFF SYMPTOMS - answer yes or no to each - every day NO YES  Have you had a fever in the past 24 hours?  Fever (Temp > 37.80C / 100F) X    Have you had any of these symptoms in the past 24 hours? New Cough  Sore Throat   Shortness of Breath  Difficulty Breathing  Unexplained Body Aches   X    Have you had any one of these symptoms in the past 24 hours not related to allergies?   Runny Nose  Nasal Congestion  Sneezing   X    If you have had runny nose, nasal congestion, sneezing in the past 24 hours, has it worsened?   X    EXPOSURES - check yes or no X    Have you traveled outside the state in the past 14 days?   X    Have you been in contact with someone with a confirmed diagnosis of COVID-19 or PUI in the past 14 days without wearing appropriate PPE?   X    Have you been living in the same home as a person with confirmed diagnosis of COVID-19 or a PUI (household contact)?     X    Have you been diagnosed with COVID-19?     X                                                                                                                             What to do next: Answered NO to all:  Answered YES to anything:    Proceed with unit schedule Follow the [facility] Inpatient Flowsheet.
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
## 2020-12-19 NOTE — Progress Notes (Signed)
Psychoeducational Group Note  Date:  12/19/2020 Time:  2058  Group Topic/Focus:  Wrap-Up Group:   The focus of this group is to help patients review their daily goal of treatment and discuss progress on daily workbooks.  Participation Level: Did Not Attend  Participation Quality:  Not Applicable  Affect:  Not Applicable  Cognitive:  Not Applicable  Insight:  Not Applicable  Engagement in Group: Not Applicable  Additional Comments:  The patient did not attend group this evening.   Hazle Coca S 12/19/2020, 8:59 PM

## 2020-12-19 NOTE — Progress Notes (Signed)
Recreation Therapy Notes  Date: 6.15.22 Time: 0930 Location: 300 Hall Dayroom  Group Topic: Stress Management   Goal Area(s) Addresses:  Patient will actively participate in stress management techniques presented during session.  Patient will successfully identify benefit of practicing stress management post d/c.   Intervention: Guided exercise with ambient sound and script  Activity :Guided Imagery  LRT provided education, instruction, and demonstration on practice of visualization via guided imagery. Patient was asked to participate in the technique introduced during session. LRT also debriefed including topics of mindfulness, stress management and specific scenarios each patient could use these techniques. Patients were given suggestions of ways to access scripts post d/c and encouraged to explore Youtube and other apps available on smartphones, tablets, and computers.   Education:  Stress Management, Discharge Planning.   Education Outcome: Acknowledges education  Clinical Observations/Feedback: Patient did not attend group activity.     Chinita Schimpf, LRT/CTRS         Karlin Heilman A 12/19/2020 11:39 AM 

## 2020-12-19 NOTE — [facility] Counselor (Signed)
CSW faxed off paperwork and referrals for [facility] and [facility] treatment centers.  CSW will follow up with these centers to see if there will be any available beds within the next several days.

## 2020-12-19 NOTE — [facility] Counselor (Signed)
CSW spoke with the patient this morning about housing options and the resources that were provided to the patient yesterday.  The patient stated that she has not looked at her resources yet but will look at them today and will call some places to find shelters or alternative housing options.  CSW will speak with the patient again in the morning to see if she found any resources that are helpful.

## 2020-12-20 NOTE — [facility] Counselor (Signed)
CSW contact [facility] in [place] [phone] and was informed that the fax that was sent yesterday (12/19/20) was received.  The receptionist states that they are in the process of verifying the patient's insurance and will most likely have this completed before Friday evening.  The receptionist also states that there are currently no beds available and she is not sure when there will be an available bed.  The receptionist states that the patient will need to call daily once discharged to check on the bed availability.

## 2020-12-20 NOTE — Progress Notes (Signed)
Progress Note:    12/20/20 1000  Psych Admission Type (Psych Patients Only)  Admission Status Involuntary  Psychosocial Assessment  Patient Complaints Anxiety;Depression  Eye Contact Fair  Facial Expression Flat  Affect Sad  Speech Logical/coherent  Interaction Assertive  Motor Activity Other (Comment) (Unremarkable)  Appearance/Hygiene Unremarkable  Behavior Characteristics Anxious  Mood Depressed;Anxious  Thought Process  Coherency WDL  Content WDL  Delusions None reported or observed  Perception WDL  Hallucination None reported or observed  Judgment WDL  Confusion None  Danger to Self  Current suicidal ideation? Denies  Danger to Others  Danger to Others None reported or observed

## 2020-12-20 NOTE — Progress Notes (Signed)
[facility] Group Notes:  (Nursing/MHT/Case Management/Adjunct)  Date:  12/20/2020  Time:  2015  Type of Therapy:   wrap up group  Participation Level:  Active  Participation Quality:  Appropriate, Attentive, Sharing, and Supportive  Affect:  Appropriate  Cognitive:  Appropriate  Insight:  Improving  Engagement in Group:  Engaged  Modes of Intervention:  Clarification, Education, and Support  Summary of Progress/Problems: Positive thinking and positive change were discussed.   Marcille Buffy 12/20/2020, 10:34 PM

## 2020-12-20 NOTE — [facility] Counselor (Signed)
CSW spoke with the patient about available resources.  The patient stated that she understood that if there were no available beds then she would need a shelter and would need to call the treatment centers daily about an available bed.  The patient states that she is interested in having her information sent to [facility] and to [facility].  CSW will complete these referrals.

## 2020-12-20 NOTE — Progress Notes (Signed)
   12/20/20 0000  Psych Admission Type (Psych Patients Only)  Admission Status Involuntary  Psychosocial Assessment  Patient Complaints None  Eye Contact Fair  Facial Expression Flat  Affect Sad  Speech Logical/coherent  Interaction Assertive  Motor Activity Other (Comment) (Unremarkable)  Appearance/Hygiene Unremarkable  Behavior Characteristics Cooperative;Calm  Mood Pleasant  Thought Process  Coherency WDL  Content WDL  Delusions None reported or observed  Perception WDL  Hallucination None reported or observed  Judgment Poor  Confusion None  Danger to Self  Current suicidal ideation? Denies  Danger to Others  Danger to Others None reported or observed
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
## 2020-12-20 NOTE — [facility] Group Notes (Signed)
[facility] Group Notes: (Nursing/MHT/Case Management/Adjunct)   Date:  12/20/2020  Time:  9:00 AM   Type of Therapy:  Goals group   Participation Level:  Did Not Attend   Participation Quality:     Affect:     Cognitive:     Insight:     Engagement in Group:     Modes of Intervention:  Discussion and Education   Summary of Progress/Problems:  Did not attend despite staff encouragement.     Braylee Bosher V [place] Jun 12/20/2020 9:00 AM 

## 2020-12-21 MED ORDER — OLANZAPINE 10 MG PO TABS
10.0000 mg | ORAL_TABLET | Freq: Every day | ORAL | Status: DC
  Filled 2020-12-21: qty 1

## 2020-12-21 MED ORDER — OLANZAPINE 7.5 MG PO TABS
15.0000 mg | ORAL_TABLET | Freq: Every day | ORAL | Status: DC
  Filled 2020-12-21: qty 2

## 2020-12-21 MED ORDER — AMITRIPTYLINE HCL 50 MG PO TABS: 50.0000 mg | ORAL_TABLET | Freq: Every day | ORAL | 0 refills | Status: DC

## 2020-12-21 MED ORDER — PANTOPRAZOLE SODIUM 40 MG PO TBEC: 80.0000 mg | DELAYED_RELEASE_TABLET | Freq: Every day | ORAL | 0 refills | Status: DC

## 2020-12-21 MED ORDER — OLANZAPINE 10 MG PO TABS: 10.0000 mg | ORAL_TABLET | Freq: Every day | ORAL | 0 refills | Status: DC

## 2020-12-21 MED ORDER — HYDROXYZINE HCL 10 MG PO TABS: 10.0000 mg | ORAL_TABLET | Freq: Two times a day (BID) | ORAL | 0 refills | Status: DC | PRN

## 2020-12-21 MED ORDER — GABAPENTIN 300 MG PO CAPS: 300.0000 mg | ORAL_CAPSULE | Freq: Three times a day (TID) | ORAL | 0 refills | Status: DC

## 2020-12-21 NOTE — Progress Notes (Signed)
RN met with pt and reviewed pt's discharge instructions.  Pt verbalized understanding of discharge instructions and pt did not have any questions. RN reviewed and provided pt with a copy of SRA, AVS and Transition Record.  RN returned pt's belongings to pt.   Pt denied SI/HI/AVH and voiced no concerns.  Pt was appreciative of the care pt received at [facility].  Patient discharged to the lobby without incident.
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
## 2020-12-21 NOTE — Progress Notes (Signed)
  [facility] Adult Case Management Discharge Plan :  Will you be returning to the same living situation after discharge:  No, [facility]  At discharge, do you have transportation home?: Yes,  Friend  Do you have the ability to pay for your medications: Yes,  Medicare   Release of information consent forms completed and in the chart;  Patient's signature needed at discharge.  Patient to Follow up at:  Follow-up Information     [facility] Follow up.   Specialty: Addiction Medicine Why: A referral has been made for residential substance use treatment. Contact information: [address] [place] [address] [phone]         [facility] Go to.   Specialty: Professional Counselor Why: Please go to this provider for outpatient therapy and medication management services.  The walk in hours for new patients are Monday through Friday, 8:30 am to 12:00 pm and 1:00 pm to 2:30 pm. Contact information: [address] [place] [address] [phone]                 Next level of care provider has access to [facility] Link:no  Safety Planning and Suicide Prevention discussed: Yes,  with patient and mother   Have you used any form of tobacco in the last 30 days? (Cigarettes, Smokeless Tobacco, Cigars, and/or Pipes): Yes  Has patient been referred to the Quitline?: Patient refused referral  Patient has been referred for addiction treatment: Yes  Aram Beecham, LCSWA 12/21/2020, 12:41 PM

## 2020-12-21 NOTE — [facility] Group Notes (Signed)
[facility] Group Notes:  (Nursing/MHT/Case Management/Adjunct)   Date:  12/21/2020  Time:  11:55 AM   Type of Therapy:   Goals group   Participation Level:  Did Not Attend   Participation Quality:   Did not attend   Affect:       Cognitive:       Insight:     Engagement in Group:   Did not attend   Modes of Intervention:  Discussion and Education   Summary of Progress/Problems:   [name] V [name] 12/21/2020, 11:55 AM 

## 2020-12-21 NOTE — [facility] Suicide Risk Assessment (Signed)
[facility] Discharge Suicide Risk Assessment   Principal Problem: Bipolar 1 disorder (HCC) Discharge Diagnoses: Principal Problem:   Bipolar 1 disorder (HCC)   Total Time spent with patient: 15 minutes  Musculoskeletal: Strength & Muscle Tone: within normal limits Gait & Station: normal Patient leans: N/A  Psychiatric Specialty Exam  Presentation  General Appearance: Casual  Eye Contact:Fair  Speech:Clear and Coherent; Normal Rate  Speech Volume:Normal  Handedness:Right   Mood and Affect  Mood:Euthymic  Duration of Depression Symptoms: No data recorded Affect:Constricted   Thought Process  Thought Processes:Coherent; Goal Directed; Linear  Descriptions of Associations:Intact  Orientation:Full (Time, Place and Person)  Thought Content:Logical  History of Schizophrenia/Schizoaffective disorder:No  Duration of Psychotic Symptoms:Less than six months  Hallucinations:Hallucinations: None  Ideas of Reference:None  Suicidal Thoughts:Suicidal Thoughts: No  Homicidal Thoughts:Homicidal Thoughts: No   Sensorium  Memory:Immediate Fair; Recent Fair  Judgment:Fair  Insight:Shallow   Executive Functions  Concentration:Good  Attention Span:Good  Recall:Fair  Fund of Knowledge:Fair  Language:Good   Psychomotor Activity  Psychomotor Activity:Psychomotor Activity: Normal   Assets  Assets:Communication Skills; Desire for Improvement; Social Support; Housing   Sleep  Sleep:Sleep: Good Number of Hours of Sleep: 6   Physical Exam: Physical Exam Vitals and nursing note reviewed.  Constitutional:      General: She is not in acute distress.    Appearance: Normal appearance.  HENT:     Head: Normocephalic and atraumatic.  Pulmonary:     Effort: Pulmonary effort is normal.  Neurological:     General: No focal deficit present.     Mental Status: She is alert and oriented to person, place, and time.   Review of Systems  Constitutional: Negative.    Respiratory: Negative.    Cardiovascular: Negative.   Gastrointestinal: Negative.   Musculoskeletal: Negative.   Neurological: Negative.   Psychiatric/Behavioral:  Negative for depression, hallucinations and suicidal ideas. The patient is not nervous/anxious and does not have insomnia.    Blood pressure (!) 91/59, pulse 74, temperature 98.4 F (36.9 C), temperature source Oral, resp. rate 16, height 5\' 11"  (1.803 m), weight 107 kg, SpO2 96 %. Body mass index is 32.92 kg/m.  Mental Status Per Nursing Assessment::   On Admission:  NA  Demographic Factors:  Caucasian, Low socioeconomic status, and Unemployed  Loss Factors: Loss of significant relationship  Historical Factors: Impulsivity  Risk Reduction Factors:   Religious beliefs about death and Positive social support  Continued Clinical Symptoms:  Bipolar Disorder: improved, euthymic Alcohol/Substance Abuse/Dependencies Previous Psychiatric Diagnoses and Treatments  Cognitive Features That Contribute To Risk:  Thought constriction (tunnel vision)    Suicide Risk:  Minimal: No identifiable suicidal ideation.  Patients presenting with no risk factors but with morbid ruminations; may be classified as minimal risk based on the severity of the depressive symptoms   Follow-up Information     [facility] Follow up.   Specialty: Addiction Medicine Why: A referral has been made for residential substance use treatment. Contact information: [address] [place] [phone]         [facility] Go to.   Specialty: Professional Counselor Why: Please go to this provider for outpatient therapy and medication management services.  The walk in hours for new patients are Monday through Friday, 8:30 am to 12:00 pm and 1:00 pm to 2:30 pm. Contact information: [address] [place] [phone]  Plan Of  Care/Follow-up recommendations:  Activity: As tolerated  Tests: You periodically will need to have blood drawn for lab work to check cholesterol and blood sugar levels while taking olanzapine/Zyprexa.  Your outpatient doctor will let you know when lab work needs to be performed.  Other:  -Take medications as prescribed.   -Do not drink alcohol.   -Do not use marijuana/cannabis or other drugs.   -Attend residential substance abuse treatment program and 12-step groups.   -Keep outpatient mental health follow-up appointments with therapist and psychiatrist.  -See primary care provider for treatment of medical conditions.  Claudie Revering, MD 12/21/2020, 12:24 PM

## 2020-12-21 NOTE — Progress Notes (Signed)
Recreation Therapy Notes  Date:  6.17.22 Time: 0930 Location: [address] Dayroom  Group Topic: Stress Management  Goal Area(s) Addresses:  Patient will identify positive stress management techniques. Patient will identify benefits of using stress management post d/c.  Intervention: Stress Management  Activity :  Meditation.  LRT played on meditation that focused on looking at each day as a new opportunity to do something new and be productive.  Education:  Stress Management, Discharge Planning.   Education Outcome: Acknowledges Education  Clinical Observations/Feedback:  Pt did not attend group session.    Caroll Rancher, LRT/CTRS         Caroll Rancher A 12/21/2020 11:27 AM

## 2020-12-21 NOTE — [facility] Group Notes (Addendum)
[facility] Group Notes: (Nursing/MHT/Case Management/Adjunct)   Date:  12/21/2020  Time:  10:00 AM   Type of Therapy:  Group Therapy   Participation Level:  Did Not Attend   Participation Quality:     Affect:     Cognitive:     Insight:     Engagement in Group:     Modes of Intervention:  Activity and Rapport Building   Summary of Progress/Problems:  Did not attend despite staff encouragement.     Norm Parcel Isaia Hassell 6/116/2022 10:00 AM

## 2020-12-25 LAB — HEPATITIS B E ANTIBODY: Hep B e Ab: POSITIVE — AB

## 2020-12-25 LAB — ASPARTATE TRANSAMINASE: AST: 18 IU/L (ref 0–40)

## 2020-12-25 LAB — HEPATITIS B DNA, QUANTITATIVE BY PCR: Hep B DNA (IU/mL): NOT DETECTED IU/mL

## 2020-12-25 LAB — ALANINE TRANSAMINASE: Alanine transaminase: 18 IU/L (ref 0–32)

## 2020-12-25 LAB — HEPATITIS B E ANTIGEN: Hep B e Ag: POSITIVE — AB

## 2020-12-26 ENCOUNTER — Ambulatory Visit (HOSPITAL_COMMUNITY)
Admission: EM | Admit: 2020-12-26 | Discharge: 2020-12-26 | Disposition: A | Discharge status: Home / Self Care | Payer: Medicare Other | Attending: Licensed Clinical Social Worker | Admitting: Licensed Clinical Social Worker

## 2020-12-26 ENCOUNTER — Other Ambulatory Visit: Payer: Self-pay

## 2020-12-26 DIAGNOSIS — F319 Bipolar disorder, unspecified: Secondary | ICD-10-CM | POA: Diagnosis not present

## 2020-12-26 NOTE — ED Provider Notes (Signed)
Behavioral Health Urgent Care Medical Screening Exam  Patient Name: Misty Young MRN: [id number] Date of Evaluation: 12/26/20 Chief Complaint:   Diagnosis:  Final diagnoses:  Bipolar I disorder (HCC)    History of Present illness: Misty Young is a 45 y.o. female with a history of bipolar 1 disorder.  The patient reportedly arrived initially with a Field seismologist.  It was reported that she walked off prior to entering the building.  She is not under IVC.  Patient later presented to the front lobby.  On exam, patient is irritable and minimally participates in the assessment.  She states that she does not want to answer any questions.  She states "you are wasting your time."  She states that she is angry because the deputy that initially brought her told her that he was going to come back and pick her up and he has not come back.  When asked if he was supposed to be taking her somewhere she states none.  Denies suicidal ideations.  She denies homicidal ideations.  She denies auditory and visual hallucinations.  She does not appear to be responding to internal stimuli.  She denies paranoia.   Psychiatric Specialty Exam  Presentation  General Appearance:Casual; Fairly Groomed  Eye Contact:Fair  Speech:Clear and Coherent; Normal Rate  Speech Volume:Normal  Handedness:Right   Mood and Affect  Mood:Irritable  Affect:Congruent   Thought Process  Thought Processes:Coherent; Linear  Descriptions of Associations:Intact  Orientation:Full (Time, Place and Person)  Thought Content:Logical  Diagnosis of Schizophrenia or Schizoaffective disorder in past: No  Duration of Psychotic Symptoms: Less than six months  Hallucinations:None  Ideas of Reference:None  Suicidal Thoughts:No Without Intent; Without Plan  Homicidal Thoughts:No   Sensorium  Memory:Immediate Good; Recent Fair  Judgment:Fair  Insight:Fair   Executive Functions  Concentration:Fair  Attention  Span:Fair  Recall:Fair  Fund of Knowledge:Fair  Language:Good   Psychomotor Activity  Psychomotor Activity:Normal   Assets  Assets:Desire for Improvement; Social Support   Sleep  Sleep:Fair  Number of hours: 6   No data recorded  Physical Exam: Physical Exam Constitutional:      General: She is not in acute distress.    Appearance: She is not ill-appearing, toxic-appearing or diaphoretic.  HENT:     Head: Normocephalic.     Right Ear: External ear normal.     Left Ear: External ear normal.  Eyes:     Pupils: Pupils are equal, round, and reactive to light.  Cardiovascular:     Rate and Rhythm: Normal rate.  Pulmonary:     Effort: Pulmonary effort is normal. No respiratory distress.  Musculoskeletal:        General: Normal range of motion.  Neurological:     Mental Status: She is alert and oriented to person, place, and time.  Psychiatric:        Thought Content: Thought content is not paranoid or delusional. Thought content does not include homicidal or suicidal ideation.   Review of Systems  Respiratory:  Negative for cough and shortness of breath.   Cardiovascular:  Negative for chest pain.  Gastrointestinal:  Negative for diarrhea, nausea and vomiting.  Blood pressure (!) 147/87, pulse (!) 125, resp. rate 20, SpO2 98 %. There is no height or weight on file to calculate BMI.  Musculoskeletal: Strength & Muscle Tone: within normal limits Gait & Station: normal Patient leans: N/A   Suicide Risk:  Minimal: No identifiable suicidal ideation.  Patients presenting with no risk factors but with  morbid ruminations; may be classified as minimal risk based on the severity of the depressive symptoms   [facility] MSE Discharge Disposition for Follow up and Recommendations: Based on my evaluation the patient does not appear to have an emergency medical condition and can be discharged with resources and follow up care in outpatient services for Medication Management and  Individual Therapy  Patient was inpatient at [facility] from 12/14/2020 to 12/21/2020.  She was encouraged to follow up with [facility] and [place] family services as previously recommended.   Jackelyn Poling, NP 12/26/2020, 9:48 PM

## 2020-12-26 NOTE — BH Assessment (Signed)
Comprehensive Clinical Assessment (CCA) Screening, Triage and Referral Note  12/26/2020 Misty Young [id number]  DISPOSITION: Per Nira Conn, NP patient is psychiatrically cleared and able to be discharged.  PATIENT IS ROUTINE.   Chief Complaint: No chief complaint on file.  Visit Diagnosis:  Bipolar I disorder, by history  Patient Reported Information How did you hear about [place]? Self  What Is the Reason for Your Visit/Call Today? Patient stated she walked to [facility] and later stated that she was brought by [employer] who gave her a ride.Patient stated that she was angry becasue the police officer promiased her eh was coming back for her and he has not come back. Patient stated hurriedly when asked that she is not having SI, HI or AVH. She stated that she does not want to talk to anyone about anything else stating "y'all can look it up." Patient continually had her head down on the table and was fidgety and irritable.  How Long Has This Been Causing You Problems? 1-6 months  What Do You Feel Would Help You the Most Today? -- (UTA)   Have You Recently Had Any Thoughts About Hurting Yourself? No  Are You Planning to Commit Suicide/Harm Yourself At This time? No   Have you Recently Had Thoughts About Hurting Someone [name]? No  Are You Planning to Harm Someone at This Time? No  Explanation: No data recorded  Have You Used Any Alcohol or Drugs in the Past 24 Hours? -- (UTA)  How Long Ago Did You Use Drugs or Alcohol? No data recorded What Did You Use and How Much? Meth and THC   Do You Currently Have a Therapist/Psychiatrist? Yes  Name of Therapist/Psychiatrist: Per hx, Dr. Sharol Harness   Have You Been Recently Discharged From Any Office Practice or Programs? -- (UTA)  Explanation of Discharge From Practice/Program: No data recorded   CCA Screening Triage Referral Assessment Type of Contact: Face-to-Face  Telemedicine Service Delivery:   Is this Initial or Reassessment? No  data recorded Date Telepsych consult ordered in [facility]:  No data recorded Time Telepsych consult ordered in [facility]:  No data recorded Location of Assessment: [facility]  Provider Location: [facility] Services   Collateral Involvement: none   Does Patient Have a Automotive engineer Guardian? No data recorded Name and Contact of Legal Guardian: No data recorded If Minor and Not Living with Parent(s), Who has Custody? No data recorded Is CPS involved or ever been involved? -- (UTA)  Is APS involved or ever been involved? -- (UTA)   Patient Determined To Be At Risk for Harm To Self or Others Based on Review of Patient Reported Information or Presenting Complaint? Yes, for Self-Harm  Method: No data recorded Availability of Means: No data recorded Intent: No data recorded Notification Required: No data recorded Additional Information for Danger to Others Potential: No data recorded Additional Comments for Danger to Others Potential: No data recorded Are There Guns or Other Weapons in Your Home? No data recorded Types of Guns/Weapons: No data recorded Are These Weapons Safely Secured?                            No data recorded Who Could Verify You Are Able To Have These Secured: No data recorded Do You Have any Outstanding Charges, Pending Court Dates, Parole/Probation? No data recorded Contacted To Inform of Risk of Harm To Self or Others: No data recorded  Does  Patient Present under Involuntary Commitment? No  IVC Papers Initial File Date: No data recorded  [place] of Residence: [place]   Patient Currently Receiving the Following Services: Medication Management   Determination of Need: Routine (7 days)   Options For Referral: Outpatient Therapy; Medication Management   Discharge Disposition:     Carolanne Grumbling, Counselor  Irbin Fines T. Jimmye Norman, MS, [facility] Triage Specialist [facility]

## 2020-12-26 NOTE — Discharge Instructions (Signed)

## 2020-12-27 ENCOUNTER — Emergency Department (HOSPITAL_COMMUNITY)
Admission: EM | Admit: 2020-12-27 | Discharge: 2020-12-27 | Disposition: A | Discharge status: Other Acute Inpatient Hospital | Payer: Medicare Other | Attending: Emergency Medicine | Admitting: Emergency Medicine

## 2020-12-27 ENCOUNTER — Emergency Department (HOSPITAL_COMMUNITY): Payer: Medicare Other

## 2020-12-27 ENCOUNTER — Ambulatory Visit (INDEPENDENT_AMBULATORY_CARE_PROVIDER_SITE_OTHER)
Admission: EM | Admit: 2020-12-27 | Discharge: 2020-12-27 | Disposition: A | Discharge status: Home / Self Care | Payer: Medicare Other | Source: Home / Self Care

## 2020-12-27 ENCOUNTER — Encounter (HOSPITAL_COMMUNITY): Payer: Self-pay | Admitting: *Deleted

## 2020-12-27 ENCOUNTER — Other Ambulatory Visit: Payer: Self-pay

## 2020-12-27 DIAGNOSIS — Z20822 Contact with and (suspected) exposure to covid-19: Secondary | ICD-10-CM | POA: Insufficient documentation

## 2020-12-27 DIAGNOSIS — R52 Pain, unspecified: Secondary | ICD-10-CM

## 2020-12-27 DIAGNOSIS — F411 Generalized anxiety disorder: Secondary | ICD-10-CM | POA: Insufficient documentation

## 2020-12-27 DIAGNOSIS — F909 Attention-deficit hyperactivity disorder, unspecified type: Secondary | ICD-10-CM | POA: Insufficient documentation

## 2020-12-27 DIAGNOSIS — F1190 Opioid use, unspecified, uncomplicated: Secondary | ICD-10-CM | POA: Diagnosis not present

## 2020-12-27 DIAGNOSIS — Y900 Blood alcohol level of less than 20 mg/100 ml: Secondary | ICD-10-CM | POA: Insufficient documentation

## 2020-12-27 DIAGNOSIS — Z5900 Homelessness unspecified: Secondary | ICD-10-CM | POA: Insufficient documentation

## 2020-12-27 DIAGNOSIS — F1510 Other stimulant abuse, uncomplicated: Secondary | ICD-10-CM | POA: Insufficient documentation

## 2020-12-27 DIAGNOSIS — X58XXXA Exposure to other specified factors, initial encounter: Secondary | ICD-10-CM | POA: Insufficient documentation

## 2020-12-27 DIAGNOSIS — F1290 Cannabis use, unspecified, uncomplicated: Secondary | ICD-10-CM | POA: Insufficient documentation

## 2020-12-27 DIAGNOSIS — F319 Bipolar disorder, unspecified: Secondary | ICD-10-CM | POA: Diagnosis not present

## 2020-12-27 DIAGNOSIS — M79671 Pain in right foot: Secondary | ICD-10-CM | POA: Insufficient documentation

## 2020-12-27 DIAGNOSIS — F17210 Nicotine dependence, cigarettes, uncomplicated: Secondary | ICD-10-CM | POA: Diagnosis not present

## 2020-12-27 DIAGNOSIS — F312 Bipolar disorder, current episode manic severe with psychotic features: Secondary | ICD-10-CM | POA: Diagnosis not present

## 2020-12-27 DIAGNOSIS — Z79899 Other long term (current) drug therapy: Secondary | ICD-10-CM | POA: Diagnosis not present

## 2020-12-27 DIAGNOSIS — M25562 Pain in left knee: Secondary | ICD-10-CM | POA: Insufficient documentation

## 2020-12-27 DIAGNOSIS — F1910 Other psychoactive substance abuse, uncomplicated: Secondary | ICD-10-CM

## 2020-12-27 DIAGNOSIS — Y92410 Unspecified street and highway as the place of occurrence of the external cause: Secondary | ICD-10-CM | POA: Insufficient documentation

## 2020-12-27 LAB — CBC WITH DIFFERENTIAL/PLATELET
Abs Immature Granulocytes: 0.05 10*3/uL (ref 0.00–0.07)
Basophils Absolute: 0.1 10*3/uL (ref 0.0–0.1)
Basophils Relative: 1 %
Eosinophils Absolute: 0.6 10*3/uL — ABNORMAL HIGH (ref 0.0–0.5)
Eosinophils Relative: 6 %
HCT: 34.3 % — ABNORMAL LOW (ref 36.0–46.0)
Hemoglobin: 11.2 g/dL — ABNORMAL LOW (ref 12.0–15.0)
Immature Granulocytes: 1 %
Lymphocytes Relative: 15 %
Lymphs Abs: 1.6 10*3/uL (ref 0.7–4.0)
MCH: 29.5 pg (ref 26.0–34.0)
MCHC: 32.7 g/dL (ref 30.0–36.0)
MCV: 90.3 fL (ref 80.0–100.0)
Monocytes Absolute: 0.7 10*3/uL (ref 0.1–1.0)
Monocytes Relative: 7 %
Neutro Abs: 7.6 10*3/uL (ref 1.7–7.7)
Neutrophils Relative %: 70 %
Platelets: 258 10*3/uL (ref 150–400)
RBC: 3.8 MIL/uL — ABNORMAL LOW (ref 3.87–5.11)
RDW: 14.8 % (ref 11.5–15.5)
WBC: 10.7 10*3/uL — ABNORMAL HIGH (ref 4.0–10.5)
nRBC: 0 % (ref 0.0–0.2)

## 2020-12-27 LAB — RESP PANEL BY RT-PCR (FLU A&B, COVID) ARPGX2
Influenza A by PCR: NEGATIVE
Influenza B by PCR: NEGATIVE
SARS Coronavirus 2 by RT PCR: NEGATIVE

## 2020-12-27 LAB — COMPREHENSIVE METABOLIC PANEL
ALT: 39 U/L (ref 0–44)
AST: 24 U/L (ref 15–41)
Albumin: 4.1 g/dL (ref 3.5–5.0)
Alkaline Phosphatase: 74 U/L (ref 38–126)
Anion gap: 10 (ref 5–15)
BUN: 22 mg/dL — ABNORMAL HIGH (ref 6–20)
CO2: 23 mmol/L (ref 22–32)
Calcium: 8.9 mg/dL (ref 8.9–10.3)
Chloride: 106 mmol/L (ref 98–111)
Creatinine, Ser: 0.95 mg/dL (ref 0.44–1.00)
GFR, Estimated: >60 mL/min (ref >60)
Glucose, Bld: 81 mg/dL (ref 70–99)
Potassium: 3.2 mmol/L — ABNORMAL LOW (ref 3.5–5.1)
Sodium: 139 mmol/L (ref 135–145)
Total Bilirubin: 1.2 mg/dL (ref 0.3–1.2)
Total Protein: 7 g/dL (ref 6.5–8.1)

## 2020-12-27 LAB — RAPID URINE DRUG SCREEN, HOSP PERFORMED
Amphetamines: POSITIVE — AB
Barbiturates: NOT DETECTED
Benzodiazepines: NOT DETECTED
Cocaine: POSITIVE — AB
Opiates: NOT DETECTED
Tetrahydrocannabinol: POSITIVE — AB

## 2020-12-27 LAB — I-STAT BETA HCG BLOOD, ED (MC, WL, AP ONLY): I-stat hCG, quantitative: <5 m[IU]/mL (ref <5)

## 2020-12-27 LAB — ETHANOL: Alcohol, Ethyl (B): <10 mg/dL (ref <10)

## 2020-12-27 MED ORDER — GABAPENTIN 300 MG PO CAPS: 600.0000 mg | ORAL_CAPSULE | Freq: Three times a day (TID) | ORAL | Status: DC | PRN

## 2020-12-27 MED ORDER — PANTOPRAZOLE SODIUM 40 MG PO TBEC: 40.0000 mg | DELAYED_RELEASE_TABLET | Freq: Two times a day (BID) | ORAL | Status: DC

## 2020-12-27 MED ORDER — AMITRIPTYLINE HCL 25 MG PO TABS
50.0000 mg | ORAL_TABLET | Freq: Every day | ORAL | Status: DC
  Administered 2020-12-27: 50 mg via ORAL
  Filled 2020-12-27: qty 2

## 2020-12-27 MED ORDER — KETOROLAC TROMETHAMINE 30 MG/ML IJ SOLN
30.0000 mg | Freq: Once | INTRAMUSCULAR | Status: AC
Start: 2020-12-27 — End: 2020-12-27
  Administered 2020-12-27: 30 mg via INTRAMUSCULAR
  Filled 2020-12-27: qty 1

## 2020-12-27 MED ORDER — PANTOPRAZOLE SODIUM 40 MG PO TBEC
80.0000 mg | DELAYED_RELEASE_TABLET | Freq: Every day | ORAL | Status: DC
  Administered 2020-12-27: 80 mg via ORAL
  Filled 2020-12-27: qty 2

## 2020-12-27 MED ORDER — HYDROXYZINE HCL 10 MG PO TABS: 10.0000 mg | ORAL_TABLET | Freq: Two times a day (BID) | ORAL | Status: DC | PRN

## 2020-12-27 MED ORDER — GABAPENTIN 300 MG PO CAPS
300.0000 mg | ORAL_CAPSULE | Freq: Three times a day (TID) | ORAL | Status: DC
  Administered 2020-12-27: 300 mg via ORAL
  Filled 2020-12-27: qty 1

## 2020-12-27 MED ORDER — LORAZEPAM 1 MG PO TABS
2.0000 mg | ORAL_TABLET | Freq: Once | ORAL | Status: AC
  Administered 2020-12-27: 2 mg via ORAL
  Filled 2020-12-27: qty 2

## 2020-12-27 MED ORDER — OLANZAPINE 10 MG PO TABS: 10.0000 mg | ORAL_TABLET | Freq: Every day | ORAL | Status: DC

## 2020-12-27 MED ORDER — HYDROXYZINE HCL 25 MG PO TABS
25.0000 mg | ORAL_TABLET | Freq: Once | ORAL | Status: DC
  Filled 2020-12-27: qty 1

## 2020-12-27 NOTE — ED Provider Notes (Addendum)
Behavioral Health Urgent Care Medical Screening Exam  Patient Name: Misty Young MRN: [id number] Date of Evaluation: 12/27/20 Chief Complaint:   Diagnosis:  Final diagnoses:  None    History of Present illness: Misty Young is a 45 y.o. female with a history of Bipolar Disorder who [facility] for TTS assessment. Patient refused to enter building. Met with patient outside of back [facility]. Patient denies SI, HI, AVH. Denies chest pain, shortness of breath. No acute distress noted. Refused vital signs. Patient is demanding that she be admitted to [facility]. Discussed that the patient would need a full assessment before any recommendations could be made. Patient continued to refuse to enter building. Sheriff Deputy reports that the patient walked off after I reentered the building.   Psychiatric Specialty Exam  Presentation  General Appearance:Fairly Groomed  Eye Contact:Good  Speech:Clear and Coherent  Speech Volume:Increased  Handedness:Right   Mood and Affect  Mood:Irritable  Affect:Congruent   Thought Process  Thought Processes:Coherent; Linear  Descriptions of Associations:Intact  Orientation:Full (Time, Place and Person)  Thought Content:Perseveration  Diagnosis of Schizophrenia or Schizoaffective disorder in past: No  Duration of Psychotic Symptoms: Less than six months  Hallucinations:None  Ideas of Reference:None  Suicidal Thoughts:No Without Intent; Without Plan  Homicidal Thoughts:No   Sensorium  Memory:Immediate Good  Judgment:Intact  Insight:Present   Executive Functions  Concentration:Fair  Attention Span:Fair  [place]  Language:Good   Psychomotor Activity  Psychomotor Activity:Restlessness   Assets  Assets:Desire for Improvement; Physical Health   Sleep  Sleep:Fair  Number of hours: 6   No data recorded  Physical Exam: Physical Exam Constitutional:       General: She is not in acute distress.    Appearance: She is not ill-appearing or toxic-appearing.  Pulmonary:     Effort: Pulmonary effort is normal. No respiratory distress.  Musculoskeletal:        General: Normal range of motion.  Neurological:     Mental Status: She is alert and oriented to person, place, and time.  Psychiatric:        Thought Content: Thought content is not paranoid. Thought content does not include homicidal or suicidal ideation.   Review of Systems  Respiratory:  Negative for cough and shortness of breath.   Cardiovascular:  Negative for chest pain.  Psychiatric/Behavioral:  Positive for depression. Negative for hallucinations and suicidal ideas. The patient is nervous/anxious and has insomnia.   There were no vitals taken for this visit. There is no height or weight on file to calculate BMI.  Musculoskeletal: Strength & Muscle Tone: within normal limits Gait & Station: normal Patient leans: N/A   [facility] MSE Discharge Disposition for Follow up and Recommendations: Based on my evaluation the patient does not appear to have an emergency medical condition and can be discharged with resources and follow up care in outpatient services for Medication Management and Individual Therapy  Patient left premises prior to receiving AVS.   Rozetta Nunnery, NP 12/27/2020, 9:36 PM

## 2020-12-27 NOTE — ED Provider Notes (Signed)
[facility]-EMERGENCY DEPT Provider Note   CSN: [id number] Arrival date & time: 12/27/20  0703     History Chief Complaint  Patient presents with   Psychiatric Evaluation    Misty Young is a 45 y.o. female.  Misty Young states that she wants to be back on Jordan and Zyprexa.  Apparently, she left her medication at a homeless shelter about a week ago and has been untreated since then.  She states that she called police and feigned suicidal ideation so that she will be brought in for evaluation.  She would like to be admitted to [facility] behavioral health.  She was actually seen at behavioral health urgent care yesterday, and they deemed her to be not having a psychiatric emergency.  However, she states that she would like to be voluntarily committed.  She denies any suicidal ideation.  Complicating her presentation is the fact that she is currently homeless.  She has been walking for days, and she complains of pain at the dorsal lateral aspect of her right foot.  @1534  I was called to see this patient.  She was complaining of knee pain and wanted a cortisone injection.  She has had 1 in the past but cannot recall the exact date.  She states that her walking has exacerbated her underlying knee arthritis.  I agreed to order an x-ray.  The history is provided by the patient.  Mental Health Problem Presenting symptoms comment:  Wants her medication adjusted Degree of incapacity (severity):  Moderate Onset quality:  Gradual Duration: chronic. Timing:  Constant Progression:  Worsening Chronicity:  Recurrent Context: drug abuse and noncompliance   Treatment compliance:  Untreated Time since last psychoactive medication taken:  1 week Relieved by:  Nothing Worsened by:  Nothing Ineffective treatments:  Mood stabilizers Associated symptoms: no abdominal pain, no appetite change, no chest pain, no hypersomnia and no insomnia       Past Medical History:   Diagnosis Date   Attention deficit disorder    Bipolar 1 disorder (HCC)    Hemorrhoids    uses Preparation H wipes and cream   Kidney stones    Opiate use     Patient Active Problem List   Diagnosis Date Noted   Bipolar 1 disorder (HCC) 12/14/2020   Overdose 02/29/2016   Acute encephalopathy 02/29/2016   ADHD (attention deficit hyperactivity disorder) 02/29/2016   Ambien accidental overdose 02/29/2016   E. coli UTI 03/28/2015   Generalized anxiety disorder 03/28/2015   Pyelonephritis 03/27/2015   Smoking trying to quit 03/27/2015   Hypokalemia 03/27/2015   Abnormal LFTs 06/12/2014   Abnormal liver CT 06/12/2014   Rectal bleeding 06/12/2014   Abdominal swelling 06/12/2014   Constipation 06/12/2014   Hemorrhoids 06/12/2014   Umbilical hernia 05/20/2012    Past Surgical History:  Procedure Laterality Date   CESAREAN SECTION  2002/2004/2009/2013   x4   INSERTION OF MESH  06/15/2012   Procedure: INSERTION OF MESH;  Surgeon: 14/04/2012, MD;  Location: [facility] ORS;  Service: General;  Laterality: N/A;   LITHOTRIPSY     THERAPEUTIC ABORTION     UMBILICAL HERNIA REPAIR  06/15/2012   Procedure: HERNIA REPAIR UMBILICAL ADULT;  Surgeon: 14/04/2012, MD;  Location: [facility] ORS;  Service: General;  Laterality: N/A;     OB History     Gravida  6   Para  4   Term  4   Preterm      AB  2   Living  4      SAB  1   IAB  1   Ectopic      Multiple      Live Births  1           Family History  Problem Relation Age of Onset   Diabetes Maternal Grandfather    Colon cancer Neg Hx     Social History   Tobacco Use   Smoking status: Every Day    Packs/day: 0.50    Years: 15.00    Pack years: 7.50    Types: Cigarettes   Smokeless tobacco: Never  Substance Use Topics   Alcohol use: Not Currently    Alcohol/week: 0.0 standard drinks    Comment: Denies ETOH (10/13/18); previously: occasionally   Drug use: Not Currently    Types: Methamphetamines     Comment: occ marijuana    Home Medications Prior to Admission medications   Medication Sig Start Date End Date Taking? Authorizing Provider  amitriptyline (ELAVIL) 50 MG tablet Take 1 tablet (50 mg total) by mouth at bedtime. For depression/sleep 12/21/20   Armandina StammerNwoko, Agnes I, NP  gabapentin (NEURONTIN) 300 MG capsule Take 1 capsule (300 mg total) by mouth 3 (three) times daily. For agitation 12/21/20   Armandina StammerNwoko, Agnes I, NP  hydrOXYzine (ATARAX/VISTARIL) 10 MG tablet Take 1 tablet (10 mg total) by mouth 2 (two) times daily as needed for anxiety. 12/21/20   Armandina StammerNwoko, Agnes I, NP  OLANZapine (ZYPREXA) 10 MG tablet Take 1 tablet (10 mg total) by mouth at bedtime. For mood control 12/21/20   Armandina StammerNwoko, Agnes I, NP  pantoprazole (PROTONIX) 40 MG tablet Take 2 tablets (80 mg total) by mouth daily. For acid reflux 12/22/20   Armandina StammerNwoko, Agnes I, NP  lamoTRIgine (LAMICTAL) 100 MG tablet Take 25-100 mg by mouth See admin instructions. Take 25 mg at bedtime for 2 weeks, then 50 mg at bedtime for 2 weeks, then 100 mg at bedtime  12/13/20  [provider]    Allergies    Tramadol hcl and Penicillins  Review of Systems   Review of Systems  Constitutional:  Negative for appetite change, chills and fever.  HENT:  Negative for ear pain and sore throat.   Eyes:  Negative for pain and visual disturbance.  Respiratory:  Negative for cough and shortness of breath.   Cardiovascular:  Negative for chest pain and palpitations.  Gastrointestinal:  Negative for abdominal pain and vomiting.  Genitourinary:  Negative for dysuria and hematuria.  Musculoskeletal:  Negative for arthralgias and back pain.  Skin:  Negative for color change and rash.  Neurological:  Negative for seizures and syncope.  Psychiatric/Behavioral:  Negative for sleep disturbance. The patient does not have insomnia.        She did grab her arms tightly to make it look like she was going to harm herself  All other systems reviewed and are  negative.  Physical Exam Updated Vital Signs BP 100/61 (BP Location: Left Arm)   Pulse 81   Temp 98.8 F (37.1 C) (Oral)   Resp 17   SpO2 99%   Physical Exam Vitals and nursing note reviewed.  HENT:     Head: Normocephalic and atraumatic.  Eyes:     General: No scleral icterus. Pulmonary:     Effort: Pulmonary effort is normal. No respiratory distress.  Musculoskeletal:     Cervical back: Normal range of motion.     Comments: Swelling and mild erythema  at the dorsal lateral aspect of the right foot over the cuboid bone.  Distal pulses are normal.  Sensation is normal.  There is tenderness to palpation over the affected area.  Left knee is normal to inspection without effusion.  Range of motion is full but mildly painful at both the medial and lateral joint lines.  There is tenderness to palpation especially at the medial joint line.  Skin:    General: Skin is warm and dry.     Comments: Gutter bruising over the upper arm of the left greater than the right limb  Neurological:     General: No focal deficit present.     Mental Status: She is alert and oriented to person, place, and time.  Psychiatric:        Attention and Perception: Attention and perception normal.        Mood and Affect: Affect is labile and angry.        Speech: Speech normal.        Behavior: Behavior is aggressive.        Thought Content: Thought content normal.        Cognition and Memory: Cognition and memory normal.        Judgment: Judgment is impulsive.    ED Results / Procedures / Treatments   Labs (all labs ordered are listed, but only abnormal results are displayed) Labs Reviewed  COMPREHENSIVE METABOLIC PANEL - Abnormal; Notable for the following components:      Result Value   Potassium 3.2 (*)    BUN 22 (*)    All other components within normal limits  RAPID URINE DRUG SCREEN, HOSP PERFORMED - Abnormal; Notable for the following components:   Cocaine POSITIVE (*)    Amphetamines  POSITIVE (*)    Tetrahydrocannabinol POSITIVE (*)    All other components within normal limits  CBC WITH DIFFERENTIAL/PLATELET - Abnormal; Notable for the following components:   WBC 10.7 (*)    RBC 3.80 (*)    Hemoglobin 11.2 (*)    HCT 34.3 (*)    Eosinophils Absolute 0.6 (*)    All other components within normal limits  RESP PANEL BY RT-PCR (FLU A&B, COVID) ARPGX2  ETHANOL  I-STAT BETA HCG BLOOD, ED ([facility], [facility], [facility] ONLY)    EKG EKG Interpretation  Date/Time:  Thursday December 27 2020 09:18:23 EDT Ventricular Rate:  72 PR Interval:  162 QRS Duration: 90 QT Interval:  464 QTC Calculation: 508 R Axis:   72 Text Interpretation: Normal sinus rhythm Prolonged QT Abnormal ECG no acute ischemia Confirmed by Pieter Partridge ([id number]) on 12/27/2020 9:30:03 AM  Radiology DG Foot Complete Right  Result Date: 12/27/2020 CLINICAL DATA:  Right foot pain.  No known injury. EXAM: RIGHT FOOT COMPLETE - 3+ VIEW COMPARISON:  06/19/2015. FINDINGS: Degenerative changes first MTP joint. Tiny bony density noted posterior to the navicula, this could represent a tiny fracture fragment, possibly old. No focal bony abnormality otherwise noted. No radiopaque foreign body. IMPRESSION: Degenerative changes first MTP joint. Tiny bony density noted posterior to the navicular, this could represent a tiny fracture fragment, possibly old. No focal bony abnormality otherwise noted. Electronically Signed   By: Maisie Fus  Register   On: 12/27/2020 09:59    Procedures Procedures   Medications Ordered in ED Medications  hydrOXYzine (ATARAX/VISTARIL) tablet 25 mg (25 mg Oral Patient Refused/Not Given 12/27/20 1505)  ketorolac (TORADOL) 30 MG/ML injection 30 mg (30 mg Intramuscular Given 12/27/20 0919)  LORazepam (ATIVAN) tablet 2  mg (2 mg Oral Given 12/27/20 1309)    ED Course  I have reviewed the triage vital signs and the nursing notes.  Pertinent labs & imaging results that were available during my care of the patient were  reviewed by me and considered in my medical decision making (see chart for details).    MDM Rules/Calculators/A&P                          Kandis Ban presented to the ED asking for psychiatric evaluation.  No indication of an acute mental health emergency, and she had just been assessed behavioral health urgent care.  She also complained of some musculoskeletal pain from some walking.  Apparently she is currently homeless.  UDS was positive for multiple substances.  She is awaiting [facility] consultation. Final Clinical Impression(s) / ED Diagnoses Final diagnoses:  Polysubstance abuse (HCC)  Right foot pain  Acute pain of left knee    Rx / DC Orders ED Discharge Orders     None        Koleen Distance, MD 12/27/20 1537
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
## 2020-12-27 NOTE — ED Notes (Signed)
Patient transported to X-ray 

## 2020-12-27 NOTE — ED Triage Notes (Signed)
Pt says she wants to voluntarily commit herself to go across the street. No SI/HI. She wants to get back on some psychiatric medications.

## 2020-12-27 NOTE — ED Notes (Signed)
Patient asking for more Ativan, none is ordered.  Freida Busman, EDP made aware and is not going to order any.  Offered patient the Atarax that was ordered earlier and she still refused.

## 2020-12-27 NOTE — ED Notes (Signed)
ED Provider at bedside. 

## 2020-12-27 NOTE — BH Assessment (Signed)
Per Doran Heater, NP -Please send to [facility], we can reassess upon arrival
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
## 2020-12-27 NOTE — ED Provider Notes (Signed)
Discussed with behavioral health and they recommend overnight admission for observation.  We will try to take patient at the [facility]   Lorre Nick, MD 12/27/20 1718

## 2020-12-29 ENCOUNTER — Ambulatory Visit (HOSPITAL_COMMUNITY)
Admission: EM | Admit: 2020-12-29 | Discharge: 2020-12-29 | Disposition: A | Discharge status: Home / Self Care | Payer: Medicare Other | Attending: Urology | Admitting: Urology

## 2020-12-29 ENCOUNTER — Ambulatory Visit (INDEPENDENT_AMBULATORY_CARE_PROVIDER_SITE_OTHER)
Admission: EM | Admit: 2020-12-29 | Discharge: 2020-12-30 | Disposition: A | Discharge status: Home / Self Care | Payer: Medicare Other | Source: Home / Self Care

## 2020-12-29 ENCOUNTER — Other Ambulatory Visit: Payer: Self-pay

## 2020-12-29 DIAGNOSIS — F3112 Bipolar disorder, current episode manic without psychotic features, moderate: Secondary | ICD-10-CM | POA: Insufficient documentation

## 2020-12-29 DIAGNOSIS — F22 Delusional disorders: Secondary | ICD-10-CM | POA: Insufficient documentation

## 2020-12-29 DIAGNOSIS — Z79899 Other long term (current) drug therapy: Secondary | ICD-10-CM | POA: Diagnosis not present

## 2020-12-29 DIAGNOSIS — Z560 Unemployment, unspecified: Secondary | ICD-10-CM | POA: Insufficient documentation

## 2020-12-29 DIAGNOSIS — Z9114 Patient's other noncompliance with medication regimen: Secondary | ICD-10-CM | POA: Insufficient documentation

## 2020-12-29 DIAGNOSIS — Z8659 Personal history of other mental and behavioral disorders: Secondary | ICD-10-CM | POA: Diagnosis not present

## 2020-12-29 DIAGNOSIS — Z5901 Sheltered homelessness: Secondary | ICD-10-CM | POA: Insufficient documentation

## 2020-12-29 DIAGNOSIS — F1994 Other psychoactive substance use, unspecified with psychoactive substance-induced mood disorder: Secondary | ICD-10-CM

## 2020-12-29 DIAGNOSIS — F17210 Nicotine dependence, cigarettes, uncomplicated: Secondary | ICD-10-CM | POA: Insufficient documentation

## 2020-12-29 DIAGNOSIS — Z5900 Homelessness unspecified: Secondary | ICD-10-CM | POA: Insufficient documentation

## 2020-12-29 DIAGNOSIS — F1490 Cocaine use, unspecified, uncomplicated: Secondary | ICD-10-CM | POA: Diagnosis not present

## 2020-12-29 DIAGNOSIS — F3110 Bipolar disorder, current episode manic without psychotic features, unspecified: Secondary | ICD-10-CM | POA: Diagnosis not present

## 2020-12-29 DIAGNOSIS — Z20822 Contact with and (suspected) exposure to covid-19: Secondary | ICD-10-CM | POA: Insufficient documentation

## 2020-12-29 DIAGNOSIS — F319 Bipolar disorder, unspecified: Secondary | ICD-10-CM | POA: Insufficient documentation

## 2020-12-29 LAB — POCT URINE DRUG SCREEN - MANUAL ENTRY (I-SCREEN)
POC Amphetamine UR: POSITIVE — AB
POC Buprenorphine (BUP): NOT DETECTED
POC Cocaine UR: POSITIVE — AB
POC Marijuana UR: POSITIVE — AB
POC Methadone UR: NOT DETECTED
POC Methamphetamine UR: POSITIVE — AB
POC Morphine: NOT DETECTED
POC Oxazepam (BZO): POSITIVE — AB
POC Oxycodone UR: NOT DETECTED
POC Secobarbital (BAR): NOT DETECTED

## 2020-12-29 LAB — POC SARS CORONAVIRUS 2 AG: SARSCOV2ONAVIRUS 2 AG: NEGATIVE

## 2020-12-29 MED ORDER — LORAZEPAM 1 MG PO TABS
ORAL_TABLET | ORAL | Status: AC
  Filled 2020-12-29: qty 2

## 2020-12-29 MED ORDER — LURASIDONE HCL 120 MG PO TABS: 120.0000 mg | ORAL_TABLET | Freq: Every day | ORAL | Status: DC

## 2020-12-29 MED ORDER — GABAPENTIN 300 MG PO CAPS
300.0000 mg | ORAL_CAPSULE | Freq: Three times a day (TID) | ORAL | Status: DC
  Administered 2020-12-29: 300 mg via ORAL
  Filled 2020-12-29: qty 1

## 2020-12-29 MED ORDER — TRANEXAMIC ACID 650 MG PO TABS
650.0000 mg | ORAL_TABLET | Freq: Three times a day (TID) | ORAL | Status: DC | PRN
  Filled 2020-12-29: qty 1

## 2020-12-29 MED ORDER — AMITRIPTYLINE HCL 25 MG PO TABS: 50.0000 mg | ORAL_TABLET | Freq: Every day | ORAL | Status: DC

## 2020-12-29 MED ORDER — PANTOPRAZOLE SODIUM 40 MG PO TBEC
80.0000 mg | DELAYED_RELEASE_TABLET | Freq: Every day | ORAL | Status: DC
  Administered 2020-12-29: 80 mg via ORAL
  Filled 2020-12-29: qty 2

## 2020-12-29 MED ORDER — ACETAMINOPHEN 325 MG PO TABS: 650.0000 mg | ORAL_TABLET | Freq: Four times a day (QID) | ORAL | Status: DC | PRN

## 2020-12-29 MED ORDER — MAGNESIUM HYDROXIDE 400 MG/5ML PO SUSP: 30.0000 mL | Freq: Every day | ORAL | Status: DC | PRN

## 2020-12-29 MED ORDER — TRAZODONE HCL 50 MG PO TABS
50.0000 mg | ORAL_TABLET | Freq: Every evening | ORAL | Status: DC | PRN
  Administered 2020-12-29: 50 mg via ORAL
  Filled 2020-12-29: qty 1

## 2020-12-29 MED ORDER — ALUM & MAG HYDROXIDE-SIMETH 200-200-20 MG/5ML PO SUSP: 30.0000 mL | ORAL | Status: DC | PRN

## 2020-12-29 MED ORDER — OLANZAPINE 10 MG PO TABS
10.0000 mg | ORAL_TABLET | Freq: Every day | ORAL | Status: DC
  Administered 2020-12-29: 10 mg via ORAL
  Filled 2020-12-29: qty 1

## 2020-12-29 MED ORDER — TRAZODONE HCL 50 MG PO TABS
50.0000 mg | ORAL_TABLET | Freq: Every evening | ORAL | Status: DC | PRN
  Filled 2020-12-29: qty 1

## 2020-12-29 MED ORDER — ZIPRASIDONE MESYLATE 20 MG IM SOLR: 20.0000 mg | Freq: Once | INTRAMUSCULAR | Status: DC

## 2020-12-29 MED ORDER — LORAZEPAM 2 MG/ML IJ SOLN: 2.0000 mg | Freq: Once | INTRAMUSCULAR | Status: DC

## 2020-12-29 MED ORDER — HYDROXYZINE HCL 10 MG PO TABS
10.0000 mg | ORAL_TABLET | Freq: Two times a day (BID) | ORAL | Status: DC | PRN
  Administered 2020-12-29: 10 mg via ORAL
  Filled 2020-12-29: qty 1

## 2020-12-29 MED ORDER — LORAZEPAM 2 MG/ML IJ SOLN: 2.0000 mg | Freq: Once | INTRAMUSCULAR | Status: AC

## 2020-12-29 MED ORDER — ZIPRASIDONE MESYLATE 20 MG IM SOLR
20.0000 mg | Freq: Once | INTRAMUSCULAR | Status: AC
  Administered 2020-12-29: 20 mg via INTRAMUSCULAR
  Filled 2020-12-29: qty 20

## 2020-12-29 MED ORDER — HYDROXYZINE HCL 25 MG PO TABS: 25.0000 mg | ORAL_TABLET | Freq: Three times a day (TID) | ORAL | Status: DC | PRN

## 2020-12-29 MED ORDER — AMPHETAMINE-DEXTROAMPHETAMINE 30 MG PO TABS: 30.0000 mg | ORAL_TABLET | Freq: Three times a day (TID) | ORAL | Status: DC

## 2020-12-29 MED ORDER — LORAZEPAM 2 MG/ML IJ SOLN
INTRAMUSCULAR | Status: AC
  Administered 2020-12-29: 2 mg via INTRAMUSCULAR
  Filled 2020-12-29: qty 1

## 2020-12-29 MED ORDER — TRAZODONE HCL 100 MG PO TABS
100.0000 mg | ORAL_TABLET | Freq: Every evening | ORAL | Status: DC | PRN
  Administered 2020-12-29: 100 mg via ORAL
  Filled 2020-12-29: qty 1

## 2020-12-29 MED ORDER — TRANEXAMIC ACID 650 MG PO TABS
1300.0000 mg | ORAL_TABLET | Freq: Three times a day (TID) | ORAL | Status: DC
  Administered 2020-12-29 – 2020-12-30 (×3): 1300 mg via ORAL
  Filled 2020-12-29 (×5): qty 2
  Filled 2020-12-29 (×2): qty 6
  Filled 2020-12-29 (×2): qty 2

## 2020-12-29 MED ORDER — ZIPRASIDONE HCL 20 MG PO CAPS
20.0000 mg | ORAL_CAPSULE | Freq: Once | ORAL | Status: AC
  Filled 2020-12-29: qty 1

## 2020-12-29 MED ORDER — LORAZEPAM 1 MG PO TABS: 2.0000 mg | ORAL_TABLET | Freq: Once | ORAL | Status: AC

## 2020-12-29 MED ORDER — OLANZAPINE 10 MG PO TABS: 10.0000 mg | ORAL_TABLET | Freq: Every day | ORAL | Status: DC

## 2020-12-29 MED ORDER — GABAPENTIN 300 MG PO CAPS
ORAL_CAPSULE | ORAL | Status: AC
  Filled 2020-12-29: qty 1

## 2020-12-29 MED ORDER — GABAPENTIN 100 MG PO CAPS
200.0000 mg | ORAL_CAPSULE | Freq: Two times a day (BID) | ORAL | Status: DC
  Administered 2020-12-29 – 2020-12-30 (×3): 200 mg via ORAL
  Filled 2020-12-29 (×3): qty 2

## 2020-12-29 MED ORDER — ALBUTEROL SULFATE HFA 108 (90 BASE) MCG/ACT IN AERS: 2.0000 | INHALATION_SPRAY | Freq: Four times a day (QID) | RESPIRATORY_TRACT | Status: DC | PRN

## 2020-12-29 NOTE — ED Notes (Signed)
Patient resting on bed, eyes closed.  RR even and unlabored. Will continue to monitor for safety.

## 2020-12-29 NOTE — ED Notes (Signed)
Awake laying in bed talking appropriately with other pt. No new issues noted. Will continue to monitor for safety

## 2020-12-29 NOTE — ED Notes (Signed)
Pt requesting Xanax again. Cecilio Asper, NP notified and states Xanax was discharged the last time pt was here. NP states pt may have Trazodone and Vistaril now. Pt in agreement with taking these medications.

## 2020-12-29 NOTE — ED Provider Notes (Signed)
FBC/OBS ASAP Discharge Summary  Date and Time: 12/29/2020 10:38 AM  Name: Misty Young  MRN:  [id number]   Discharge Diagnoses:  Final diagnoses:  Bipolar I disorder, most recent episode (or current) manic (HCC)    Subjective: Misty Young is requesting to discharge. " I didn't feel safe and now I am ready to leave." Patient has declined outpatient resources for substance abuse. Reported she is prescribed Adderall and stated she only use cocaine to fit in with her homeless peers.  She is denying suicidal or homicidal ideations, denied auditory or visual hallucinations. Denied delusion and or paranoid currently. Reported she is feeling better overall. She is awake, alert and oriented x 3. Patient was provided with outpatient resources at discharge. Support, encouragement and reassurances was provided.   Stay Summary: per admission assessment note: Misty Young is a 45y/o female. Patient presented to [facility] voluntarily. Patient presented to [facility] with chief complaint of "I need to be somewhere safe." Patient reported that she walked over to [facility] due to fear of getting killed by her soon to be ex-husband. She is very tangential, disorganized, and appears to be in a manic state. She reports that she has a history of Bipolar and has been off her medication due to misplacing her medication at a homeless shelter. Patient is a poor historian and unable to stay on topic. She report that she is running away from her soon to be ex-husband because she recently found out that he killed his previous wives for their disability checks. She is convinced that her husband and father-inlaw are plotting to kill her.  Patient also reports that she feels like people including law enforcement are tracking and listening to her every move. She reports that she recently found tracking devices in different objects in her home.    Total Time spent with patient: 15 minutes  Past Psychiatric History:  Past Medical History:   Past Medical History:  Diagnosis Date   Attention deficit disorder    Bipolar 1 disorder (HCC)    Hemorrhoids    uses Preparation H wipes and cream   Kidney stones    Opiate use     Past Surgical History:  Procedure Laterality Date   CESAREAN SECTION  2002/2004/2009/2013   x4   INSERTION OF MESH  06/15/2012   Procedure: INSERTION OF MESH;  Surgeon: Ernestene Mention, MD;  Location: [facility];  Service: General;  Laterality: N/A;   LITHOTRIPSY     THERAPEUTIC ABORTION     UMBILICAL HERNIA REPAIR  06/15/2012   Procedure: HERNIA REPAIR UMBILICAL ADULT;  Surgeon: Ernestene Mention, MD;  Location: [facility];  Service: General;  Laterality: N/A;   Family History:  Family History  Problem Relation Age of Onset   Diabetes Maternal Grandfather    Colon cancer Neg Hx    Family Psychiatric History:  Social History:  Social History   Substance and Sexual Activity  Alcohol Use Not Currently   Alcohol/week: 0.0 standard drinks   Comment: Denies ETOH (10/13/18); previously: occasionally     Social History   Substance and Sexual Activity  Drug Use Not Currently   Types: Methamphetamines   Comment: occ marijuana    Social History   Socioeconomic History   Marital status: Married    Spouse name: Not on file   Number of children: 4   Years of education: Not on file   Highest education level: Not on file  Occupational History   Occupation: unemployed  Tobacco  Use   Smoking status: Every Day    Packs/day: 0.50    Years: 15.00    Pack years: 7.50    Types: Cigarettes   Smokeless tobacco: Never  Substance and Sexual Activity   Alcohol use: Not Currently    Alcohol/week: 0.0 standard drinks    Comment: Denies ETOH (10/13/18); previously: occasionally   Drug use: Not Currently    Types: Methamphetamines    Comment: occ marijuana   Sexual activity: Not Currently    Birth control/protection: Surgical  Other Topics Concern   Not on file  Social History Narrative   Not on file    Social Determinants of Health   Financial Resource Strain: Not on file  Food Insecurity: Not on file  Transportation Needs: Not on file  Physical Activity: Not on file  Stress: Not on file  Social Connections: Not on file   SDOH:  SDOH Screenings   Alcohol Screen: Low Risk    Last Alcohol Screening Score (AUDIT): 2  Depression (PHQ2-9): Not on file  Financial Resource Strain: Not on file  Food Insecurity: Not on file  Housing: Not on file  Physical Activity: Not on file  Social Connections: Not on file  Stress: Not on file  Tobacco Use: High Risk   Smoking Tobacco Use: Every Day   Smokeless Tobacco Use: Never  Transportation Needs: Not on file    Tobacco Cessation:  N/A, patient does not currently use tobacco products  Current Medications:  Current Facility-Administered Medications  Medication Dose Route Frequency Provider Last Rate Last Admin   acetaminophen (TYLENOL) tablet 650 mg  650 mg Oral Q6H PRN Ajibola, Ene A, NP       alum & mag hydroxide-simeth (MAALOX/MYLANTA) 200-200-20 MG/5ML suspension 30 mL  30 mL Oral Q4H PRN Ajibola, Ene A, NP       amitriptyline (ELAVIL) tablet 50 mg  50 mg Oral QHS Ajibola, Ene A, NP       gabapentin (NEURONTIN) capsule 300 mg  300 mg Oral TID Ajibola, Ene A, NP   300 mg at 12/29/20 1023   hydrOXYzine (ATARAX/VISTARIL) tablet 10 mg  10 mg Oral BID PRN Ajibola, Ene A, NP   10 mg at 12/29/20 0352   magnesium hydroxide (MILK OF MAGNESIA) suspension 30 mL  30 mL Oral Daily PRN Ajibola, Ene A, NP       OLANZapine (ZYPREXA) tablet 10 mg  10 mg Oral QHS Ajibola, Ene A, NP       pantoprazole (PROTONIX) EC tablet 80 mg  80 mg Oral Daily Ajibola, Ene A, NP   80 mg at 12/29/20 1023   tranexamic acid (LYSTEDA) tablet 650 mg  650 mg Oral TID PRN Ajibola, Ene A, NP       traZODone (DESYREL) tablet 50 mg  50 mg Oral QHS PRN Ajibola, Ene A, NP   50 mg at 12/29/20 3329   Current Outpatient Medications  Medication Sig Dispense Refill   albuterol  (VENTOLIN HFA) 108 (90 Base) MCG/ACT inhaler Inhale 2 puffs into the lungs every 6 (six) hours as needed for wheezing or shortness of breath.     ALPRAZolam (XANAX) 1 MG tablet Take 1 mg by mouth at bedtime as needed for anxiety.     amitriptyline (ELAVIL) 50 MG tablet Take 1 tablet (50 mg total) by mouth at bedtime. For depression/sleep (Patient taking differently: Take 150 mg by mouth at bedtime.) 30 tablet 0   amphetamine-dextroamphetamine (ADDERALL) 30 MG tablet Take 30 mg by  mouth 3 (three) times daily.     benzonatate (TESSALON) 200 MG capsule Take 200 mg by mouth 3 (three) times daily as needed for cough.     gabapentin (NEURONTIN) 300 MG capsule Take 1 capsule (300 mg total) by mouth 3 (three) times daily. For agitation (Patient not taking: No sig reported) 90 capsule 0   gabapentin (NEURONTIN) 600 MG tablet Take 600 mg by mouth 3 (three) times daily as needed (pain).     hydrOXYzine (ATARAX/VISTARIL) 10 MG tablet Take 1 tablet (10 mg total) by mouth 2 (two) times daily as needed for anxiety. (Patient not taking: No sig reported) 75 tablet 0   Lurasidone HCl (LATUDA) 120 MG TABS Take 120 mg by mouth at bedtime.     OLANZapine (ZYPREXA) 10 MG tablet Take 1 tablet (10 mg total) by mouth at bedtime. For mood control (Patient not taking: No sig reported) 30 tablet 0   omeprazole (PRILOSEC) 20 MG capsule Take 20 mg by mouth 2 (two) times daily before a meal.     ondansetron (ZOFRAN) 8 MG tablet Take 8 mg by mouth every 8 (eight) hours as needed for nausea or vomiting.     pantoprazole (PROTONIX) 40 MG tablet Take 2 tablets (80 mg total) by mouth daily. For acid reflux (Patient not taking: No sig reported) 60 tablet 0   tranexamic acid (LYSTEDA) 650 MG TABS tablet Take 650 mg by mouth 3 (three) times daily as needed (only on periods).      PTA Medications: (Not in a hospital admission)   Musculoskeletal  Strength & Muscle Tone: within normal limits Gait & Station: normal Patient leans:  N/A  Psychiatric Specialty Exam  Presentation  General Appearance: Disheveled  Eye Contact:Good  Speech:Pressured  Speech Volume:Increased  Handedness:Right   Mood and Affect  Mood:Euthymic  Affect:Congruent   Thought Process  Thought Processes:Disorganized  Descriptions of Associations:Tangential  Orientation:Full (Time, Place and Person)  Thought Content:Tangential  Diagnosis of Schizophrenia or Schizoaffective disorder in past: No  Duration of Psychotic Symptoms: Less than six months   Hallucinations:Hallucinations: Visual Description of Visual Hallucinations: "flashes of lights"  Ideas of Reference:Delusions; Paranoia  Suicidal Thoughts:Suicidal Thoughts: No  Homicidal Thoughts:Homicidal Thoughts: No   Sensorium  Memory:Immediate Good; Recent Fair; Remote Fair  Judgment:Fair  Insight:Fair   Executive Functions  Concentration:Poor  Attention Span:Poor  Recall:Poor  Fund of Knowledge:Fair  Language:Fair   Psychomotor Activity  Psychomotor Activity:Psychomotor Activity: Normal   Assets  Assets:Desire for Improvement; Physical Health; Financial Resources/Insurance   Sleep  Sleep:Sleep: Fair Number of Hours of Sleep: 6   Nutritional Assessment (For OBS and FBC admissions only) Has the patient had a weight loss or gain of 10 pounds or more in the last 3 months?: No Has the patient had a decrease in food intake/or appetite?: No Does the patient have dental problems?: No Does the patient have eating habits or behaviors that may be indicators of an eating disorder including binging or inducing vomiting?: No Has the patient recently lost weight without trying?: No Has the patient been eating poorly because of a decreased appetite?: No Malnutrition Screening Tool Score: 0    Physical Exam  Physical Exam Vitals and nursing note reviewed.  Cardiovascular:     Rate and Rhythm: Normal rate and regular rhythm.  Pulmonary:     Effort:  Pulmonary effort is normal.  Neurological:     Mental Status: She is alert and oriented to person, place, and time.  Psychiatric:  Attention and Perception: Attention normal.        Mood and Affect: Mood is anxious.        Speech: Speech normal.        Behavior: Behavior normal.        Thought Content: Thought content normal. Thought content is not paranoid or delusional. Thought content does not include suicidal ideation.        Cognition and Memory: Cognition normal.        Judgment: Judgment normal.   ROS Blood pressure 118/88, pulse (!) 115, temperature 98.6 F (37 C), temperature source Oral, resp. rate 20, SpO2 98 %. There is no height or weight on file to calculate BMI.  Demographic Factors:  Low socioeconomic status and Unemployed  Loss Factors: Financial problems/change in socioeconomic status  Historical Factors: Family history of mental illness or substance abuse  Risk Reduction Factors:   Positive social support, Positive therapeutic relationship, and Positive coping skills or problem solving skills  Continued Clinical Symptoms:  Depression:   Aggression  Cognitive Features That Contribute To Risk:  Closed-mindedness and Loss of executive function    Suicide Risk:  Minimal: No identifiable suicidal ideation.  Patients presenting with no risk factors but with morbid ruminations; may be classified as minimal risk based on the severity of the depressive symptoms  Plan Of Care/Follow-up recommendations:  Activity:  as tolerated  Diet:  Heart Healthy     Disposition: Take all medications as prescribed. Keep all follow-up appointments as scheduled.  Do not consume alcohol or use illegal drugs while on prescription medications. Report any adverse effects from your medications to your primary care provider promptly.  In the event of recurrent symptoms or worsening symptoms, call 911, a crisis hotline, or go to the nearest emergency department for evaluation.     Oneta Rackanika N Caidence Higashi, NP 12/29/2020, 10:38 AM

## 2020-12-29 NOTE — ED Notes (Signed)
Pt sitting up on side of bed resting. A&O x4, calm and cooperative. No signs of acute distress noted. Will continue to monitor for safety.

## 2020-12-29 NOTE — ED Notes (Signed)
Asked Pt if she would like her meds PO or IM. Pt stated, "I'll take them however you want to give them. I'm not scared of a shot". Pulled PO meds. Called Pt to the window to admin meds, pt refused to accept them and stated that she was not going to take any meds. At that time, this nurse and another nurse pulled IM meds and admin geodon and ativan IM to each deltoid. Pt tolerated well. Pt had been arguing with staff, yelling and demanding to have a new pair of pants. Staff explained to Pt that we did not have another pair of pants that we would need to wash the pair that she was wearing. Pt was d/c earlier today and only remained on the grounds and never left the property. Pt requesting what was in her UDS, staff informed her. Pt crying and stating that her kids do not believe her, that her kids can have her whole check, etc. Pt currently resting on pull out bed at this time. Safety maintained and will continue to monitor.

## 2020-12-29 NOTE — ED Notes (Signed)
Discharge instructions provided and Pt stated understanding. Personal belongings returned from locker. Pt put her shoes in the trash but removed them when this nurse encouraged her to put them on. Pt stated that she did not really want them on because she has sores on her feet. Informed Pt that she would be outside and could make more sores on her feet. Pt thanked this nurse for the encouragement of getting the shoes from the trash can. Pt escorted to the front lobby. Pt alert, orient and ambulatory. Safety maintained.

## 2020-12-29 NOTE — BH Assessment (Signed)
Triage note:   Patient discharged from [facility] 10:50am, however, she never left the property. She was setting outside on the hill. Patient returned to the lobby of the [facility] presenting psychotic [facility]) crying and screaming asking where are drugs. Patient was unable to be calmed down and refused to sign the waiver of medical screening exam. Patient continue to cry expressing how she wants her family and she has missed up. Patient being re-admitted to th unit via IVC.

## 2020-12-29 NOTE — ED Notes (Addendum)
Pt admitted to continuous assessment due to manic behavior, paranoia, and delusions. Pt states she is here for "food, shelter, and a shower." Pt A&O x4, anxious but cooperative. Pt very talkative with disorganized speech, loose associations, and flight of ideas. Pt tolerated lab work and skin assessment well. Pt ambulated independently to unit. Oriented to unit/staff. Pt took shower and was given feminine hygiene products. No signs of acute distress noted. Will continue to monitor for safety.
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
## 2020-12-29 NOTE — ED Notes (Addendum)
Pt asking if she can have Xanax, states "Vistaril does nothing for me." Cecilio Asper, NP notified.

## 2020-12-29 NOTE — ED Notes (Signed)
Patient discharged.

## 2020-12-29 NOTE — BH Assessment (Signed)
Pt provided the the following names and contact numbers of family members. Pt requested they be made aware that she is safe and for them to not contact pt's mother.   Hyman Bower, cousin: [phone] Carollee Herter, cousin's husband: [phone] Malena Peer, daughter: [phone]  Clinician did not make contact with these family members due to the time of the night, pt's willingness to be observed overnight, and due to pt's current mental state (she may not consent in a more stable state-of-mind).

## 2020-12-29 NOTE — ED Notes (Signed)
Patient cooperative with skin assessment.  Ambulated independently to unit.  Tangential, agitated, but able to redirect.

## 2020-12-29 NOTE — ED Notes (Signed)
Pt psychotic, crying and cussing staff. Told nurse she "doesn't care how you give me the meds. Do I look scared of your damn needle?" then when nurse comes out with PO meds pt states she said she wanted them IM cussing staff even more. Pt becoming increasing loud and will not redirect. IM meds given per provider orders. Will continue to monitor for safety

## 2020-12-29 NOTE — ED Notes (Signed)
Temp 97.8

## 2020-12-29 NOTE — Discharge Instructions (Addendum)
Take all medications as prescribed. Keep all follow-up appointments as scheduled.  Do not consume alcohol or use illegal drugs while on prescription medications. Report any adverse effects from your medications to your primary care provider promptly.  In the event of recurrent symptoms or worsening symptoms, call 911, a crisis hotline, or go to the nearest emergency department for evaluation.   

## 2020-12-29 NOTE — ED Notes (Addendum)
Pt up to nurse's station, stating she is going to the bathroom to "check for ticks, they are probably all over me, I've been in the woods." Pt went to bathroom to check her hair then returned to bed. Will continue to monitor for safety.

## 2020-12-29 NOTE — ED Provider Notes (Addendum)
Behavioral Health Admission H&P [facility] & OBS)  Date: 12/29/20 Patient Name: Misty Young MRN: [id number] Chief Complaint:  Chief Complaint  Patient presents with   Manic Behavior   Delusional   Paranoid      Diagnoses:  Final diagnoses:  Bipolar I disorder, most recent episode (or current) manic (HCC)    HPI: Misty Young is a 45y/o female. Patient presented to [facility] voluntarily. Patient presented to [facility] with chief complaint of "I need to be somewhere safe." Patient reported that she walked over to [facility] due to fear of getting killed by her soon to be ex-husband. She is very tangential, disorganized, and appears to be in a manic state. She reports that she has a history of Bipolar and has been off her medication due to misplacing her medication at a homeless shelter. Patient is a poor historian and unable to stay on topic. She report that she is running away from her soon to be ex-husband because she recently found out that he killed his previous wives for their disability checks. She is convinced that her husband and father-inlaw are plotting to kill her.  Patient also reports that she feels like people including law enforcement are tracking and listening to her every move. She reports that she recently found tracking devices in different objects in her home.    Patient denies SI, HI, and AH. She is endorsing visual hallucination of seeing flashes of light. She appears to be paranoid about being tracked by law enforcement and being harmed by husband. She endorses a hx of alcohol abuse, last drink was 5 days ago. She also endorse hx of marijuana use but unable to state date of last use.   On assessment patient is alert, tangential, disorganized and manic. Patient had trouble answering question and staying on topic; she was able to maintain good eye contact, she didn't appear to be responding to any internal/external stimuli. she denied suicidal ideation/intent/plan. She denied chest  pain, SOB, fever, acute distress, pain, abd or urinary issues.  PHQ 2-9:   Flowsheet Row ED from 12/29/2020 in [facility] ED from 12/27/2020 in [facility] Admission (Discharged) [facility]  C-SSRS RISK CATEGORY No Risk No Risk No Risk        Total Time spent with patient: 45 minutes  Musculoskeletal  Strength & Muscle Tone: within normal limits Gait & Station: normal Patient leans: Right  Psychiatric Specialty Exam  Presentation General Appearance: Disheveled  Eye Contact:Good  Speech:Pressured  Speech Volume:Increased  Handedness:Right   Mood and Affect  Mood:Euthymic  Affect:Congruent   Thought Process  Thought Processes:Disorganized  Descriptions of Associations:Tangential  Orientation:Full (Time, Place and Person)  Thought Content:Tangential  Diagnosis of Schizophrenia or Schizoaffective disorder in past: No  Duration of Psychotic Symptoms: Less than six months  Hallucinations:Hallucinations: Visual Description of Visual Hallucinations: "flashes of lights"  Ideas of Reference:Delusions; Paranoia  Suicidal Thoughts:Suicidal Thoughts: No  Homicidal Thoughts:Homicidal Thoughts: No   Sensorium  Memory:Immediate Good; Recent Fair; Remote Fair  Judgment:Fair  Insight:Fair   Executive Functions  Concentration:Poor  Attention Span:Poor  Recall:Poor  Fund of Knowledge:Fair  Language:Fair   Psychomotor Activity  Psychomotor Activity:Psychomotor Activity: Normal   Assets  Assets:Desire for Improvement; Physical Health; Financial Resources/Insurance   Sleep  Sleep:Sleep: Fair Number of Hours of Sleep: 6   Nutritional Assessment (For OBS and [facility]BC admissions only) Has the patient had a weight loss or gain of  10 pounds or more in the last 3 months?: No Has the patient had a decrease in food intake/or appetite?: No Does the  patient have dental problems?: No Does the patient have eating habits or behaviors that may be indicators of an eating disorder including binging or inducing vomiting?: No Has the patient recently lost weight without trying?: No Has the patient been eating poorly because of a decreased appetite?: No Malnutrition Screening Tool Score: 0   Physical Exam Vitals and nursing note reviewed.  Constitutional:      General: She is not in acute distress.    Appearance: She is well-developed.  HENT:     Head: Normocephalic and atraumatic.     Nose: No congestion or rhinorrhea.  Eyes:     Conjunctiva/sclera: Conjunctivae normal.  Cardiovascular:     Rate and Rhythm: Normal rate.     Heart sounds: No murmur heard. Pulmonary:     Effort: Pulmonary effort is normal. No respiratory distress.     Breath sounds: Normal breath sounds.  Abdominal:     Palpations: Abdomen is soft.     Tenderness: There is no abdominal tenderness.  Musculoskeletal:     Cervical back: Neck supple.  Skin:    General: Skin is warm and dry.  Neurological:     Mental Status: She is alert and oriented to person, place, and time.  Psychiatric:        Attention and Perception: She perceives visual hallucinations. She does not perceive auditory hallucinations.        Mood and Affect: Mood and affect normal.        Speech: Speech normal.        Behavior: Behavior normal. Behavior is cooperative.        Thought Content: Thought content is paranoid and delusional. Thought content does not include homicidal or suicidal ideation. Thought content does not include homicidal or suicidal plan.        Cognition and Memory: Cognition normal.        Judgment: Judgment is impulsive.   Review of Systems  Constitutional: Negative.  Negative for chills, fever and weight loss.  HENT: Negative.    Eyes: Negative.   Respiratory: Negative.    Cardiovascular: Negative.   Gastrointestinal: Negative.   Genitourinary: Negative.    Musculoskeletal: Negative.   Skin:  Negative for itching and rash.  Neurological: Negative.   Endo/Heme/Allergies: Negative.   Psychiatric/Behavioral:  Positive for hallucinations and substance abuse.    Blood pressure 118/88, pulse (!) 115, temperature 98.6 F (37 C), temperature source Oral, resp. rate 20, SpO2 98 %. There is no height or weight on file to calculate BMI.  Past Psychiatric History: Bipolar. Panic attack,  ADHD, Polysubstance abuse,   Is the patient at risk to self? No  Has the patient been a risk to self in the past 6 months? No .    Has the patient been a risk to self within the distant past? Yes   Is the patient a risk to others? No   Has the patient been a risk to others in the past 6 months? No   Has the patient been a risk to others within the distant past? No   Past Medical History:  Past Medical History:  Diagnosis Date   Attention deficit disorder    Bipolar 1 disorder (HCC)    Hemorrhoids    uses Preparation H wipes and cream   Kidney stones    Opiate use  Past Surgical History:  Procedure Laterality Date   CESAREAN SECTION  2002/2004/2009/2013   x4   INSERTION OF MESH  06/15/2012   Procedure: INSERTION OF MESH;  Surgeon: Ernestene Mention, MD;  Location: [facility] ORS;  Service: General;  Laterality: N/A;   LITHOTRIPSY     THERAPEUTIC ABORTION     UMBILICAL HERNIA REPAIR  06/15/2012   Procedure: HERNIA REPAIR UMBILICAL ADULT;  Surgeon: Ernestene Mention, MD;  Location: [facility] ORS;  Service: General;  Laterality: N/A;    Family History:  Family History  Problem Relation Age of Onset   Diabetes Maternal Grandfather    Colon cancer Neg Hx     Social History:  Social History   Socioeconomic History   Marital status: Married    Spouse name: Not on file   Number of children: 4   Years of education: Not on file   Highest education level: Not on file  Occupational History   Occupation: unemployed  Tobacco Use   Smoking status: Every Day     Packs/day: 0.50    Years: 15.00    Pack years: 7.50    Types: Cigarettes   Smokeless tobacco: Never  Substance and Sexual Activity   Alcohol use: Not Currently    Alcohol/week: 0.0 standard drinks    Comment: Denies ETOH (10/13/18); previously: occasionally   Drug use: Not Currently    Types: Methamphetamines    Comment: occ marijuana   Sexual activity: Not Currently    Birth control/protection: Surgical  Other Topics Concern   Not on file  Social History Narrative   Not on file   Social Determinants of Health   Financial Resource Strain: Not on file  Food Insecurity: Not on file  Transportation Needs: Not on file  Physical Activity: Not on file  Stress: Not on file  Social Connections: Not on file  Intimate Partner Violence: Not on file    SDOH:  SDOH Screenings   Alcohol Screen: Low Risk    Last Alcohol Screening Score (AUDIT): 2  Depression (PHQ2-9): Not on file  Financial Resource Strain: Not on file  Food Insecurity: Not on file  Housing: Not on file  Physical Activity: Not on file  Social Connections: Not on file  Stress: Not on file  Tobacco Use: High Risk   Smoking Tobacco Use: Every Day   Smokeless Tobacco Use: Never  Transportation Needs: Not on file    Last Labs:  Admission on 12/29/2020  Component Date Value Ref Range Status   SARSCOV2ONAVIRUS 2 AG 12/29/2020 NEGATIVE  NEGATIVE Final   Comment: (NOTE) SARS-CoV-2 antigen NOT DETECTED.   Negative results are presumptive.  Negative results do not preclude SARS-CoV-2 infection and should not be used as the sole basis for treatment or other patient management decisions, including infection  control decisions, particularly in the presence of clinical signs and  symptoms consistent with COVID-19, or in those who have been in contact with the virus.  Negative results must be combined with clinical observations, patient history, and epidemiological information. The expected result is Negative.  Fact  Sheet for Patients: https://www.jennings-kim.com/  Fact Sheet for Healthcare Providers: https://alexander-rogers.biz/  This test is not yet approved or cleared by the [place] FDA and  has been authorized for detection and/or diagnosis of SARS-CoV-2 by FDA under an Emergency Use Authorization (EUA).  This EUA will remain in effect (meaning this test can be used) for the duration of  the COV  ID-19 declaration under Section 564(b)(1) of the Act, 21 U.S.C. section 360bbb-3(b)(1), unless the authorization is terminated or revoked sooner.     POC Amphetamine UR 12/29/2020 Positive (A) NONE DETECTED (Cut Off Level 1000 ng/mL) Final   POC Secobarbital (BAR) 12/29/2020 None Detected  NONE DETECTED (Cut Off Level 300 ng/mL) Final   POC Buprenorphine (BUP) 12/29/2020 None Detected  NONE DETECTED (Cut Off Level 10 ng/mL) Final   POC Oxazepam (BZO) 12/29/2020 Positive (A) NONE DETECTED (Cut Off Level 300 ng/mL) Final   POC Cocaine UR 12/29/2020 Positive (A) NONE DETECTED (Cut Off Level 300 ng/mL) Final   POC Methamphetamine UR 12/29/2020 Positive (A) NONE DETECTED (Cut Off Level 1000 ng/mL) Final   POC Morphine 12/29/2020 None Detected  NONE DETECTED (Cut Off Level 300 ng/mL) Final   POC Oxycodone UR 12/29/2020 None Detected  NONE DETECTED (Cut Off Level 100 ng/mL) Final   POC Methadone UR 12/29/2020 None Detected  NONE DETECTED (Cut Off Level 300 ng/mL) Final   POC Marijuana UR 12/29/2020 Positive (A) NONE DETECTED (Cut Off Level 50 ng/mL) Final  Admission on 12/27/2020, Discharged on 12/27/2020  Component Date Value Ref Range Status   SARS Coronavirus 2 by RT PCR 12/27/2020 NEGATIVE  NEGATIVE Final   Comment: (NOTE) SARS-CoV-2 target nucleic acids are NOT DETECTED.  The SARS-CoV-2 RNA is generally detectable in upper respiratory specimens during the acute phase of infection. The lowest concentration of SARS-CoV-2 viral copies this assay can  detect is 138 copies/mL. A negative result does not preclude SARS-Cov-2 infection and should not be used as the sole basis for treatment or other patient management decisions. A negative result may occur with  improper specimen collection/handling, submission of specimen other than nasopharyngeal swab, presence of viral mutation(s) within the areas targeted by this assay, and inadequate number of viral copies(<138 copies/mL). A negative result must be combined with clinical observations, patient history, and epidemiological information. The expected result is Negative.  Fact Sheet for Patients:  BloggerCourse.com  Fact Sheet for Healthcare Providers:  SeriousBroker.it  This test is no                          t yet approved or cleared by the [place] FDA and  has been authorized for detection and/or diagnosis of SARS-CoV-2 by FDA under an Emergency Use Authorization (EUA). This EUA will remain  in effect (meaning this test can be used) for the duration of the COVID-19 declaration under Section 564(b)(1) of the Act, 21 U.S.C.section 360bbb-3(b)(1), unless the authorization is terminated  or revoked sooner.       Influenza A by PCR 12/27/2020 NEGATIVE  NEGATIVE Final   Influenza B by PCR 12/27/2020 NEGATIVE  NEGATIVE Final   Comment: (NOTE) The Xpert Xpress SARS-CoV-2/FLU/RSV plus assay is intended as an aid in the diagnosis of influenza from Nasopharyngeal swab specimens and should not be used as a sole basis for treatment. Nasal washings and aspirates are unacceptable for Xpert Xpress SARS-CoV-2/FLU/RSV testing.  Fact Sheet for Patients: BloggerCourse.com  Fact Sheet for Healthcare Providers: SeriousBroker.it  This test is not yet approved or cleared by the [place] FDA and has been authorized for detection and/or diagnosis of SARS-CoV-2 by FDA under an Emergency  Use Authorization (EUA). This EUA will remain in effect (meaning this test can be used) for the duration of the COVID-19 declaration under Section 564(b)(1) of the Act, 21 U.S.C. section 360bbb-3(b)(1), unless the authorization is terminated or revoked.  Performed at [facility], [address] [place], [place] [address]    Sodium 12/27/2020 139  135 - 145 mmol/L Final   Potassium 12/27/2020 3.2 (A) 3.5 - 5.1 mmol/L Final   Chloride 12/27/2020 106  98 - 111 mmol/L Final   CO2 12/27/2020 23  22 - 32 mmol/L Final   Glucose, Bld 12/27/2020 81  70 - 99 mg/dL Final   Glucose reference range applies only to samples taken after fasting for at least 8 hours.   BUN 12/27/2020 22 (A) 6 - 20 mg/dL Final   Creatinine, Ser 12/27/2020 0.95  0.44 - 1.00 mg/dL Final   Calcium 60/45/409806/23/2022 8.9  8.9 - 10.3 mg/dL Final   Total Protein 11/91/478206/23/2022 7.0  6.5 - 8.1 g/dL Final   Albumin 95/62/130806/23/2022 4.1  3.5 - 5.0 g/dL Final   AST 65/78/469606/23/2022 24  15 - 41 U/L Final   ALT 12/27/2020 39  0 - 44 U/L Final   Alkaline Phosphatase 12/27/2020 74  38 - 126 U/L Final   Total Bilirubin 12/27/2020 1.2  0.3 - 1.2 mg/dL Final   GFR, Estimated 12/27/2020 >60  >60 mL/min Final   Comment: (NOTE) Calculated using the CKD-EPI Creatinine Equation (2021)    Anion gap 12/27/2020 10  5 - 15 Final   Performed at [facility], [address] [place], [place] [address]   Alcohol, Ethyl (B) 12/27/2020 <10  <10 mg/dL Final   Comment: (NOTE) Lowest detectable limit for serum alcohol is 10 mg/dL.  For medical purposes only. Performed at [facility], [address] [place], [place] [address]    Opiates 12/27/2020 NONE DETECTED  NONE DETECTED Final   Cocaine 12/27/2020 POSITIVE (A) NONE DETECTED Final   Benzodiazepines 12/27/2020 NONE DETECTED  NONE DETECTED Final   Amphetamines 12/27/2020 POSITIVE (A) NONE DETECTED Final   Tetrahydrocannabinol 12/27/2020 POSITIVE (A) NONE  DETECTED Final   Barbiturates 12/27/2020 NONE DETECTED  NONE DETECTED Final   Comment: (NOTE) DRUG SCREEN FOR MEDICAL PURPOSES ONLY.  IF CONFIRMATION IS NEEDED FOR ANY PURPOSE, NOTIFY LAB WITHIN 5 DAYS.  LOWEST DETECTABLE LIMITS FOR URINE DRUG SCREEN Drug Class                     Cutoff (ng/mL) Amphetamine and metabolites    1000 Barbiturate and metabolites    200 Benzodiazepine                 200 Tricyclics and metabolites     300 Opiates and metabolites        300 Cocaine and metabolites        300 THC                            50 Performed at [facility], [address] [place], [place] [address]    WBC 12/27/2020 10.7 (A) 4.0 - 10.5 K/uL Final   RBC 12/27/2020 3.80 (A) 3.87 - 5.11 MIL/uL Final   Hemoglobin 12/27/2020 11.2 (A) 12.0 - 15.0 g/dL Final   HCT 72/53/664406/23/2022 34.3 (A) 36.0 - 46.0 % Final   MCV 12/27/2020 90.3  80.0 - 100.0 fL Final   MCH 12/27/2020 29.5  26.0 - 34.0 pg Final   MCHC 12/27/2020 32.7  30.0 - 36.0 g/dL Final   RDW 03/47/425906/23/2022 14.8  11.5 - 15.5 % Final   Platelets 12/27/2020 258  150 - 400 K/uL Final  nRBC 12/27/2020 0.0  0.0 - 0.2 % Final   Neutrophils Relative % 12/27/2020 70  % Final   Neutro Abs 12/27/2020 7.6  1.7 - 7.7 K/uL Final   Lymphocytes Relative 12/27/2020 15  % Final   Lymphs Abs 12/27/2020 1.6  0.7 - 4.0 K/uL Final   Monocytes Relative 12/27/2020 7  % Final   Monocytes Absolute 12/27/2020 0.7  0.1 - 1.0 K/uL Final   Eosinophils Relative 12/27/2020 6  % Final   Eosinophils Absolute 12/27/2020 0.6 (A) 0.0 - 0.5 K/uL Final   Basophils Relative 12/27/2020 1  % Final   Basophils Absolute 12/27/2020 0.1  0.0 - 0.1 K/uL Final   Immature Granulocytes 12/27/2020 1  % Final   Abs Immature Granulocytes 12/27/2020 0.05  0.00 - 0.07 K/uL Final   Performed at [facility], [address], [place], [place] [address]   I-stat hCG, quantitative 12/27/2020 <5.0  <5 mIU/mL Final   Comment 3 12/27/2020           Final   Comment:   GEST. AGE      CONC.  (mIU/mL)   <=1 WEEK        5 - 50     2 WEEKS       50 - 500     3 WEEKS       100 - 10,000     4 WEEKS     1,000 - 30,000        FEMALE AND NON-PREGNANT FEMALE:     LESS THAN 5 mIU/mL   Admission on 12/14/2020, Discharged on 12/21/2020  Component Date Value Ref Range Status   TSH 12/15/2020 0.590  0.350 - 4.500 uIU/mL Final   Comment: Performed by a 3rd Generation assay with a functional sensitivity of <=0.01 uIU/mL. Performed at [facility], [address] [place], [place] [address]    Hgb A1c MFr Bld 12/15/2020 5.6  4.8 - 5.6 % Final   Comment: (NOTE)         Prediabetes: 5.7 - 6.4         Diabetes: >6.4         Glycemic control for adults with diabetes: <7.0    Mean Plasma Glucose 12/15/2020 114  mg/dL Final   Comment: (NOTE) Performed At: [facility] [address] [place], [place] [id number] Jolene Schimke MD WG:[id number]    Cholesterol 12/15/2020 158  0 - 200 mg/dL Final   Triglycerides 78/46/9629 103  <150 mg/dL Final   HDL 52/84/1324 39 (A) >40 mg/dL Final   Total CHOL/HDL Ratio 12/15/2020 4.1  RATIO Final   VLDL 12/15/2020 21  0 - 40 mg/dL Final   LDL Cholesterol 12/15/2020 98  0 - 99 mg/dL Final   Comment:        Total Cholesterol/HDL:CHD Risk Coronary Heart Disease Risk Table                     Men   Women  1/2 Average Risk   3.4   3.3  Average Risk       5.0   4.4  2 X Average Risk   9.6   7.1  3 X Average Risk  23.4   11.0        Use the calculated Patient Ratio above and the CHD Risk Table to determine the patient's CHD Risk.        ATP III CLASSIFICATION (LDL):  <100  mg/dL   Optimal  161-096  mg/dL   Near or Above                    Optimal  130-159  mg/dL   Borderline  045-409  mg/dL   High  >811     mg/dL   Very High Performed at [facility], [address] [place], [place] [address]   Admission on 12/13/2020, Discharged on 12/14/2020  Component  Date Value Ref Range Status   SARS Coronavirus 2 by RT PCR 12/13/2020 NEGATIVE  NEGATIVE Final   Comment: (NOTE) SARS-CoV-2 target nucleic acids are NOT DETECTED.  The SARS-CoV-2 RNA is generally detectable in upper respiratory specimens during the acute phase of infection. The lowest concentration of SARS-CoV-2 viral copies this assay can detect is 138 copies/mL. A negative result does not preclude SARS-Cov-2 infection and should not be used as the sole basis for treatment or other patient management decisions. A negative result may occur with  improper specimen collection/handling, submission of specimen other than nasopharyngeal swab, presence of viral mutation(s) within the areas targeted by this assay, and inadequate number of viral copies(<138 copies/mL). A negative result must be combined with clinical observations, patient history, and epidemiological information. The expected result is Negative.  Fact Sheet for Patients:  BloggerCourse.com  Fact Sheet for Healthcare Providers:  SeriousBroker.it  This test is no                          t yet approved or cleared by the [place] FDA and  has been authorized for detection and/or diagnosis of SARS-CoV-2 by FDA under an Emergency Use Authorization (EUA). This EUA will remain  in effect (meaning this test can be used) for the duration of the COVID-19 declaration under Section 564(b)(1) of the Act, 21 U.S.C.section 360bbb-3(b)(1), unless the authorization is terminated  or revoked sooner.       Influenza A by PCR 12/13/2020 NEGATIVE  NEGATIVE Final   Influenza B by PCR 12/13/2020 NEGATIVE  NEGATIVE Final   Comment: (NOTE) The Xpert Xpress SARS-CoV-2/FLU/RSV plus assay is intended as an aid in the diagnosis of influenza from Nasopharyngeal swab specimens and should not be used as a sole basis for treatment. Nasal washings and aspirates are unacceptable for Xpert Xpress  SARS-CoV-2/FLU/RSV testing.  Fact Sheet for Patients: BloggerCourse.com  Fact Sheet for Healthcare Providers: SeriousBroker.it  This test is not yet approved or cleared by the [place] FDA and has been authorized for detection and/or diagnosis of SARS-CoV-2 by FDA under an Emergency Use Authorization (EUA). This EUA will remain in effect (meaning this test can be used) for the duration of the COVID-19 declaration under Section 564(b)(1) of the Act, 21 U.S.C. section 360bbb-3(b)(1), unless the authorization is terminated or revoked.  Performed at [facility], [address], [place], [place] [address]    I-stat hCG, quantitative 12/13/2020 <5.0  <5 mIU/mL Final   Comment 3 12/13/2020          Final   Comment:   GEST. AGE      CONC.  (mIU/mL)   <=1 WEEK        5 - 50     2 WEEKS       50 - 500     3 WEEKS       100 - 10,000     4 WEEKS     1,000 - 30,000  FEMALE AND NON-PREGNANT FEMALE:     LESS THAN 5 mIU/mL    Sodium 12/13/2020 137  135 - 145 mmol/L Final   Potassium 12/13/2020 3.6  3.5 - 5.1 mmol/L Final   Chloride 12/13/2020 102  98 - 111 mmol/L Final   CO2 12/13/2020 27  22 - 32 mmol/L Final   Glucose, Bld 12/13/2020 106 (A) 70 - 99 mg/dL Final   Glucose reference range applies only to samples taken after fasting for at least 8 hours.   BUN 12/13/2020 12  6 - 20 mg/dL Final   Creatinine, Ser 12/13/2020 1.06 (A) 0.44 - 1.00 mg/dL Final   Calcium 38/18/2993 9.3  8.9 - 10.3 mg/dL Final   Total Protein 71/69/6789 6.2 (A) 6.5 - 8.1 g/dL Final   Albumin 38/04/1750 3.6  3.5 - 5.0 g/dL Final   AST 02/58/5277 18  15 - 41 U/L Final   ALT 12/13/2020 21  0 - 44 U/L Final   Alkaline Phosphatase 12/13/2020 62  38 - 126 U/L Final   Total Bilirubin 12/13/2020 0.5  0.3 - 1.2 mg/dL Final   GFR, Estimated 12/13/2020 >60  >60 mL/min Final   Comment: (NOTE) Calculated using the CKD-EPI Creatinine Equation (2021)    Anion  gap 12/13/2020 8  5 - 15 Final   Performed at [facility], [address], [place], [place] [address]   Alcohol, Ethyl (B) 12/13/2020 <10  <10 mg/dL Final   Comment: (NOTE) Lowest detectable limit for serum alcohol is 10 mg/dL.  For medical purposes only. Performed at [facility], [address], [place], [place] [address]    Opiates 12/14/2020 NONE DETECTED  NONE DETECTED Final   Cocaine 12/14/2020 NONE DETECTED  NONE DETECTED Final   Benzodiazepines 12/14/2020 POSITIVE (A) NONE DETECTED Final   Amphetamines 12/14/2020 POSITIVE (A) NONE DETECTED Final   Tetrahydrocannabinol 12/14/2020 POSITIVE (A) NONE DETECTED Final   Barbiturates 12/14/2020 NONE DETECTED  NONE DETECTED Final   Comment: (NOTE) DRUG SCREEN FOR MEDICAL PURPOSES ONLY.  IF CONFIRMATION IS NEEDED FOR ANY PURPOSE, NOTIFY LAB WITHIN 5 DAYS.  LOWEST DETECTABLE LIMITS FOR URINE DRUG SCREEN Drug Class                     Cutoff (ng/mL) Amphetamine and metabolites    1000 Barbiturate and metabolites    200 Benzodiazepine                 200 Tricyclics and metabolites     300 Opiates and metabolites        300 Cocaine and metabolites        300 THC                            50 Performed at [facility], [address], [place], [place] [address]    WBC 12/13/2020 6.3  4.0 - 10.5 K/uL Final   RBC 12/13/2020 4.19  3.87 - 5.11 MIL/uL Final   Hemoglobin 12/13/2020 12.4  12.0 - 15.0 g/dL Final   HCT 00/86/7619 37.3  36.0 - 46.0 % Final   MCV 12/13/2020 89.0  80.0 - 100.0 fL Final   MCH 12/13/2020 29.6  26.0 - 34.0 pg Final   MCHC 12/13/2020 33.2  30.0 - 36.0 g/dL Final   RDW 50/93/2671 14.4  11.5 - 15.5 % Final   Platelets 12/13/2020 298  150 - 400 K/uL Final   nRBC  12/13/2020 0.0  0.0 - 0.2 % Final   Neutrophils Relative % 12/13/2020 59  % Final   Neutro Abs 12/13/2020 3.7  1.7 - 7.7 K/uL Final   Lymphocytes Relative 12/13/2020 29  % Final   Lymphs Abs 12/13/2020 1.8  0.7 - 4.0 K/uL Final    Monocytes Relative 12/13/2020 7  % Final   Monocytes Absolute 12/13/2020 0.4  0.1 - 1.0 K/uL Final   Eosinophils Relative 12/13/2020 4  % Final   Eosinophils Absolute 12/13/2020 0.2  0.0 - 0.5 K/uL Final   Basophils Relative 12/13/2020 1  % Final   Basophils Absolute 12/13/2020 0.1  0.0 - 0.1 K/uL Final   Immature Granulocytes 12/13/2020 0  % Final   Abs Immature Granulocytes 12/13/2020 0.02  0.00 - 0.07 K/uL Final   Performed at [facility], [address], [place], [place] [address]  Admission on 09/29/2020, Discharged on 09/29/2020  Component Date Value Ref Range Status   TSH 09/29/2020 0.649  0.350 - 4.500 uIU/mL Final   Comment: Performed by a 3rd Generation assay with a functional sensitivity of <=0.01 uIU/mL. Performed at [facility], [address] [place], [place] [address]    Salicylate Lvl 09/29/2020 <7.0 (A) 7.0 - 30.0 mg/dL Final   Performed at [facility], [address] [place], [place] [address]   Acetaminophen (Tylenol), Serum 09/29/2020 <10 (A) 10 - 30 ug/mL Final   Comment: (NOTE) Therapeutic concentrations vary significantly. A range of 10-30 ug/mL  may be an effective concentration for many patients. However, some  are best treated at concentrations outside of this range. Acetaminophen concentrations >150 ug/mL at 4 hours after ingestion  and >50 ug/mL at 12 hours after ingestion are often associated with  toxic reactions.  Performed at [facility], [address] [place], [place] [address]    Opiates 09/29/2020 NONE DETECTED  NONE DETECTED Final   Cocaine 09/29/2020 NONE DETECTED  NONE DETECTED Final   Benzodiazepines 09/29/2020 POSITIVE (A) NONE DETECTED Final   Amphetamines 09/29/2020 POSITIVE (A) NONE DETECTED Final   Tetrahydrocannabinol 09/29/2020 POSITIVE (A) NONE DETECTED Final   Barbiturates 09/29/2020 NONE DETECTED  NONE DETECTED Final   Comment: (NOTE) DRUG SCREEN FOR MEDICAL PURPOSES ONLY.  IF CONFIRMATION IS  NEEDED FOR ANY PURPOSE, NOTIFY LAB WITHIN 5 DAYS.  LOWEST DETECTABLE LIMITS FOR URINE DRUG SCREEN Drug Class                     Cutoff (ng/mL) Amphetamine and metabolites    1000 Barbiturate and metabolites    200 Benzodiazepine                 200 Tricyclics and metabolites     300 Opiates and metabolites        300 Cocaine and metabolites        300 THC                            50 Performed at [facility], [address] [place], [place] [address]    Sodium 09/29/2020 136  135 - 145 mmol/L Final   Potassium 09/29/2020 3.6  3.5 - 5.1 mmol/L Final   Chloride 09/29/2020 104  98 - 111 mmol/L Final   CO2 09/29/2020 27  22 - 32 mmol/L Final   Glucose, Bld 09/29/2020 101 (A) 70 - 99 mg/dL Final   Glucose reference range  applies only to samples taken after fasting for at least 8 hours.   BUN 09/29/2020 7  6 - 20 mg/dL Final   Creatinine, Ser 09/29/2020 0.92  0.44 - 1.00 mg/dL Final   Calcium 16/01/3709 8.8 (A) 8.9 - 10.3 mg/dL Final   Total Protein 62/69/4854 6.3 (A) 6.5 - 8.1 g/dL Final   Albumin 62/70/3500 3.5  3.5 - 5.0 g/dL Final   AST 93/81/8299 18  15 - 41 U/L Final   ALT 09/29/2020 22  0 - 44 U/L Final   Alkaline Phosphatase 09/29/2020 60  38 - 126 U/L Final   Total Bilirubin 09/29/2020 0.4  0.3 - 1.2 mg/dL Final   GFR, Estimated 09/29/2020 >60  >60 mL/min Final   Comment: (NOTE) Calculated using the CKD-EPI Creatinine Equation (2021)    Anion gap 09/29/2020 5  5 - 15 Final   Performed at [facility], [address], [place], [place] [address]   Lipase 09/29/2020 42  11 - 51 U/L Final   Performed at [facility], [address], [place], [place] [address]   WBC 09/29/2020 7.3  4.0 - 10.5 K/uL Final   RBC 09/29/2020 4.57  3.87 - 5.11 MIL/uL Final   Hemoglobin 09/29/2020 13.2  12.0 - 15.0 g/dL Final   HCT 81/07/7508 40.8  36.0 - 46.0 % Final   MCV 09/29/2020 89.3  80.0 - 100.0 fL Final   MCH 09/29/2020 28.9  26.0 - 34.0 pg Final   MCHC 09/29/2020  32.4  30.0 - 36.0 g/dL Final   RDW 25/85/2778 15.1  11.5 - 15.5 % Final   Platelets 09/29/2020 338  150 - 400 K/uL Final   nRBC 09/29/2020 0.0  0.0 - 0.2 % Final   Neutrophils Relative % 09/29/2020 71  % Final   Neutro Abs 09/29/2020 5.2  1.7 - 7.7 K/uL Final   Lymphocytes Relative 09/29/2020 19  % Final   Lymphs Abs 09/29/2020 1.4  0.7 - 4.0 K/uL Final   Monocytes Relative 09/29/2020 6  % Final   Monocytes Absolute 09/29/2020 0.5  0.1 - 1.0 K/uL Final   Eosinophils Relative 09/29/2020 3  % Final   Eosinophils Absolute 09/29/2020 0.2  0.0 - 0.5 K/uL Final   Basophils Relative 09/29/2020 1  % Final   Basophils Absolute 09/29/2020 0.1  0.0 - 0.1 K/uL Final   Immature Granulocytes 09/29/2020 0  % Final   Abs Immature Granulocytes 09/29/2020 0.02  0.00 - 0.07 K/uL Final   Performed at [facility], [address], [place], [place] [address]   Color, Urine 09/29/2020 STRAW (A) YELLOW Final   APPearance 09/29/2020 CLEAR  CLEAR Final   Specific Gravity, Urine 09/29/2020 1.003 (A) 1.005 - 1.030 Final   pH 09/29/2020 7.0  5.0 - 8.0 Final   Glucose, UA 09/29/2020 NEGATIVE  NEGATIVE mg/dL Final   Hgb urine dipstick 09/29/2020 SMALL (A) NEGATIVE Final   Bilirubin Urine 09/29/2020 NEGATIVE  NEGATIVE Final   Ketones, ur 09/29/2020 NEGATIVE  NEGATIVE mg/dL Final   Protein, ur 36/14/4315 NEGATIVE  NEGATIVE mg/dL Final   Nitrite 40/02/6760 NEGATIVE  NEGATIVE Final   Leukocytes,Ua 09/29/2020 NEGATIVE  NEGATIVE Final   WBC, UA 09/29/2020 0-5  0 - 5 WBC/hpf Final   Bacteria, UA 09/29/2020 RARE (A) NONE SEEN Final   Squamous Epithelial / LPF 09/29/2020 6-10  0 - 5 Final   Performed at [facility], [address], [place], [place] [address]   SARS  Coronavirus 2 09/29/2020 NEGATIVE  NEGATIVE Final   Comment: (NOTE) SARS-CoV-2 target nucleic acids are NOT DETECTED.  The SARS-CoV-2 RNA is generally detectable in upper and lower respiratory specimens during the acute phase of infection.  Negative results do not preclude SARS-CoV-2 infection, do not rule out co-infections with other pathogens, and should not be used as the sole basis for treatment or other patient management decisions. Negative results must be combined with clinical observations, patient history, and epidemiological information. The expected result is Negative.  Fact Sheet for Patients: HairSlick.no  Fact Sheet for Healthcare Providers: quierodirigir.com  This test is not yet approved or cleared by the [place] FDA and  has been authorized for detection and/or diagnosis of SARS-CoV-2 by FDA under an Emergency Use Authorization (EUA). This EUA will remain  in effect (meaning this test can be used) for the duration of the COVID-19 declaration under Se                          ction 564(b)(1) of the Act, 21 U.S.C. section 360bbb-3(b)(1), unless the authorization is terminated or revoked sooner.  Performed at [facility], [address], [place], [place] [address]    Preg Test, Ur 09/29/2020 NEGATIVE  NEGATIVE Final   Comment:        THE SENSITIVITY OF THIS METHODOLOGY IS >20 mIU/mL. Performed at [facility], [address], [place], [place] [address]     Allergies: Tramadol hcl and Penicillins  PTA Medications: (Not in a hospital admission)   Medical Decision Making  Patient presented to [facility] in a manic state due to non-compliance with medication regime; patient will be admitted to [facility] for continuous assessment and stabilization. -restart home medication -agitation protocol. -    Recommendations  Based on my evaluation the patient does not appear to have an emergency medical condition.  Maricela Bo, NP 12/29/20  6:15 AM

## 2020-12-29 NOTE — ED Notes (Signed)
Pt sitting on side of bed talking quietly to self. Sandwich given per pt request. No signs of acute distress noted. Will continue to monitor for safety.

## 2020-12-29 NOTE — ED Notes (Signed)
Patient returned to unit after being discharged. Patient verbally aggressive and whinny. Patient given toiletries for self-care.

## 2020-12-29 NOTE — ED Notes (Signed)
SANDWICH given

## 2020-12-29 NOTE — ED Notes (Addendum)
Pt requesting "shots again" because "I'm psychotic." Pt sitting on side of bed, calm and cooperative. No signs of acute distress noted. Cecilio Asper, NP notified and advised to give PRN Trazodone 50mg . Pt states Trazodone "doesn't work, it isn't enough" and states she wants to take her Elavil. , NP notified.

## 2020-12-29 NOTE — ED Notes (Signed)
Patient given PB&J Sandwich and Grape Juice.

## 2020-12-29 NOTE — ED Notes (Signed)
Grape juice x2 given upon request

## 2020-12-29 NOTE — ED Notes (Signed)
Pt given cheese and juice

## 2020-12-29 NOTE — ED Provider Notes (Signed)
Behavioral Health Admission [facility](FBC & OBS)  Date: 12/29/20 Patient Name: Misty Young Mckeel MRN: [id number] Chief Complaint:  Chief Complaint  Patient presents with   Delusional    [facility], however, she never left the property. She was setting outside. [facility] presenting psychotic crying and screaming asking where are drugs. Patient was unable to be calmed down and refused to sign the waiver of medical screening exam. Patient continue to cry expressing how she wants her family and she has missed up. Patient being re-admitted to th unit via IVC.       Diagnoses:  Final diagnoses:  None    HPI: Misty Young was seen and evaluated face-to-face.  She is awake, alert and oriented x3.  Patient was able to hold a conversation.  Well spoken and articulate.  Denying suicidal or homicidal ideations.  Denied auditory or visual hallucinations.  Patient requested to discharge.  Patient discharged this morning reports taking Adderall after [facility] delusional tangential and manic. "  Except me up from the beginning, sitting on the hill" staff reported patient denied leave premises.  As she is requesting to utilize bathroom facilities to clean up her menstrual blood.  Per initial admission assessment note: Misty Young Misty Young is a 45y/o female. [facility]. [facility] with chief complaint of "I need to be somewhere safe." Patient reported that she [facility] due to fear of getting killed by her soon to be ex-husband. She is very tangential, disorganized, and appears to be in a manic state. She reports that she has a history of Bipolar and has been off her medication due to misplacing her medication at a homeless shelter. Patient is a poor historian and unable to stay on topic. She report that she is running away from her soon to be ex-husband  because she recently found out that he killed his previous wives for their disability checks. She is convinced that her husband and father-inlaw are plotting to kill her.  Patient also reports that she feels like people including law enforcement are tracking and listening to her every move. She reports that she recently found tracking devices in different objects in her home.   PHQ 2-9:   Flowsheet Row ED [facility] (Dis[facility]  C-SSRS RISK CATEGORY No Risk No Risk No Risk        Total Time spent with patient: 15 minutes  Musculoskeletal  Strength & Muscle Tone: within normal limits Gait & Station: normal Patient leans: N/A  Psychiatric Specialty Exam  Presentation General Appearance: Disheveled  Eye Contact:Good  Speech:Pressured  Speech Volume:Increased  Handedness:Right   Mood and Affect  Mood:Euthymic  Affect:Congruent   Thought Process  Thought Processes:Disorganized  Descriptions of Associations:Tangential  Orientation:Full (Time, Place and Person)  Thought Content:Tangential  Diagnosis of Schizophrenia or Schizoaffective disorder in past: No  Duration of Psychotic Symptoms: Less than six months  Hallucinations:Hallucinations: Visual Description of Visual Hallucinations: "flashes of lights"  Ideas of Reference:Delusions; Paranoia  Suicidal Thoughts:Suicidal Thoughts: No  Homicidal Thoughts:Homicidal Thoughts: No   Sensorium  Memory:Immediate Good; Recent Fair; Remote Fair  Judgment:Fair  Insight:Fair   Executive Functions  Concentration:Poor  Attention Span:Poor  Recall:Poor  Fund of Knowledge:Fair  Language:Fair   Psychomotor Activity  Psychomotor Activity:Psychomotor Activity: Normal   Assets  Assets:Desire for Improvement; Physical Health;  Financial Resources/Insurance   Sleep  Sleep:Sleep: Fair Number of Hours of Sleep: 6   Nutritional Assessment (For OBS and FBC admissions only) Has the patient had a weight loss or gain of 10 pounds or more in the last 3 months?: No Has the patient had a decrease in food intake/or appetite?: No Does the patient have dental problems?: No Does the patient have eating habits or behaviors that may be indicators of an eating disorder including binging or inducing vomiting?: No Has the patient recently lost weight without trying?: No Has the patient been eating poorly because of a decreased appetite?: No Malnutrition Screening Tool Score: 0    Physical Exam Vitals and nursing note reviewed.  Cardiovascular:     Rate and Rhythm: Normal rate and regular rhythm.     Pulses: Normal pulses.  Neurological:     Mental Status: She is alert.  Psychiatric:        Attention and Perception: She is inattentive.        Mood and Affect: Mood is anxious. Affect is labile and inappropriate.        Speech: Speech is rapid and pressured and tangential.        Behavior: Behavior is agitated, hyperactive and combative.        Thought Content: Thought content is paranoid and delusional.        Cognition and Memory: Cognition is impaired. Memory is impaired.        Judgment: Judgment is impulsive.   Review of Systems  Respiratory: Negative.    Cardiovascular: Negative.   Gastrointestinal: Negative.   Genitourinary: Negative.   Musculoskeletal: Negative.   Psychiatric/Behavioral:  Positive for substance abuse. Negative for hallucinations and suicidal ideas. The patient is nervous/anxious and has insomnia.   All other systems reviewed and are negative.  There were no vitals taken for this visit. There is no height or weight on file to calculate BMI.  Past Psychiatric History:   Is the patient at risk to self? Yes  Has the patient been a risk to self in the past 6 months? No .    Has the patient  been a risk to self within the distant past? No   Is the patient a risk to others? No   Has the patient been a risk to others in the past 6 months? No   Has the patient been a risk to others within the distant past? Yes   Past Medical History:  Past Medical History:  Diagnosis Date   Attention deficit disorder    Bipolar 1 disorder (HCC)    Hemorrhoids    uses Preparation H wipes and cream   Kidney stones    Opiate use     Past Surgical History:  Procedure Laterality Date   CESAREAN SECTION  2002/2004/2009/2013   x4   INSERTION OF MESH  06/15/2012   Procedure: INSERTION OF MESH;  Surgeon: Ernestene Mention, MD;  Location: [facility];  Service: General;  Laterality: N/A;   LITHOTRIPSY     THERAPEUTIC ABORTION     UMBILICAL HERNIA REPAIR  06/15/2012   Procedure: HERNIA REPAIR UMBILICAL ADULT;  Surgeon: Ernestene Mention, MD;  Location: [facility];  Service: General;  Laterality: N/A;    Family History:  Family History  Problem Relation Age of Onset   Diabetes Maternal Grandfather    Colon cancer Neg Hx     Social  History:  Social History   Socioeconomic History   Marital status: Married    Spouse name: Not on file   Number of children: 4   Years of education: Not on file   Highest education level: Not on file  Occupational History   Occupation: unemployed  Tobacco Use   Smoking status: Every Day    Packs/day: 0.50    Years: 15.00    Pack years: 7.50    Types: Cigarettes   Smokeless tobacco: Never  Substance and Sexual Activity   Alcohol use: Not Currently    Alcohol/week: 0.0 standard drinks    Comment: Denies ETOH (10/13/18); previously: occasionally   Drug use: Not Currently    Types: Methamphetamines    Comment: occ marijuana   Sexual activity: Not Currently    Birth control/protection: Surgical  Other Topics Concern   Not on file  Social History Narrative   Not on file   Social Determinants of Health   Financial Resource Strain: Not on file  Food Insecurity:  Not on file  Transportation Needs: Not on file  Physical Activity: Not on file  Stress: Not on file  Social Connections: Not on file  Intimate Partner Violence: Not on file    SDOH:  SDOH Screenings   Alcohol Screen: Low Risk    Last Alcohol Screening Score (AUDIT): 2  Depression (PHQ2-9): Not on file  Financial Resource Strain: Not on file  Food Insecurity: Not on file  Housing: Not on file  Physical Activity: Not on file  Social Connections: Not on file  Stress: Not on file  Tobacco Use: High Risk   Smoking Tobacco Use: Every Day   Smokeless Tobacco Use: Never  Transportation Needs: Not on file    Last Labs:  Admission on 12/29/2020, Discharged on 12/29/2020  Component Date Value Ref Range Status   SARSCOV2ONAVIRUS 2 AG 12/29/2020 NEGATIVE  NEGATIVE Final   Comment: (NOTE) SARS-CoV-2 antigen NOT DETECTED.   Negative results are presumptive.  Negative results do not preclude SARS-CoV-2 infection and should not be used as the sole basis for treatment or other patient management decisions, including infection  control decisions, particularly in the presence of clinical signs and  symptoms consistent with COVID-19, or in those who have been in contact with the virus.  Negative results must be combined with clinical observations, patient history, and epidemiological information. The expected result is Negative.  Fact Sheet for Patients: https://www.jennings-kim.com/  Fact Sheet for Healthcare Providers: https://alexander-rogers.biz/  This test is not yet approved or cleared by the [place] FDA and  has been authorized for detection and/or diagnosis of SARS-CoV-2 by FDA under an Emergency Use Authorization (EUA).  This EUA will remain in effect (meaning this test can be used) for the duration of  the COV                          ID-19 declaration under Section 564(b)(1) of the Act, 21 U.S.C. section 360bbb-3(b)(1), unless the authorization  is terminated or revoked sooner.     POC Amphetamine UR 12/29/2020 Positive (A) NONE DETECTED (Cut Off Level 1000 ng/mL) Final   POC Secobarbital (BAR) 12/29/2020 None Detected  NONE DETECTED (Cut Off Level 300 ng/mL) Final   POC Buprenorphine (BUP) 12/29/2020 None Detected  NONE DETECTED (Cut Off Level 10 ng/mL) Final   POC Oxazepam (BZO) 12/29/2020 Positive (A) NONE DETECTED (Cut Off Level 300 ng/mL) Final   POC Cocaine UR 12/29/2020 Positive (A)  NONE DETECTED (Cut Off Level 300 ng/mL) Final   POC Methamphetamine UR 12/29/2020 Positive (A) NONE DETECTED (Cut Off Level 1000 ng/mL) Final   POC Morphine 12/29/2020 None Detected  NONE DETECTED (Cut Off Level 300 ng/mL) Final   POC Oxycodone UR 12/29/2020 None Detected  NONE DETECTED (Cut Off Level 100 ng/mL) Final   POC Methadone UR 12/29/2020 None Detected  NONE DETECTED (Cut Off Level 300 ng/mL) Final   POC Marijuana UR 12/29/2020 Positive (A) NONE DETECTED (Cut Off Level 50 ng/mL) Final  Admission on 12/27/2020, Discharged on 12/27/2020  Component Date Value Ref Range Status   SARS Coronavirus 2 by RT PCR 12/27/2020 NEGATIVE  NEGATIVE Final   Comment: (NOTE) SARS-CoV-2 target nucleic acids are NOT DETECTED.  The SARS-CoV-2 RNA is generally detectable in upper respiratory specimens during the acute phase of infection. The lowest concentration of SARS-CoV-2 viral copies this assay can detect is 138 copies/mL. A negative result does not preclude SARS-Cov-2 infection and should not be used as the sole basis for treatment or other patient management decisions. A negative result may occur with  improper specimen collection/handling, submission of specimen other than nasopharyngeal swab, presence of viral mutation(s) within the areas targeted by this assay, and inadequate number of viral copies(<138 copies/mL). A negative result must be combined with clinical observations, patient history, and epidemiological information. The expected result  is Negative.  Fact Sheet for Patients:  BloggerCourse.com  Fact Sheet for Healthcare Providers:  SeriousBroker.it  This test is no                          t yet approved or cleared by the [place] FDA and  has been authorized for detection and/or diagnosis of SARS-CoV-2 by FDA under an Emergency Use Authorization (EUA). This EUA will remain  in effect (meaning this test can be used) for the duration of the COVID-19 declaration under Section 564(b)(1) of the Act, 21 U.S.C.section 360bbb-3(b)(1), unless the authorization is terminated  or revoked sooner.       Influenza A by PCR 12/27/2020 NEGATIVE  NEGATIVE Final   Influenza B by PCR 12/27/2020 NEGATIVE  NEGATIVE Final   Comment: (NOTE) The Xpert Xpress SARS-CoV-2/FLU/RSV plus assay is intended as an aid in the diagnosis of influenza from Nasopharyngeal swab specimens and should not be used as a sole basis for treatment. Nasal washings and aspirates are unacceptable for Xpert Xpress SARS-CoV-2/FLU/RSV testing.  Fact Sheet for Patients: BloggerCourse.com  Fact Sheet for Healthcare Providers: SeriousBroker.it  This test is not yet approved or cleared by the [place] FDA and has been authorized for detection and/or diagnosis of SARS-CoV-2 by FDA under an Emergency Use Authorization (EUA). This EUA will remain in effect (meaning this test can be used) for the duration of the COVID-19 declaration under Section 564(b)(1) of the Act, 21 U.S.C. section 360bbb-3(b)(1), unless the authorization is terminated or revoked.  Performed at [facility], [address], [place], [place] [address]    Sodium 12/27/2020 139  135 - 145 mmol/Young Final   Potassium 12/27/2020 3.2 (A) 3.5 - 5.1 mmol/Young Final   Chloride 12/27/2020 106  98 - 111 mmol/Young Final   CO2 12/27/2020 23  22 - 32 mmol/Young Final   Glucose, Bld  12/27/2020 81  70 - 99 mg/dL Final   Glucose reference range applies only to samples taken after fasting for at least 8 hours.   BUN 12/27/2020 22 (A) 6 -  20 mg/dL Final   Creatinine, Ser 12/27/2020 0.95  0.44 - 1.00 mg/dL Final   Calcium 25/11/3974 8.9  8.9 - 10.3 mg/dL Final   Total Protein 73/41/9379 7.0  6.5 - 8.1 g/dL Final   Albumin 02/40/9735 4.1  3.5 - 5.0 g/dL Final   AST 32/99/2426 24  15 - 41 U/Young Final   ALT 12/27/2020 39  0 - 44 U/Young Final   Alkaline Phosphatase 12/27/2020 74  38 - 126 U/Young Final   Total Bilirubin 12/27/2020 1.2  0.3 - 1.2 mg/dL Final   GFR, Estimated 12/27/2020 >60  >60 mL/min Final   Comment: (NOTE) Calculated using the CKD-EPI Creatinine Equation (2021)    Anion gap 12/27/2020 10  5 - 15 Final   Performed at [facility], [address], [place], [place] [address]   Alcohol, Ethyl (B) 12/27/2020 <10  <10 mg/dL Final   Comment: (NOTE) Lowest detectable limit for serum alcohol is 10 mg/dL.  For medical purposes only. Performed at [facility], [address] [place], [place] [address]    Opiates 12/27/2020 NONE DETECTED  NONE DETECTED Final   Cocaine 12/27/2020 POSITIVE (A) NONE DETECTED Final   Benzodiazepines 12/27/2020 NONE DETECTED  NONE DETECTED Final   Amphetamines 12/27/2020 POSITIVE (A) NONE DETECTED Final   Tetrahydrocannabinol 12/27/2020 POSITIVE (A) NONE DETECTED Final   Barbiturates 12/27/2020 NONE DETECTED  NONE DETECTED Final   Comment: (NOTE) DRUG SCREEN FOR MEDICAL PURPOSES ONLY.  IF CONFIRMATION IS NEEDED FOR ANY PURPOSE, NOTIFY LAB WITHIN 5 DAYS.  LOWEST DETECTABLE LIMITS FOR URINE DRUG SCREEN Drug Class                     Cutoff (ng/mL) Amphetamine and metabolites    1000 Barbiturate and metabolites    200 Benzodiazepine                 200 Tricyclics and metabolites     300 Opiates and metabolites        300 Cocaine and metabolites        300 THC                             50 Performed at [facility], [address], [place], [place] [address]    WBC 12/27/2020 10.7 (A) 4.0 - 10.5 K/uL Final   RBC 12/27/2020 3.80 (A) 3.87 - 5.11 MIL/uL Final   Hemoglobin 12/27/2020 11.2 (A) 12.0 - 15.0 g/dL Final   HCT 11/94/1740 34.3 (A) 36.0 - 46.0 % Final   MCV 12/27/2020 90.3  80.0 - 100.0 fL Final   MCH 12/27/2020 29.5  26.0 - 34.0 pg Final   MCHC 12/27/2020 32.7  30.0 - 36.0 g/dL Final   RDW 81/44/8185 14.8  11.5 - 15.5 % Final   Platelets 12/27/2020 258  150 - 400 K/uL Final   nRBC 12/27/2020 0.0  0.0 - 0.2 % Final   Neutrophils Relative % 12/27/2020 70  % Final   Neutro Abs 12/27/2020 7.6  1.7 - 7.7 K/uL Final   Lymphocytes Relative 12/27/2020 15  % Final   Lymphs Abs 12/27/2020 1.6  0.7 - 4.0 K/uL Final   Monocytes Relative 12/27/2020 7  % Final   Monocytes Absolute 12/27/2020 0.7  0.1 - 1.0 K/uL Final   Eosinophils Relative 12/27/2020 6  % Final   Eosinophils Absolute 12/27/2020 0.6 (A) 0.0 - 0.5 K/uL Final  Basophils Relative 12/27/2020 1  % Final   Basophils Absolute 12/27/2020 0.1  0.0 - 0.1 K/uL Final   Immature Granulocytes 12/27/2020 1  % Final   Abs Immature Granulocytes 12/27/2020 0.05  0.00 - 0.07 K/uL Final   Performed at [facility], [address], [place], [place] [address]   I-stat hCG, quantitative 12/27/2020 <5.0  <5 mIU/mL Final   Comment 3 12/27/2020          Final   Comment:   GEST. AGE      CONC.  (mIU/mL)   <=1 WEEK        5 - 50     2 WEEKS       50 - 500     3 WEEKS       100 - 10,000     4 WEEKS     1,000 - 30,000        FEMALE AND NON-PREGNANT FEMALE:     LESS THAN 5 mIU/mL   Admission on 12/14/2020, Discharged on 12/21/2020  Component Date Value Ref Range Status   TSH 12/15/2020 0.590  0.350 - 4.500 uIU/mL Final   Comment: Performed by a 3rd Generation assay with a functional sensitivity of <=0.01 uIU/mL. Performed at [facility], [address], [place], [place]  [address]    Hgb A1c MFr Bld 12/15/2020 5.6  4.8 - 5.6 % Final   Comment: (NOTE)         Prediabetes: 5.7 - 6.4         Diabetes: >6.4         Glycemic control for adults with diabetes: <7.0    Mean Plasma Glucose 12/15/2020 114  mg/dL Final   Comment: (NOTE) Performed At: [facility] [address] [place], [place] [id number] Jolene Schimke MD QP:[id number]    Cholesterol 12/15/2020 158  0 - 200 mg/dL Final   Triglycerides 99/35/7017 103  <150 mg/dL Final   HDL 79/39/0300 39 (A) >40 mg/dL Final   Total CHOL/HDL Ratio 12/15/2020 4.1  RATIO Final   VLDL 12/15/2020 21  0 - 40 mg/dL Final   LDL Cholesterol 12/15/2020 98  0 - 99 mg/dL Final   Comment:        Total Cholesterol/HDL:CHD Risk Coronary Heart Disease Risk Table                     Men   Women  1/2 Average Risk   3.4   3.3  Average Risk       5.0   4.4  2 X Average Risk   9.6   7.1  3 X Average Risk  23.4   11.0        Use the calculated Patient Ratio above and the CHD Risk Table to determine the patient's CHD Risk.        ATP III CLASSIFICATION (LDL):  <100     mg/dL   Optimal  923-300  mg/dL   Near or Above                    Optimal  130-159  mg/dL   Borderline  762-263  mg/dL   High  >335     mg/dL   Very High Performed at [facility], [address], [place], [place] [address]   Admission on 12/13/2020, Discharged on 12/14/2020  Component Date Value Ref Range Status   SARS Coronavirus 2 by RT PCR 12/13/2020  NEGATIVE  NEGATIVE Final   Comment: (NOTE) SARS-CoV-2 target nucleic acids are NOT DETECTED.  The SARS-CoV-2 RNA is generally detectable in upper respiratory specimens during the acute phase of infection. The lowest concentration of SARS-CoV-2 viral copies this assay can detect is 138 copies/mL. A negative result does not preclude SARS-Cov-2 infection and should not be used as the sole basis for treatment or other patient management decisions. A negative result may occur with   improper specimen collection/handling, submission of specimen other than nasopharyngeal swab, presence of viral mutation(s) within the areas targeted by this assay, and inadequate number of viral copies(<138 copies/mL). A negative result must be combined with clinical observations, patient history, and epidemiological information. The expected result is Negative.  Fact Sheet for Patients:  BloggerCourse.com  Fact Sheet for Healthcare Providers:  SeriousBroker.it  This test is no                          t yet approved or cleared by the [place] FDA and  has been authorized for detection and/or diagnosis of SARS-CoV-2 by FDA under an Emergency Use Authorization (EUA). This EUA will remain  in effect (meaning this test can be used) for the duration of the COVID-19 declaration under Section 564(b)(1) of the Act, 21 U.S.C.section 360bbb-3(b)(1), unless the authorization is terminated  or revoked sooner.       Influenza A by PCR 12/13/2020 NEGATIVE  NEGATIVE Final   Influenza B by PCR 12/13/2020 NEGATIVE  NEGATIVE Final   Comment: (NOTE) The Xpert Xpress SARS-CoV-2/FLU/RSV plus assay is intended as an aid in the diagnosis of influenza from Nasopharyngeal swab specimens and should not be used as a sole basis for treatment. Nasal washings and aspirates are unacceptable for Xpert Xpress SARS-CoV-2/FLU/RSV testing.  Fact Sheet for Patients: BloggerCourse.com  Fact Sheet for Healthcare Providers: SeriousBroker.it  This test is not yet approved or cleared by the [place] FDA and has been authorized for detection and/or diagnosis of SARS-CoV-2 by FDA under an Emergency Use Authorization (EUA). This EUA will remain in effect (meaning this test can be used) for the duration of the COVID-19 declaration under Section 564(b)(1) of the Act, 21 U.S.C. section 360bbb-3(b)(1), unless  the authorization is terminated or revoked.  Performed at [facility], [address] [place], [place] [address]    I-stat hCG, quantitative 12/13/2020 <5.0  <5 mIU/mL Final   Comment 3 12/13/2020          Final   Comment:   GEST. AGE      CONC.  (mIU/mL)   <=1 WEEK        5 - 50     2 WEEKS       50 - 500     3 WEEKS       100 - 10,000     4 WEEKS     1,000 - 30,000        FEMALE AND NON-PREGNANT FEMALE:     LESS THAN 5 mIU/mL    Sodium 12/13/2020 137  135 - 145 mmol/Young Final   Potassium 12/13/2020 3.6  3.5 - 5.1 mmol/Young Final   Chloride 12/13/2020 102  98 - 111 mmol/Young Final   CO2 12/13/2020 27  22 - 32 mmol/Young Final   Glucose, Bld 12/13/2020 106 (A) 70 - 99 mg/dL Final   Glucose reference range applies only to samples taken after fasting for at least 8 hours.   BUN  12/13/2020 12  6 - 20 mg/dL Final   Creatinine, Ser 12/13/2020 1.06 (A) 0.44 - 1.00 mg/dL Final   Calcium 16/04/9603 9.3  8.9 - 10.3 mg/dL Final   Total Protein 54/03/8118 6.2 (A) 6.5 - 8.1 g/dL Final   Albumin 14/78/2956 3.6  3.5 - 5.0 g/dL Final   AST 21/30/8657 18  15 - 41 U/Young Final   ALT 12/13/2020 21  0 - 44 U/Young Final   Alkaline Phosphatase 12/13/2020 62  38 - 126 U/Young Final   Total Bilirubin 12/13/2020 0.5  0.3 - 1.2 mg/dL Final   GFR, Estimated 12/13/2020 >60  >60 mL/min Final   Comment: (NOTE) Calculated using the CKD-EPI Creatinine Equation (2021)    Anion gap 12/13/2020 8  5 - 15 Final   Performed at [facility], [address], [place], [place] [address]   Alcohol, Ethyl (B) 12/13/2020 <10  <10 mg/dL Final   Comment: (NOTE) Lowest detectable limit for serum alcohol is 10 mg/dL.  For medical purposes only. Performed at [facility], [address], [place], [place] [address]    Opiates 12/14/2020 NONE DETECTED  NONE DETECTED Final   Cocaine 12/14/2020 NONE DETECTED  NONE DETECTED Final   Benzodiazepines 12/14/2020 POSITIVE (A) NONE DETECTED Final   Amphetamines 12/14/2020 POSITIVE  (A) NONE DETECTED Final   Tetrahydrocannabinol 12/14/2020 POSITIVE (A) NONE DETECTED Final   Barbiturates 12/14/2020 NONE DETECTED  NONE DETECTED Final   Comment: (NOTE) DRUG SCREEN FOR MEDICAL PURPOSES ONLY.  IF CONFIRMATION IS NEEDED FOR ANY PURPOSE, NOTIFY LAB WITHIN 5 DAYS.  LOWEST DETECTABLE LIMITS FOR URINE DRUG SCREEN Drug Class                     Cutoff (ng/mL) Amphetamine and metabolites    1000 Barbiturate and metabolites    200 Benzodiazepine                 200 Tricyclics and metabolites     300 Opiates and metabolites        300 Cocaine and metabolites        300 THC                            50 Performed at [facility], [address] [place], [place] [address]    WBC 12/13/2020 6.3  4.0 - 10.5 K/uL Final   RBC 12/13/2020 4.19  3.87 - 5.11 MIL/uL Final   Hemoglobin 12/13/2020 12.4  12.0 - 15.0 g/dL Final   HCT 40/04/2724 37.3  36.0 - 46.0 % Final   MCV 12/13/2020 89.0  80.0 - 100.0 fL Final   MCH 12/13/2020 29.6  26.0 - 34.0 pg Final   MCHC 12/13/2020 33.2  30.0 - 36.0 g/dL Final   RDW 36/64/4034 14.4  11.5 - 15.5 % Final   Platelets 12/13/2020 298  150 - 400 K/uL Final   nRBC 12/13/2020 0.0  0.0 - 0.2 % Final   Neutrophils Relative % 12/13/2020 59  % Final   Neutro Abs 12/13/2020 3.7  1.7 - 7.7 K/uL Final   Lymphocytes Relative 12/13/2020 29  % Final   Lymphs Abs 12/13/2020 1.8  0.7 - 4.0 K/uL Final   Monocytes Relative 12/13/2020 7  % Final   Monocytes Absolute 12/13/2020 0.4  0.1 - 1.0 K/uL Final   Eosinophils Relative 12/13/2020 4  % Final   Eosinophils Absolute 12/13/2020 0.2  0.0 -  0.5 K/uL Final   Basophils Relative 12/13/2020 1  % Final   Basophils Absolute 12/13/2020 0.1  0.0 - 0.1 K/uL Final   Immature Granulocytes 12/13/2020 0  % Final   Abs Immature Granulocytes 12/13/2020 0.02  0.00 - 0.07 K/uL Final   Performed at [facility], [address] [place], [place] [address]  Admission on 09/29/2020, Discharged on 09/29/2020   Component Date Value Ref Range Status   TSH 09/29/2020 0.649  0.350 - 4.500 uIU/mL Final   Comment: Performed by a 3rd Generation assay with a functional sensitivity of <=0.01 uIU/mL. Performed at [facility], [address], [place], [place] [address]    Salicylate Lvl 09/29/2020 <7.0 (A) 7.0 - 30.0 mg/dL Final   Performed at [facility], [address], [place], [place] [address]   Acetaminophen (Tylenol), Serum 09/29/2020 <10 (A) 10 - 30 ug/mL Final   Comment: (NOTE) Therapeutic concentrations vary significantly. A range of 10-30 ug/mL  may be an effective concentration for many patients. However, some  are best treated at concentrations outside of this range. Acetaminophen concentrations >150 ug/mL at 4 hours after ingestion  and >50 ug/mL at 12 hours after ingestion are often associated with  toxic reactions.  Performed at [facility], [address], [place], [place] [address]    Opiates 09/29/2020 NONE DETECTED  NONE DETECTED Final   Cocaine 09/29/2020 NONE DETECTED  NONE DETECTED Final   Benzodiazepines 09/29/2020 POSITIVE (A) NONE DETECTED Final   Amphetamines 09/29/2020 POSITIVE (A) NONE DETECTED Final   Tetrahydrocannabinol 09/29/2020 POSITIVE (A) NONE DETECTED Final   Barbiturates 09/29/2020 NONE DETECTED  NONE DETECTED Final   Comment: (NOTE) DRUG SCREEN FOR MEDICAL PURPOSES ONLY.  IF CONFIRMATION IS NEEDED FOR ANY PURPOSE, NOTIFY LAB WITHIN 5 DAYS.  LOWEST DETECTABLE LIMITS FOR URINE DRUG SCREEN Drug Class                     Cutoff (ng/mL) Amphetamine and metabolites    1000 Barbiturate and metabolites    200 Benzodiazepine                 200 Tricyclics and metabolites     300 Opiates and metabolites        300 Cocaine and metabolites        300 THC                            50 Performed at [facility], [address] [place], [place] [address]    Sodium 09/29/2020 136  135 - 145 mmol/Young Final   Potassium 09/29/2020 3.6  3.5  - 5.1 mmol/Young Final   Chloride 09/29/2020 104  98 - 111 mmol/Young Final   CO2 09/29/2020 27  22 - 32 mmol/Young Final   Glucose, Bld 09/29/2020 101 (A) 70 - 99 mg/dL Final   Glucose reference range applies only to samples taken after fasting for at least 8 hours.   BUN 09/29/2020 7  6 - 20 mg/dL Final   Creatinine, Ser 09/29/2020 0.92  0.44 - 1.00 mg/dL Final   Calcium 42/59/5638 8.8 (A) 8.9 - 10.3 mg/dL Final   Total Protein 75/64/3329 6.3 (A) 6.5 - 8.1 g/dL Final   Albumin 51/88/4166 3.5  3.5 - 5.0 g/dL Final   AST 01/04/1600 18  15 - 41 U/Young Final   ALT 09/29/2020 22  0 - 44 U/Young  Final   Alkaline Phosphatase 09/29/2020 60  38 - 126 U/Young Final   Total Bilirubin 09/29/2020 0.4  0.3 - 1.2 mg/dL Final   GFR, Estimated 09/29/2020 >60  >60 mL/min Final   Comment: (NOTE) Calculated using the CKD-EPI Creatinine Equation (2021)    Anion gap 09/29/2020 5  5 - 15 Final   Performed at [facility], [address] [place], [place] [address]   Lipase 09/29/2020 42  11 - 51 U/Young Final   Performed at [facility], [address], [place], [place] [address]   WBC 09/29/2020 7.3  4.0 - 10.5 K/uL Final   RBC 09/29/2020 4.57  3.87 - 5.11 MIL/uL Final   Hemoglobin 09/29/2020 13.2  12.0 - 15.0 g/dL Final   HCT 82/95/6213 40.8  36.0 - 46.0 % Final   MCV 09/29/2020 89.3  80.0 - 100.0 fL Final   MCH 09/29/2020 28.9  26.0 - 34.0 pg Final   MCHC 09/29/2020 32.4  30.0 - 36.0 g/dL Final   RDW 08/65/7846 15.1  11.5 - 15.5 % Final   Platelets 09/29/2020 338  150 - 400 K/uL Final   nRBC 09/29/2020 0.0  0.0 - 0.2 % Final   Neutrophils Relative % 09/29/2020 71  % Final   Neutro Abs 09/29/2020 5.2  1.7 - 7.7 K/uL Final   Lymphocytes Relative 09/29/2020 19  % Final   Lymphs Abs 09/29/2020 1.4  0.7 - 4.0 K/uL Final   Monocytes Relative 09/29/2020 6  % Final   Monocytes Absolute 09/29/2020 0.5  0.1 - 1.0 K/uL Final   Eosinophils Relative 09/29/2020 3  % Final   Eosinophils Absolute 09/29/2020 0.2  0.0 - 0.5  K/uL Final   Basophils Relative 09/29/2020 1  % Final   Basophils Absolute 09/29/2020 0.1  0.0 - 0.1 K/uL Final   Immature Granulocytes 09/29/2020 0  % Final   Abs Immature Granulocytes 09/29/2020 0.02  0.00 - 0.07 K/uL Final   Performed at [facility], [address], [place], [place] [address]   Color, Urine 09/29/2020 STRAW (A) YELLOW Final   APPearance 09/29/2020 CLEAR  CLEAR Final   Specific Gravity, Urine 09/29/2020 1.003 (A) 1.005 - 1.030 Final   pH 09/29/2020 7.0  5.0 - 8.0 Final   Glucose, UA 09/29/2020 NEGATIVE  NEGATIVE mg/dL Final   Hgb urine dipstick 09/29/2020 SMALL (A) NEGATIVE Final   Bilirubin Urine 09/29/2020 NEGATIVE  NEGATIVE Final   Ketones, ur 09/29/2020 NEGATIVE  NEGATIVE mg/dL Final   Protein, ur 28/41/3244 NEGATIVE  NEGATIVE mg/dL Final   Nitrite 07/09/7251 NEGATIVE  NEGATIVE Final   Leukocytes,Ua 09/29/2020 NEGATIVE  NEGATIVE Final   WBC, UA 09/29/2020 0-5  0 - 5 WBC/hpf Final   Bacteria, UA 09/29/2020 RARE (A) NONE SEEN Final   Squamous Epithelial / LPF 09/29/2020 6-10  0 - 5 Final   Performed at [facility], [address], [place], [place] [address]   SARS Coronavirus 2 09/29/2020 NEGATIVE  NEGATIVE Final   Comment: (NOTE) SARS-CoV-2 target nucleic acids are NOT DETECTED.  The SARS-CoV-2 RNA is generally detectable in upper and lower respiratory specimens during the acute phase of infection. Negative results do not preclude SARS-CoV-2 infection, do not rule out co-infections with other pathogens, and should not be used as the sole basis for treatment or other patient management decisions. Negative results must be combined with clinical observations, patient history, and epidemiological information. The expected result is Negative.  Fact Sheet for  Patients: HairSlick.no  Fact Sheet for Healthcare Providers: quierodirigir.com  This test is not yet approved or cleared by the [place] FDA and  has been authorized for detection and/or diagnosis of SARS-CoV-2 by FDA under an Emergency Use Authorization (EUA). This EUA will remain  in effect (meaning this test can be used) for the duration of the COVID-19 declaration under Se                          ction 564(b)(1) of the Act, 21 U.S.C. section 360bbb-3(b)(1), unless the authorization is terminated or revoked sooner.  Performed at [facility], [address] [place], [place] [address]    Preg Test, Ur 09/29/2020 NEGATIVE  NEGATIVE Final   Comment:        THE SENSITIVITY OF THIS METHODOLOGY IS >20 mIU/mL. Performed at [facility], [address], [place], [place] [address]     Allergies: Tramadol hcl and Penicillins  PTA Medications: (Not in a hospital admission)   Medical Decision Making  Overnight Observation    NP initiated IVC  agitation protocol - see chart   Recommendations  Based on my evaluation the patient appears to have an emergency medical condition for which I recommend the patient be transferred to the emergency department for further evaluation.    Oneta Rack, NP 12/29/20  1:03 PM

## 2020-12-30 DIAGNOSIS — F1994 Other psychoactive substance use, unspecified with psychoactive substance-induced mood disorder: Secondary | ICD-10-CM | POA: Diagnosis not present

## 2020-12-30 DIAGNOSIS — F3112 Bipolar disorder, current episode manic without psychotic features, moderate: Secondary | ICD-10-CM | POA: Diagnosis not present

## 2020-12-30 LAB — RESP PANEL BY RT-PCR (FLU A&B, COVID) ARPGX2
Influenza A by PCR: NEGATIVE
Influenza B by PCR: NEGATIVE
SARS Coronavirus 2 by RT PCR: NEGATIVE

## 2020-12-30 NOTE — ED Notes (Signed)
Pt asleep in bed. Respirations even and unlabored. Will continue to monitor for safety. ?

## 2020-12-30 NOTE — ED Notes (Signed)
Resting with eyes closed. Rise and fall of chest noted. No new issues reported. Will continue to monitor for safety 

## 2020-12-30 NOTE — ED Provider Notes (Signed)
FBC/OBS ASAP Discharge Summary  Date and Time: 12/30/2020 1:30 PM  Name: Misty Young  MRN:  [id number]   Discharge Diagnoses:  Final diagnoses:  Mood disorder, drug-induced (HCC)    Subjective: Misty Young denied suicidal or homicidal ideations.  Denies auditory or visual hallucinations.  Patient is requesting [facility] placement . " But I can't go back to the [facility]" CSW spoke to patient regarding additional outpatient resources.  NP and social worker attempted to make multiple phone calls regarding bed availability.  [facility] is at capacity.  Patient was not receptive to going to [facility] rescue mission at this time.  Patient provided with bus pass and additional outpatient resources. See AVS. patient was encouraged to discontinue Adderall. She declines stating that it is prescribed by her psychiatrist RepinderKuar for attention deficit disorder.  Stay Summary: NP spoke to patient who is cleared coherent and oriented this morning.  She provided verbal authorization to follow-up with a family member.  Provided verbal authorization to follow-up with her cousin Misty Young at [phone].  Her cousin is requesting long-term inpatient facility due to recent homelessness.  States she left her husband and has no contact with her children.  States she was awarded $30,000 and all of her money is gone the past 3 months.  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    NP attempted to make a safety plan and encouraged family to attended follow-up session with her psychiatrist however, Misty Young stated that "most of the family members has restraining orders "50 B" on her because she is aggressive. Reported that  Misty Young is volatile behavior when using substances.  she is unable to allow patient to reside with her currently stated that she lives at [facility].   Per initial admission assessment note: Misty Young is a 45y/o female. Patient presented to [facility]  voluntarily. Patient presented to [facility] with chief complaint of "I need to be somewhere safe." Patient reported that she walked over to [facility] due to fear of getting killed by her soon to be ex-husband. She is very tangential, disorganized, and appears to be in a manic state. She reports that she has a history of Bipolar and has been off her medication due to misplacing her medication at a homeless shelter. Patient is a poor historian and unable to stay on topic. She report that she is running away from her soon to be ex-husband because she recently found out that he killed his previous wives for their disability checks. She is convinced that her husband and father-inlaw are plotting to kill her.  Patient also reports that she feels like people including law enforcement are tracking and listening to her every move. She reports that she recently found tracking devices in different objects in her home.  Total Time spent with patient: 15 minutes  Past Psychiatric History:  Past Medical History:  Past Medical History:  Diagnosis Date   Attention deficit disorder    Bipolar 1 disorder (HCC)    Hemorrhoids    uses Preparation H wipes and cream   Kidney stones    Opiate use     Past Surgical History:  Procedure Laterality Date   CESAREAN SECTION  2002/2004/2009/2013   x4   INSERTION OF MESH  06/15/2012   Procedure: INSERTION OF MESH;  Surgeon: Ernestene Mention, MD;  Location: [facility];  Service: General;  Laterality: N/A;   LITHOTRIPSY     THERAPEUTIC ABORTION     UMBILICAL HERNIA  REPAIR  06/15/2012   Procedure: HERNIA REPAIR UMBILICAL ADULT;  Surgeon: Ernestene MentionHaywood M Ingram, MD;  Location: [facility];  Service: General;  Laterality: N/A;   Family History:  Family History  Problem Relation Age of Onset   Diabetes Maternal Grandfather    Colon cancer Neg Hx    Family Psychiatric History:  Social History:  Social History   Substance and Sexual Activity  Alcohol Use Not Currently   Alcohol/week: 0.0  standard drinks   Comment: Denies ETOH (10/13/18); previously: occasionally     Social History   Substance and Sexual Activity  Drug Use Not Currently   Types: Methamphetamines   Comment: occ marijuana    Social History   Socioeconomic History   Marital status: Married    Spouse name: Not on file   Number of children: 4   Years of education: Not on file   Highest education level: Not on file  Occupational History   Occupation: unemployed  Tobacco Use   Smoking status: Every Day    Packs/day: 0.50    Years: 15.00    Pack years: 7.50    Types: Cigarettes   Smokeless tobacco: Never  Substance and Sexual Activity   Alcohol use: Not Currently    Alcohol/week: 0.0 standard drinks    Comment: Denies ETOH (10/13/18); previously: occasionally   Drug use: Not Currently    Types: Methamphetamines    Comment: occ marijuana   Sexual activity: Not Currently    Birth control/protection: Surgical  Other Topics Concern   Not on file  Social History Narrative   Not on file   Social Determinants of Health   Financial Resource Strain: Not on file  Food Insecurity: Not on file  Transportation Needs: Not on file  Physical Activity: Not on file  Stress: Not on file  Social Connections: Not on file   SDOH:  SDOH Screenings   Alcohol Screen: Low Risk    Last Alcohol Screening Score (AUDIT): 2  Depression (PHQ2-9): Not on file  Financial Resource Strain: Not on file  Food Insecurity: Not on file  Housing: Not on file  Physical Activity: Not on file  Social Connections: Not on file  Stress: Not on file  Tobacco Use: High Risk   Smoking Tobacco Use: Every Day   Smokeless Tobacco Use: Never  Transportation Needs: Not on file    Tobacco Cessation:  A prescription for an FDA-approved tobacco cessation medication provided at discharge  Current Medications:  Current Facility-Administered Medications  Medication Dose Route Frequency Provider Last Rate Last Admin   acetaminophen  (TYLENOL) tablet 650 mg  650 mg Oral Q6H PRN Oneta RackLewis, Carley Glendenning N, NP       albuterol (VENTOLIN HFA) 108 (90 Base) MCG/ACT inhaler 2 puff  2 puff Inhalation Q6H PRN Oneta RackLewis, Gerber Penza N, NP       alum & mag hydroxide-simeth (MAALOX/MYLANTA) 200-200-20 MG/5ML suspension 30 mL  30 mL Oral Q4H PRN Oneta RackLewis, Tene Gato N, NP       gabapentin (NEURONTIN) capsule 200 mg  200 mg Oral BID Oneta RackLewis, Lajoya Dombek N, NP   200 mg at 12/30/20 [id number]   magnesium hydroxide (MILK OF MAGNESIA) suspension 30 mL  30 mL Oral Daily PRN Oneta RackLewis, Reza Crymes N, NP       OLANZapine (ZYPREXA) tablet 10 mg  10 mg Oral QHS Oneta RackLewis, Lafayette Dunlevy N, NP   10 mg at 12/29/20 2116   tranexamic acid (LYSTEDA) tablet 1,300 mg  1,300 mg Oral TID Oneta RackLewis, Lynnsey Barbara N, NP  1,300 mg at 12/30/20 0924   traZODone (DESYREL) tablet 100 mg  100 mg Oral QHS PRN Ajibola, Ene A, NP   100 mg at 12/29/20 2356   Current Outpatient Medications  Medication Sig Dispense Refill   tranexamic acid (LYSTEDA) 650 MG TABS tablet Take 650 mg by mouth 3 (three) times daily as needed (only on periods).      PTA Medications: (Not in a hospital admission)   Musculoskeletal  Strength & Muscle Tone: within normal limits Gait & Station: normal Patient leans: N/A  Psychiatric Specialty Exam  Presentation  General Appearance: Disheveled  Eye Contact:Good  Speech:Pressured  Speech Volume:Increased  Handedness:Right   Mood and Affect  Mood:Euthymic  Affect:Congruent   Thought Process  Thought Processes:Disorganized  Descriptions of Associations:Tangential  Orientation:Full (Time, Place and Person)  Thought Content:Tangential  Diagnosis of Schizophrenia or Schizoaffective disorder in past: No  Duration of Psychotic Symptoms: Less than six months   Hallucinations:Hallucinations: Visual Description of Visual Hallucinations: "flashes of lights"  Ideas of Reference:Delusions; Paranoia  Suicidal Thoughts:Suicidal Thoughts: No  Homicidal Thoughts:Homicidal Thoughts:  No   Sensorium  Memory:Immediate Good; Recent Fair; Remote Fair  Judgment:Fair  Insight:Fair   Executive Functions  Concentration:Poor  Attention Span:Poor  Recall:Poor  Fund of Knowledge:Fair  Language:Fair   Psychomotor Activity  Psychomotor Activity:Psychomotor Activity: Normal   Assets  Assets:Desire for Improvement; Physical Health; Financial Resources/Insurance   Sleep  Sleep:Sleep: Fair Number of Hours of Sleep: 6   Nutritional Assessment (For OBS and FBC admissions only) Has the patient had a weight loss or gain of 10 pounds or more in the last 3 months?: No Has the patient had a decrease in food intake/or appetite?: No Does the patient have dental problems?: No Does the patient have eating habits or behaviors that may be indicators of an eating disorder including binging or inducing vomiting?: No Has the patient recently lost weight without trying?: No Has the patient been eating poorly because of a decreased appetite?: No Malnutrition Screening Tool Score: 0    Physical Exam  Physical Exam Vitals reviewed.  Cardiovascular:     Rate and Rhythm: Normal rate and regular rhythm.  Pulmonary:     Effort: Pulmonary effort is normal.  Neurological:     Mental Status: She is alert.  Psychiatric:        Mood and Affect: Mood normal.        Thought Content: Thought content normal.   Review of Systems  Cardiovascular: Negative.   Gastrointestinal: Negative.   Musculoskeletal: Negative.   Skin: Negative.   Psychiatric/Behavioral:  Positive for depression and substance abuse. Negative for suicidal ideas.   All other systems reviewed and are negative. Blood pressure 121/87, pulse 86, temperature 97.7 F (36.5 C), temperature source Oral, resp. rate 16, SpO2 97 %. There is no height or weight on file to calculate BMI.  Demographic Factors:  Divorced or widowed, Caucasian, Low socioeconomic status, and Unemployed  Loss Factors: Loss of significant  relationship and Financial problems/change in socioeconomic status  Historical Factors: NA  Risk Reduction Factors:   NA  Continued Clinical Symptoms:  Bipolar Disorder:   Mixed State  Cognitive Features That Contribute To Risk:  Closed-mindedness    Suicide Risk:  Minimal: No identifiable suicidal ideation.  Patients presenting with no risk factors but with morbid ruminations; may be classified as minimal risk based on the severity of the depressive symptoms  Plan Of Care/Follow-up recommendations:  Activity:  as tolerated Diet:  heart healthy  Patient has additional outpatient resources list available for housing and substance abuse use   Disposition: Take all medications as prescribed. Keep all follow-up appointments as scheduled.  Do not consume alcohol or use illegal drugs while on prescription medications. Report any adverse effects from your medications to your primary care provider promptly.  In the event of recurrent symptoms or worsening symptoms, call 911, a crisis hotline, or go to the nearest emergency department for evaluation.    Oneta Rack, NP 12/30/2020, 1:30 PM

## 2020-12-30 NOTE — Discharge Instructions (Addendum)
Take all medications as prescribed. Keep all follow-up appointments as scheduled.  Do not consume alcohol or use illegal drugs while on prescription medications. Report any adverse effects from your medications to your primary care provider promptly.  In the event of recurrent symptoms or worsening symptoms, call 911, a crisis hotline, or go to the nearest emergency department for evaluation.     Shelter List:   [facility] [place]) [address] [place], [place] Phone: [phone]   [facility] has been providing emergency shelter to those in need of a permanent residence for over 35 years. The [employer] shelter plays an important role in our community.   There are many life events that can pull someone into a downward spiral towards poverty that is very difficult to get out of. Homelessness is a problem that can affect anyone of [place]. [employer] is a safe and comforting place to stay, especially if you have experienced the hardship of street life.   [facility] provides a single bed and bedding to 100 adult men and women. The shelter welcomes all who are in need of housing, no one in real need is turned away unless space is not available.   While staying at [facility], guests are offered more than just a bed for a night. Hot meals are provided and every guest has access to case management services. Case managers provide assistance with finding housing, employment, or other services that will help them gain stability. Continuous stay is based on availability, capacity, and progress towards goals.   To contact the front desk of [facility] please call   [phone] ext [phone] or ext. [phone].   [facility] [address] [place], [place] [address] Phone: [phone]   [facility] (Women only) [address] [place], [place] [address] Phone: [phone]   [facility] [address], [place] [address] Phone: [phone]   [facility]: [phone]. [address] [place], [place] [address] Phone: [phone]   [facility] [address] [place], [place] [address] Check in at 6:00PM for placement at a local shelter) Phone: [phone]     [facility]          -(Please Call) Phone: [phone]   [facility] Eligibility:  Must be drug and alcohol free for at least 14 days or more at the time of application. This program serves males.  Houses Engineer, civil (consulting), Economist who serve six-month terms.  Houses are financially self-supporting; members split house expenses, which average $90.00 to $130.00 per person per week.  Any Resident who relapses must be immediately expelled. Call:  [phone]   [facility] - Admissions are currently completed Monday through Friday at 8am; both appointments and walk-ins are accepted.  Any individual that is a [facility] resident may present for a substance abuse screening and assessment for admission.  A person may be referred by numerous sources or self-refer.   Potential clients will be screened for medical necessity and appropriateness for the program.  Clients must meet criteria for high-intensity residential treatment services.  If clinically appropriate, a client will continue with the comprehensive clinical assessment and intake process, as well as enrollment in the [facility].  Address: [address] [place], [place] [address] Admin Hours: Mon-Fri 8AM to [facility] Center Hours: 24/7 Phone: [phone] Fax: [phone]  [facility] (Detox) Facility Based Crisis:  These are 3 locations for services: Please call before arrival   Address: [address] [place], [place] [address] Phone: [phone]  Address: [address] [place], [place] [address] Phone#: [phone]  Address: [address] [place], [place] [address] Phone#: [phone]   [facility]): (offers outpatient therapy and intensive outpatient substance abuse therapy).  [address], [place], [place] [address] Phone: [phone]  [facility]: Offers FREE recovery skills classes, support groups, 1:1 Peer Support, and Compeer Classes. [address], [place], [place] [address] Phone: [phone] (Call to complete intake).   [facility] [address] [place], [place] [address] Phone: [phone] ext: [phone]  The [facility] provides food, shelter and other programs and services to the homeless men of -[place] through our [employer].  By offering safe shelter, three meals a day, clean clothing, Biblical counseling, financial planning, vocational training, GED/education and employment assistance, we've helped mend the shattered lives of many homeless men since opening in 1974.  We have approximately 267 beds available, with a max of 312 beds including mats for emergency situations and currently house an average of 270 men a night.  Prospective Client Check-In Information Photo ID Required (State/ Out of State/ [facility]) - if photo ID is not available, clients are required to have a printout of a police/sheriff's criminal history report. Help out with chores around the Mission. No sex offender of any type (pending, charged, registered and/or any other sex related offenses) will be permitted to check in. Must be willing to abide by all rules, regulations, and policies established by the [employer]. The following will be provided - shelter, food, clothing, and biblical counseling. If you or someone you know is in need of assistance at our [facility] shelter in [place], [place], please call [phone] ext. [phone].  [facility]-will provide timely access  to mental health services for children and adolescents (4-17) and adults presenting in a mental health crisis. The program is designed for those who need urgent Behavioral Health or Substance Use treatment and are not experiencing a medical crisis that would typically require an emergency room visit.    [address] [place], [place] [address] Phone: [phone] [email or web address]  [facility]: Phone#: [phone]  The [facility] SA Intensive Outpatient Program (SAIOP) means structured individual and group addiction activities and services that are provided at an outpatient program designed to assist adult and adolescent consumers to begin recovery and learn skills for recovery maintenance. The [facility] [facility] SAIOP program is offered at least 3 hours a day, 3 days a week.SAIOP services shall include a structured program consisting of, but not limited to, the following services: Individual counseling and support; Group counseling and support; Family counseling, training or support; Biochemical assays to identify recent drug use (e.g., urine drug screens); Strategies for relapse prevention to include community and social support systems in treatment; Life skills; Crisis contingency planning; Disease Management; and Treatment support activities that have been adapted or specifically designed for persons with physical disabilities, or persons with co-occurring disorders of mental illness and substance abuse/dependence or mental retardation/developmental disability and substance abuse/dependence. Phone: [phone]  Address:   The [facility] will also offer the following outpatient services: (Monday through Friday 8am-5pm)   Partial Hospitalization Program (PHP) Substance Abuse Intensive Outpatient Program (SA-IOP) Group Therapy Medication Management  Peer Living Room We also provide (24/7):  Assessments: Our mental health clinician and providers  will conduct a focused mental health evaluation, assessing for immediate safety concerns and further mental health needs. Referral: Our team will provide resources and help connect to community based mental health treatment, when indicated, including psychotherapy, psychiatry, and other specialized behavioral health or substance use disorder services (for those not already in treatment). Transitional Care: Our team providers in person bridging and/or telephonic follow-up during the patient's transition to outpatient services.  The [facility] 24-Hour Call Center: [phone] Behavioral Health Crisis Line: [phone]

## 2020-12-30 NOTE — Progress Notes (Addendum)
CSW provided the following resources to the patient, listed below. CSW spoke with the patient in reference to attempting to explore shelter resources, which the patient asked for CSW to contact [facility] to inquire about bed availability, which once contacted it was reported that currently [facility] does not have bed availability and intake for placement at the facility takes place during the week. In addition, CSW contacted [facility] shelter to inquire about bed availability, which it was reported that the staff that conducts intake will be available tomorrow morning. CSW and provider explained this information to the patient. It should be noted that the patient possibly having a friend she could stay with until she is able to find sustainable housing. The patient asked for an bus past for transportation to this friends location. A bus pass and resources listed below were provided to the patient to utilize upon her discharge :   Shelter List:   [facility] [place]) [address] [place], [place] Phone: [phone]   [facility] has been providing emergency shelter to those in need of a permanent residence for over 35 years. The [employer] shelter plays an important role in our community.   There are many life events that can pull someone into a downward spiral towards poverty that is very difficult to get out of. Homelessness is a problem that can affect anyone of [place]. [employer] is a safe and comforting place to stay, especially if you have experienced the hardship of street life.   [facility] [facility] provides a single bed and bedding to 100 adult men and women. The shelter welcomes all who are in need of housing, no one in real need is turned away unless space is not available.   While staying at [facility], guests are offered more than just a bed for a night. Hot meals are provided and every guest has  access to case management services. Case managers provide assistance with finding housing, employment, or other services that will help them gain stability. Continuous stay is based on availability, capacity, and progress towards goals.   To contact the front desk of [facility] please call   [phone] or ext. [phone].   [facility] [address] [place], [place] [address] Phone: [phone]   [facility] (Women only) [address] [place], [place] [address] Phone: [phone]   [facility] [address], [place] [address] Phone: [phone]   [facility]: [phone]. [address] [place], [place] [address] Phone: [phone]   [facility] [address], [place], [place] [address] Check in at 6:00PM for placement at a local shelter) Phone: [phone]     [facility]          -(Please Call) Phone: [phone]   [facility] Eligibility:  Must be drug and alcohol free for at least 14 days or more at the time of application. This program serves males.  Houses Engineer, civil (consulting), Economist who serve six-month terms.  Houses are financially self-supporting; members split house expenses, which average $90.00 to $130.00 per person per week.  Any Resident who relapses must be immediately expelled. Call:  [phone]    Crissie Reese, MSW, LCSW-A, LCAS-A Phone: [phone] Disposition/TOC

## 2020-12-30 NOTE — ED Notes (Addendum)
Educated on d/c instructions and f/u care. Verbalized understanding. Ambulated per self to retrieve belongings. No SI, HI, or AVH noted. No s/s pain, discomfort, or acute distress noted. A&O x4. Escorted to front lobby and d/c with bus pass. Stable at time of d/c

## 2020-12-30 NOTE — ED Notes (Addendum)
Pt on unit talking very loudly. Pt then got on phone and is crying. Cecilio Asper, NP notified and new order received to give Trazodone 100mg . Pt in agreement with this and took medication without difficulty. Pt states she "has no where to go, even the homeless people won't want me back." RN provided emotional support and encouragement. 2 cheese sticks and grape juice given. Pt resting in chair beside bed, calm with no signs of acute distress noted. Will continue to monitor for safety.

## 2021-01-04 ENCOUNTER — Ambulatory Visit: Admit: 2021-01-04 | Discharge: 2021-01-04 | Payer: PRIVATE HEALTH INSURANCE

## 2021-01-04 DIAGNOSIS — B181 Chronic viral hepatitis B without delta-agent: Secondary | ICD-10-CM

## 2021-01-04 DIAGNOSIS — K7400 Hepatic fibrosis, unspecified: Secondary | ICD-10-CM

## 2021-01-04 DIAGNOSIS — D696 Thrombocytopenia, unspecified: Secondary | ICD-10-CM

## 2021-01-04 NOTE — Patient Instructions (Addendum)
Continue Entecavir 0.5mg  daily.     We can consider repeating your fibroscan in a couple years    Please get your labs to monitor your hepatitis B every 6 months (Dec 2022 and June 2023) at [facility]    Follow-up with [place] in 1 year.    If you have any questions please send me a MyChart message or contact our clinic.   Non transplant hepatology:   [address], [place], [place] [address]   Phone [phone]   Fax [phone]

## 2021-01-04 NOTE — Progress Notes (Unsigned)
Jillian Nixon is a 45 y.o. female seen in follow up consultation for abnormal liver function tests found to have chronic hepatitis B.     I performed this consultation using real-time Telehealth tools, including a live video connection between my location and the patient's location. Prior to initiating the consultation, I obtained informed verbal consent to perform this consultation using Telehealth tools and answered all the questions about the Telehealth interaction.     History of Present Illness:  Jillian Nixon is a 45 y.o. female who is here for evaluation of abnormal liver function tests. The patient has no known significant health issues. In the fall/winter of 2018 she developed nausea and abdominal pain. This was in the setting of moving back to [place] after living in [place] for a few months. Her primary care physician ordered labs including liver function tests. Her AST/ALT were elevated to 100 and 131. Repeat liver function tests showed continued elevation in this range. She also had a mildly elevated fasting glucose of 105. Her bilirubin and alk phos were normal.  She had a total iron drawn of 197 with % saturation of 56 and ferritin of 176. Testing showed that she was positive for HBsAg with HBV DNA >2,000,000 IU/ml in May 2019 for which she was started on Entecavir.  On 02/08/18, her AST was 41 and ALT 55 with HBV DNA down to 608 IU/ml.  She had a hereditary hemochromatosis evaluation that was negative for C282Y and H63D mutations. Labs on 07/28/18 showed HBV DNA 16 IU/mL, AST 33, and ALT 37.     The patient is adopted and does not know her family history. She was born in [place] and came to the [place] when she was 52 months old.     The patient currently reports that she feels well. Her abdominal pain resolved after moving back to [facility] and with eating a regular diet. She denies jaundice, scleral icterus, ascites, swelling, nausea, vomiting, hematochezia, melena, confusion. She  drinks alcohol on occasion, but not more than 3-4 drinks in one sitting and usually on a couple nights a week. She drinks herbal tea bought in a tea shop, but denies other herbal supplements. She denies frequent tylenol use. She denies recent weight gain or loss. She denies fevers or chills.     ***     Past Medical History:   Diagnosis Date    Hyperhidrosis     Insomnia      Past Surgical History:   Procedure Laterality Date    REMOVAL BREAST IMPLANT       Current Outpatient Medications   Medication Sig Dispense Refill    entecavir (BARACLUDE) 0.5 mg tablet Take 1 tablet (0.5 mg total) by mouth daily TAKE 1 TABLET DAILY 90 tablet 3    ergocalciferol, vitamin D2, 2,000 unit TAB Take 2,000 Units by mouth Daily. Start daily treatment after finishing once a week Vit D (50,000) 60 tablet 1    glycopyrrolate (ROBINUL) 2 mg tablet Take 2 mg by mouth 2 (two) times daily.      melatonin-gaba-valerian 01-03-49 mg TAB Take by mouth.      zolpidem (AMBIEN CR) 12.5 mg ER tablet Take 12.5 mg by mouth nightly as needed for Sleep.       No current facility-administered medications for this visit.     Social History     Socioeconomic History    Marital status: Unknown/Declined     Spouse name: Not on file  Number of children: Not on file    Years of education: Not on file    Highest education level: Not on file   Occupational History    Not on file   Tobacco Use    Smoking status: Never Smoker    Smokeless tobacco: Never Used   Substance and Sexual Activity    Alcohol use: Yes     Comment: 6 drinks    Drug use: Not Currently    Sexual activity: Yes     Partners: Male     Birth control/protection: I.U.D.   Other Topics Concern    Not on file   Social History Narrative    Works in [place] History: She was adopted and does not know her family history.     REVIEW OF SYMPTOMS:***  Constitutional: No fevers  Eye problems: No scleral icterus   Ear/Nose/Throat: No sore throat   Respiratory: No SOB  Heart:  Denies chest pain  Kidney/Bladder: Denies dysuria   Neurological: Denies changes in strength or sensation   Skin: Denies jaundice  Musculoskeletal: Denies joint pain  The remainder of the review of systems were reviewed and were negative.     Physical Exam:  Weight today ***  01/06/2020: 133 pounds   Wt Readings from Last 3 Encounters:   04/09/18 59.9 kg (132 lb)   11/04/17 59.9 kg (132 lb)   09/02/17 60.8 kg (134 lb)      Constitutional: Patient appears well-developed and well-nourished. Pleasant and appropriately interactive.   Head: Normocephalic and atraumatic.   Eyes: Conjunctivae and EOM are normal.   Neck: Normal range of motion.   Pulmonary/Chest: Effort normal. No respiratory distress. No cough.  Abdomen: No abdominal distension.   Neurological: Alert and oriented to person, place, and time. Psychiatric: Normal mood and affect. Behavior is normal. Judgment and thought content normal.   Skin: No rash.     Labs: I have personally reviewed and interpreted the following laboratory studies:     12/21/2020: HBeAg positive, HbeAb positive, HBV DNA undetectable, ALT 18, AST 18    07/20/2020: HBeAg positive, HbeAb positive, HBV DNA <10, ALT 19, AST 20    12/12/19- HBeAg +, HbeAB +, HBV DNA undetectable, ALT 19, AST 23    Component Date Value    Hepatitis B e Antibody 05/06/2019 Negative     Hepatitis B e Antigen 05/06/2019 Positive     Bilirubin, Total 05/06/2019 0.9     Alkaline Phosphatase 05/06/2019 58     Aspartate transaminase 05/06/2019 27     Alanine transaminase 05/06/2019 30     HBV Real Time 05/06/2019 12*    HBV Log IU/mL 05/06/2019 1.08*      Component Date Value    Sodium, Serum / Plasma 11/26/2018 138     Int'l Normaliz Ratio 11/26/2018 1.1     Bilirubin, Total 11/26/2018 0.5     Creatinine 11/26/2018 0.81       07/28/18: Na 137, Cr 0.78, Albumin 4.5, ALT 37, AST 33, Tbili 1.3, INR 1.1, HBV 15 IU/mL     11/26/2017: AST 54, ALT 82, HBV DNA 6,010,932 IU/mL       09/25/2017 09:12 10/26/2017 08:09    Sodium 137     Potassium 3.8     Chloride 107     CO2 22     Urea Nitrogen, Serum / Plasma 9     Creatinine 0.74     Glucose, fasting 104 (H)  Anion Gap 8     eGFR if non-African American 101     eGFR if African Amer 117     Calcium 8.8     Aspartate transaminase 113 (H) 62 (H)   ALT 156 (H) 95 (H)   Alkaline Phosphatase 68 68   Bilirubin, Total 1.3     Bilirubin, Direct 0.3 (H) 0.2   GGT 71 (H) 55 (H)   Ceruloplasmin 23.4     Cholesterol, Total 163     Triglycerides, serum 53     Cholesterol, HDL 52     LDL Cholesterol 100     Chol HDL Ratio 3.1     Non HDL Cholesterol 111     IgG, serum 1,720 1,550   WBC Count   6.2   RBC Count   4.50   HEMOGLOBIN (HGB)   15.3   Hematocrit   44.3   MCV   98   MCH   34.0   MCHC   34.5   Platelet Count   117 (L)   Neutrophil Absolute Count   3.71   Lymphocyte Abs Cnt   1.89   Monocyte Abs Count   0.40   Eosinophil Abs Ct   0.14   Basophil Abs Count   0.04   Imm Gran, Left Shift   0.02   PT   14.8   Int'l Normaliz Ratio   1.2   Alpha-1-Antitrypsin, serum 127     ANA Pattern Speckled     ANA Titer 40     Titer 1 POS     Smooth Muscle Antibody 23 (H)     HBV Real Time   7,371,062 (H)   HBV Log IU/mL   6.49 (H)   HBsAg Confirmation Positive for HBs ...     Hepatitis B Surface Antibody, Quantitative <5     Hepatitis B Core Antibody, IgM POS (A)     Hepatitis B Core Antibody, Total POS (A)     Hepatitis B e Antibody   Negative   Hepatitis B e Antigen   Positive   Hepatitis B Surface Antigen Screen reactive. ... (A)     Hepatitis C Antibody NEG     HIV Ag/Ab Combo   NEG   Hepatitis D Antibody   NEGATIVE   HBV BCP Mutations   DETECTED   HBV Genotype   C   HBV Polymerase Mutations   DETECTED   HBV Precore Mutations   NOT DETECTED      She had a total iron of 197 with 56% saturation and ferritin of 176.      She had a hereditary hemochromatosis evaluation that was negative for C282Y and H63D.      Studies: I have personally reviewed and interpreted the following images-    Fibroscan  11/16/2020: 7.7 kPa, 200 CAP    US liver 12/17/18 showed VIS-A Adequate exam. Reason for limitation:  N/A. US-1 Negative.  Normal appearing liver.     Fibroscan 10-07-2017  21.2 Kpa with CAP 215     Assessment  ASSESSMENT AND PLAN:  Immune active HBeAg positive Chronic hepatitis B: She *** is positive for HBsAg and is HBeAg and HBeAb positive with previously high but now undetectable HBV DNA and normal AST and ALT after antiviral therapy. She is currently e antibody positive but remains E antigen positive. This may be a sign that her e antigen could clear in the next 1-2 years. If this were the case then we  would treat for another year and she could stop treatment. Until then, she should continue Entecavir 0.5 mg daily and repeat HBV DNA and LFTs at least every*** 6 months (ordered today).      Possible cirrhosis: She has a few spider nevi and a coarse ultrasound with low platelets and fibroscan 21.2*** (when ALT over 100).  This may be a false positive as her recent ultrasound showed a normal appearing liver.  We will repeat fibroscan when she comes back to [facility] later this year     Screening for [facility].  Guidelines recommend starting [place] screening at age 29 for her but if ongoing concern for advanced fibrosis or cirrhosis based on fibroscan, we would plan to obtain ultrasounds every ***6-12 months.     Health maintenance: she is negative for HCV , HDV and HIV. She has elevated IgG but it is coming down so likely reated to active HBV. She does not have Wilson's, Celiac, hemochromatosis (negative C282Y/H63D). She has normal fasting cholesterol panel but mildly elevated fasting glucose      Follow-up in *** months    I spent a total of ***minutes on this patient's care on the day of their visit excluding time spent related to any billed procedures. This time includes time spent with the patient as well as time spent documenting in the medical record, reviewing patient's records and tests, obtaining history, placing orders,  communicating with other healthcare professionals, counseling the patient, family, or caregiver, and/or care coordination for the diagnoses above.    I, Rich Brave am acting as a Education administrator for services provided by Cindra Eves, MD on 01/03/21  3:21 PM.    ***

## 2021-01-09 ENCOUNTER — Encounter (HOSPITAL_COMMUNITY): Payer: Self-pay | Admitting: Emergency Medicine

## 2021-01-09 ENCOUNTER — Other Ambulatory Visit: Payer: Self-pay

## 2021-01-09 ENCOUNTER — Emergency Department (HOSPITAL_COMMUNITY): Payer: Medicare Other

## 2021-01-09 ENCOUNTER — Emergency Department (HOSPITAL_COMMUNITY)
Admission: EM | Admit: 2021-01-09 | Discharge: 2021-01-09 | Disposition: A | Discharge status: Home / Self Care | Payer: Medicare Other | Attending: Emergency Medicine | Admitting: Emergency Medicine

## 2021-01-09 DIAGNOSIS — S20211A Contusion of right front wall of thorax, initial encounter: Secondary | ICD-10-CM | POA: Insufficient documentation

## 2021-01-09 DIAGNOSIS — S0990XA Unspecified injury of head, initial encounter: Secondary | ICD-10-CM | POA: Diagnosis not present

## 2021-01-09 DIAGNOSIS — F319 Bipolar disorder, unspecified: Secondary | ICD-10-CM | POA: Diagnosis not present

## 2021-01-09 DIAGNOSIS — S1093XA Contusion of unspecified part of neck, initial encounter: Secondary | ICD-10-CM | POA: Insufficient documentation

## 2021-01-09 DIAGNOSIS — S199XXA Unspecified injury of neck, initial encounter: Secondary | ICD-10-CM | POA: Diagnosis present

## 2021-01-09 DIAGNOSIS — S5002XA Contusion of left elbow, initial encounter: Secondary | ICD-10-CM | POA: Diagnosis not present

## 2021-01-09 DIAGNOSIS — R45851 Suicidal ideations: Secondary | ICD-10-CM | POA: Diagnosis not present

## 2021-01-09 DIAGNOSIS — F17210 Nicotine dependence, cigarettes, uncomplicated: Secondary | ICD-10-CM | POA: Diagnosis not present

## 2021-01-09 DIAGNOSIS — S5001XA Contusion of right elbow, initial encounter: Secondary | ICD-10-CM | POA: Diagnosis not present

## 2021-01-09 DIAGNOSIS — F1514 Other stimulant abuse with stimulant-induced mood disorder: Secondary | ICD-10-CM | POA: Insufficient documentation

## 2021-01-09 DIAGNOSIS — S8001XA Contusion of right knee, initial encounter: Secondary | ICD-10-CM | POA: Insufficient documentation

## 2021-01-09 DIAGNOSIS — T1490XA Injury, unspecified, initial encounter: Secondary | ICD-10-CM

## 2021-01-09 DIAGNOSIS — A5901 Trichomonal vulvovaginitis: Secondary | ICD-10-CM

## 2021-01-09 LAB — CBC WITH DIFFERENTIAL/PLATELET
Abs Immature Granulocytes: 0.02 10*3/uL (ref 0.00–0.07)
Basophils Absolute: 0.1 10*3/uL (ref 0.0–0.1)
Basophils Relative: 2 %
Eosinophils Absolute: 0.4 10*3/uL (ref 0.0–0.5)
Eosinophils Relative: 6 %
HCT: 37 % (ref 36.0–46.0)
Hemoglobin: 12.6 g/dL (ref 12.0–15.0)
Immature Granulocytes: 0 %
Lymphocytes Relative: 33 %
Lymphs Abs: 2.2 10*3/uL (ref 0.7–4.0)
MCH: 30.1 pg (ref 26.0–34.0)
MCHC: 34.1 g/dL (ref 30.0–36.0)
MCV: 88.3 fL (ref 80.0–100.0)
Monocytes Absolute: 0.6 10*3/uL (ref 0.1–1.0)
Monocytes Relative: 9 %
Neutro Abs: 3.3 10*3/uL (ref 1.7–7.7)
Neutrophils Relative %: 50 %
Platelets: 350 10*3/uL (ref 150–400)
RBC: 4.19 MIL/uL (ref 3.87–5.11)
RDW: 14.5 % (ref 11.5–15.5)
WBC: 6.6 10*3/uL (ref 4.0–10.5)
nRBC: 0 % (ref 0.0–0.2)

## 2021-01-09 LAB — COMPREHENSIVE METABOLIC PANEL
ALT: 27 U/L (ref 0–44)
AST: 23 U/L (ref 15–41)
Albumin: 3.9 g/dL (ref 3.5–5.0)
Alkaline Phosphatase: 69 U/L (ref 38–126)
Anion gap: 9 (ref 5–15)
BUN: 12 mg/dL (ref 6–20)
CO2: 23 mmol/L (ref 22–32)
Calcium: 9.3 mg/dL (ref 8.9–10.3)
Chloride: 108 mmol/L (ref 98–111)
Creatinine, Ser: 1.04 mg/dL — ABNORMAL HIGH (ref 0.44–1.00)
GFR, Estimated: >60 mL/min (ref >60)
Glucose, Bld: 93 mg/dL (ref 70–99)
Potassium: 3.1 mmol/L — ABNORMAL LOW (ref 3.5–5.1)
Sodium: 140 mmol/L (ref 135–145)
Total Bilirubin: 0.8 mg/dL (ref 0.3–1.2)
Total Protein: 6.6 g/dL (ref 6.5–8.1)

## 2021-01-09 LAB — RAPID URINE DRUG SCREEN, HOSP PERFORMED
Amphetamines: POSITIVE — AB
Barbiturates: NOT DETECTED
Benzodiazepines: POSITIVE — AB
Cocaine: NOT DETECTED
Opiates: NOT DETECTED
Tetrahydrocannabinol: POSITIVE — AB

## 2021-01-09 LAB — I-STAT BETA HCG BLOOD, ED (MC, WL, AP ONLY)
I-stat hCG, quantitative: <5 m[IU]/mL (ref <5)
I-stat hCG, quantitative: <5 m[IU]/mL (ref <5)

## 2021-01-09 LAB — HIV ANTIBODY (ROUTINE TESTING W REFLEX): HIV Screen 4th Generation wRfx: NONREACTIVE

## 2021-01-09 LAB — WET PREP, GENITAL
Clue Cells Wet Prep HPF POC: NONE SEEN
Sperm: NONE SEEN
Yeast Wet Prep HPF POC: NONE SEEN

## 2021-01-09 MED ORDER — IOHEXOL 350 MG/ML SOLN
75.0000 mL | Freq: Once | INTRAVENOUS | Status: AC | PRN
  Administered 2021-01-09: 75 mL via INTRAVENOUS

## 2021-01-09 MED ORDER — AZITHROMYCIN 250 MG PO TABS
1000.0000 mg | ORAL_TABLET | Freq: Every day | ORAL | Status: DC
  Administered 2021-01-09: 1000 mg via ORAL
  Filled 2021-01-09: qty 4

## 2021-01-09 MED ORDER — ELVITEG-COBIC-EMTRICIT-TENOFAF 150-150-200-10 MG PO TABS: 1.0000 | ORAL_TABLET | Freq: Every day | ORAL | 0 refills | Status: AC

## 2021-01-09 MED ORDER — POTASSIUM CHLORIDE CRYS ER 20 MEQ PO TBCR
40.0000 meq | EXTENDED_RELEASE_TABLET | Freq: Once | ORAL | Status: AC
  Administered 2021-01-09: 40 meq via ORAL
  Filled 2021-01-09: qty 2

## 2021-01-09 MED ORDER — ACYCLOVIR 400 MG PO TABS: 400.0000 mg | ORAL_TABLET | Freq: Three times a day (TID) | ORAL | 0 refills | Status: AC

## 2021-01-09 MED ORDER — LIDOCAINE HCL (PF) 1 % IJ SOLN
INTRAMUSCULAR | Status: AC
  Filled 2021-01-09: qty 5

## 2021-01-09 MED ORDER — CEFTRIAXONE SODIUM 500 MG IJ SOLR
500.0000 mg | Freq: Once | INTRAMUSCULAR | Status: AC
  Administered 2021-01-09: 500 mg via INTRAMUSCULAR
  Filled 2021-01-09: qty 500

## 2021-01-09 MED ORDER — METRONIDAZOLE 500 MG PO TABS
2000.0000 mg | ORAL_TABLET | Freq: Once | ORAL | Status: AC
  Administered 2021-01-09: 2000 mg via ORAL
  Filled 2021-01-09: qty 4

## 2021-01-09 NOTE — Progress Notes (Signed)
CSW spoke with counselor and NP regarding the need for aftercare services. CSW contacted the ED SW regarding the need for these resources.    Damita Dunnings, MSW, LCSW-A  9:13 AM 01/09/2021

## 2021-01-09 NOTE — ED Notes (Signed)
Pt gone to xray

## 2021-01-09 NOTE — ED Notes (Signed)
Transported pt to room forty for TTS.

## 2021-01-09 NOTE — Progress Notes (Signed)
No TOC consult was ordered. CSW provided patient with housing resources.

## 2021-01-09 NOTE — ED Notes (Signed)
PT's belongings placed in locker 14. PT's valuables with security
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
## 2021-01-09 NOTE — ED Notes (Signed)
PT back from X Ray
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
## 2021-01-09 NOTE — ED Notes (Signed)
Pt belongings and valuables given back to pt at this time. Pt signed for the return of items.
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
## 2021-01-10 LAB — GC/CHLAMYDIA PROBE AMP (~~LOC~~) NOT AT ARMC
Chlamydia: NEGATIVE
Comment: NEGATIVE
Comment: NORMAL
Neisseria Gonorrhea: NEGATIVE

## 2021-01-10 LAB — RPR: RPR Ser Ql: NONREACTIVE

## 2021-11-04 MED ORDER — ENTECAVIR 0.5 MG TABLET
0.5 mg | ORAL_TABLET | ORAL | 11 refills | Status: DC
Start: 2021-11-04 — End: 2022-10-15

## 2021-12-30 LAB — COMPLETE BLOOD COUNT WITH DIFFERENTIAL
% Basophils: 1 %
% Eosinophils: 2 %
% Immature Granulocytes: 0 %
% Lymphocytes: 30 %
% Monocytes: 7 %
% Neutrophils: 60 %
Abs Basophils: 0 10*3/uL (ref 0.0–0.2)
Abs Eosinophils: 0.1 10*3/uL (ref 0.0–0.4)
Abs Imm Granulocytes: 0 10*3/uL (ref 0.0–0.1)
Abs Lymphocytes: 1.6 10*3/uL (ref 0.7–3.1)
Abs Monocytes: 0.3 10*3/uL (ref 0.1–0.9)
Abs Neutrophils: 3.2 10*3/uL (ref 1.4–7.0)
Hematocrit: 45.4 % (ref 34.0–46.6)
Hemoglobin: 15 g/dL (ref 11.1–15.9)
MCH: 31.9 pg (ref 26.6–33.0)
MCHC: 33 g/dL (ref 31.5–35.7)
MCV: 97 fL (ref 79–97)
Platelet Count: 174 10*3/uL (ref 150–450)
RBC Count: 4.7 x10E6/uL (ref 3.77–5.28)
RDW-CV: 12.4 % (ref 11.7–15.4)
WBC Count: 5.3 10*3/uL (ref 3.4–10.8)

## 2021-12-30 LAB — HEPATITIS B DNA, QUANTITATIVE BY PCR: Hep B DNA (IU/mL): <10 IU/mL

## 2021-12-30 LAB — ASPARTATE TRANSAMINASE: AST: 17 IU/L (ref 0–40)

## 2021-12-30 LAB — HEPATITIS B E ANTIBODY: Hep B e Ab: POSITIVE — AB

## 2021-12-30 LAB — HEPATITIS B E ANTIGEN: Hep B e Ag: POSITIVE — AB

## 2021-12-30 LAB — ALANINE TRANSAMINASE: Alanine transaminase: 16 IU/L (ref 0–32)

## 2022-01-10 ENCOUNTER — Ambulatory Visit: Payer: PRIVATE HEALTH INSURANCE | Attending: Physician | Primary: Physician

## 2022-01-10 DIAGNOSIS — B181 Chronic viral hepatitis B without delta-agent: Secondary | ICD-10-CM

## 2022-01-10 NOTE — Progress Notes (Signed)
Jillian Nixon is a 46 y.o. female seen in follow up consultation for chronic hepatitis B. Last seen on 01/04/21.      I performed this consultation using real-time Telehealth tools, including a live video connection between my location and the patient's location. Prior to initiating the consultation, I obtained informed verbal consent to perform this consultation using Telehealth tools and answered all the questions about the Telehealth interaction.     History of Present Illness:  Jillian Nixon is a 46 y.o. female who is here for evaluation of abnormal liver function tests. The patient has no known significant health issues. In the fall/winter of 2018 she developed nausea and abdominal pain. This was in the setting of moving back to [place] after living in [place] for a few months. Her primary care physician ordered labs including liver function tests. Her AST/ALT were elevated to 100 and 131. Repeat liver function tests showed continued elevation in this range. She also had a mildly elevated fasting glucose of 105. Her bilirubin and alk phos were normal.  She had a total iron drawn of 197 with % saturation of 56 and ferritin of 176. Testing showed that she was positive for HBsAg with HBV DNA >2,000,000 IU/ml in May 2019 for which she was started on Entecavir.  On 02/08/18, her AST was 41 and ALT 55 with HBV DNA down to 608 IU/ml.  She had a hereditary hemochromatosis evaluation that was negative for C282Y and H63D mutations. Labs on 07/28/18 showed HBV DNA 16 IU/mL, AST 33, and ALT 37.     The patient is adopted and does not know her family history. She was born in [place] and came to the [place] when she was 93 months old.     The patient currently reports that she feels well. Her abdominal pain resolved after moving back to [facility] and with eating a regular diet. She denies jaundice, scleral icterus, ascites, swelling, nausea, vomiting, hematochezia, melena, confusion. She drinks alcohol on  occasion, but not more than 3-4 drinks in one sitting and usually on a couple nights a week. She drinks herbal tea bought in a tea shop, but denies other herbal supplements. She denies frequent tylenol use. She denies recent weight gain or loss. She denies fevers or chills.       Past Medical History:   Diagnosis Date    Hyperhidrosis     Insomnia      Past Surgical History:   Procedure Laterality Date    REMOVAL BREAST IMPLANT       Medication Sig    entecavir (BARACLUDE) 0.5 mg tablet Take 1 tablet (0.5 mg total) by mouth daily TAKE 1 TABLET DAILY    ergocalciferol, vitamin D2, 2,000 unit TAB Take 2,000 Units by mouth Daily. Start daily treatment after finishing once a week Vit D (50,000)    glycopyrrolate (ROBINUL) 2 mg tablet Take 2 mg by mouth 2 (two) times daily.    Melatonin  Take by mouth as needed    zolpidem (AMBIEN CR) 12.5 mg ER tablet Take 12.5 mg by mouth nightly as needed for Sleep.     Social History     Socioeconomic History    Marital status: Unknown/Declined     Spouse name: Not on file    Number of children: Not on file    Years of education: Not on file    Highest education level: Not on file   Occupational History    Not on file  Tobacco Use    Smoking status: Never Smoker    Smokeless tobacco: Never Used   Substance and Sexual Activity    Alcohol use: Yes     Comment: 6 drinks    Drug use: Not Currently    Sexual activity: Yes     Partners: Male     Birth control/protection: I.U.D.   Other Topics Concern    Not on file   Social History Narrative    Works in Building [employer]; she now lives in [place] since November 2020.     Family History: She was adopted and does not know her family history.     Review of symptoms: She reports feeling well except for ankle tendonitis and trouble sleeping.  She denies fevers, chills, night sweats, fatigue, malaise, nausea, vomiting, weight loss, numbness, focal weakness, chest pain, shortness of breath, headaches.  The remainder of the review of systems were reviewed  and were negative.    Physical Exam:  Weight today 133 pounds  Wt Readings from Last 3 Encounters:   04/09/18 59.9 kg (132 lb)   11/04/17 59.9 kg (132 lb)   09/02/17 60.8 kg (134 lb)      Constitutional: Patient appears well-developed and well-nourished. Pleasant and appropriately interactive.   Head: Normocephalic and atraumatic.   Eyes: Conjunctivae and EOM are normal.   Neck: Normal range of motion.   Pulmonary/Chest: Effort normal. No respiratory distress. No cough.  Abdomen: No abdominal distension.   Neurological: Alert and oriented to person, place, and time. Psychiatric: Normal mood and affect. Behavior is normal. Judgment and thought content normal.   Skin: No rash.     Labs: I have personally reviewed and interpreted the following laboratory studies:   12/27/2021: WBC 5.3, Hct 45.4, Plt 174, AST 17, ALT 16, Hep B eAb positive, Hep B e Ag positive, Hep B DNA <10 international units/ml, TSH 1.66, cholesterol 172, HDL 66, LDL 96, triglycerides 52, creatinine 0.83, glucose 115    06/14/2021: WBC 6.4, Hct 45.9, AST 17, ALT 11, Hep B e Ag positive, Hep B e Ab negative, HBV <10 international units/ml.     12/21/2020: HBeAg positive, HbeAb positive, HBV DNA undetectable, ALT 18, AST 18    07/20/2020: HBeAg positive, HbeAb positive, HBV DNA <10, ALT 19, AST 20    12/12/19- HBeAg+, HbeAb+, HBV DNA undetectable, ALT 19, AST 23    Component Date Value    Hepatitis B e Antibody 05/06/2019 Negative     Hepatitis B e Antigen 05/06/2019 Positive     Bilirubin, Total 05/06/2019 0.9     Alkaline Phosphatase 05/06/2019 58     Aspartate transaminase 05/06/2019 27     Alanine transaminase 05/06/2019 30     HBV Real Time 05/06/2019 12*    HBV Log IU/mL 05/06/2019 1.08*      Component Date Value    Sodium, Serum / Plasma 11/26/2018 138     Int'l Normaliz Ratio 11/26/2018 1.1     Bilirubin, Total 11/26/2018 0.5     Creatinine 11/26/2018 0.81       07/28/18: Na 137, Cr 0.78, Albumin 4.5, ALT 37, AST 33, Tbili 1.3, INR 1.1, HBV 15 IU/mL      11/26/2017: AST 54, ALT 82, HBV DNA 0,626,948 IU/mL       09/25/2017 09:12 10/26/2017 08:09   Sodium 137     Potassium 3.8     Chloride 107     CO2 22     Urea Nitrogen, Serum / Plasma  9     Creatinine 0.74     Glucose, fasting 104 (H)     Anion Gap 8     eGFR if non-African American 101     eGFR if African Amer 117     Calcium 8.8     Aspartate transaminase 113 (H) 62 (H)   ALT 156 (H) 95 (H)   Alkaline Phosphatase 68 68   Bilirubin, Total 1.3     Bilirubin, Direct 0.3 (H) 0.2   GGT 71 (H) 55 (H)   Ceruloplasmin 23.4     Cholesterol, Total 163     Triglycerides, serum 53     Cholesterol, HDL 52     LDL Cholesterol 100     Chol HDL Ratio 3.1     Non HDL Cholesterol 111     IgG, serum 1,720 1,550   WBC Count   6.2   RBC Count   4.50   HEMOGLOBIN (HGB)   15.3   Hematocrit   44.3   MCV   98   MCH   34.0   MCHC   34.5   Platelet Count   117 (L)   Neutrophil Absolute Count   3.71   Lymphocyte Abs Cnt   1.89   Monocyte Abs Count   0.40   Eosinophil Abs Ct   0.14   Basophil Abs Count   0.04   Imm Gran, Left Shift   0.02   PT   14.8   Int'l Normaliz Ratio   1.2   Alpha-1-Antitrypsin, serum 127     ANA Pattern Speckled     ANA Titer 40     Titer 1 POS     Smooth Muscle Antibody 23 (H)     HBV Real Time   6,237,628 (H)   HBV Log IU/mL   6.49 (H)   HBsAg Confirmation Positive for HBs ...     Hepatitis B Surface Antibody, Quantitative <5     Hepatitis B Core Antibody, IgM POS (A)     Hepatitis B Core Antibody, Total POS (A)     Hepatitis B e Antibody   Negative   Hepatitis B e Antigen   Positive   Hepatitis B Surface Antigen Screen reactive. ... (A)     Hepatitis C Antibody NEG     HIV Ag/Ab Combo   NEG   Hepatitis D Antibody   NEGATIVE   HBV BCP Mutations   DETECTED   HBV Genotype   C   HBV Polymerase Mutations   DETECTED   HBV Precore Mutations   NOT DETECTED      She had a total iron of 197 with 56% saturation and ferritin of 176.      She had a hereditary hemochromatosis evaluation that was negative for C282Y and H63D.       Studies: I have personally reviewed and interpreted the following images-  Fibroscan 11/16/2020: 7.7 kPa (suggests mild scarring F1-2/4), 200 CAP    US liver 12/17/18 showed normal appearing liver.     Fibroscan 10-07-2017  21.2 Kpa with CAP 215     Assessment  ASSESSMENT AND PLAN:  Chronic active HBeAg positive chronic hepatitis B: She is positive for HBsAg and is HBeAg and HBeAb positive with previously high but now undetectable HBV DNA and normal AST and ALT after antiviral therapy. She is currently eAb positive but remains eAg positive. This may be a sign that her e antigen could clear in the next 1-2 years.  If this were the case then we would treat for another year (consolidation) and she could stop treatment. For now, she should continue Entecavir 0.5 mg daily and repeat HBV DNA and LFTs at least every 6 months (ordered today).      Liver fibrosis: She has a few spider nevi and a coarse ultrasound with low platelets and fibroscan 21.2 kPa in 2019 (when ALT over 100).  This may be a false positive as her ultrasound in 2020 showed a normal appearing liver. We are reassured by recent fibroscan which shows improvement in scarring from 21 kPa down to 7.7 kPa suggesting mild (to possible moderate) fibrosis with platelets >150.      Screening for [place].  Guidelines recommend starting [place] screening at age 58 for so since updated fibroscan did not show advanced fibrosis or cirrhosis, [place] surveillance is not needed at this time (though we may periodically obtain ultrasounds every ~3 years so ordered today).     Health maintenance: she is negative for HCV , HDV and HIV. She has elevated IgG but it is coming down so likely reated to active HBV. She does not have Wilson's, Celiac, hemochromatosis (negative C282Y/H63D). She has normal fasting cholesterol panel but mildly elevated fasting glucose      Follow-up in 12 months    I, Kamryn Fouts am acting as a chart prepper for services provided by Cindra Eves, MD on  01/08/22 6:50 PM.    The above scribed documentation as annotated by me accurately reflects the services I have provided.   Cindra Eves, MD  01/10/2022 8:50 AM

## 2022-01-10 NOTE — Patient Instructions (Signed)
Continue Entecavir 0.5mg  daily.     We can consider repeating your fibroscan in a couple years    We will repeat your ultrasound whenever you are in [place] so I ordered this today (can call [phone] to schedule)    Please get your labs to monitor your hepatitis B every 6 months (around Dec 2023 and June 2024) at [facility]    Follow-up with [place] in 1 year.    If you have any questions please send me a MyChart message or contact our clinic.   Non transplant hepatology:   [address], [address], [place], [place] [address]   Phone [phone]   Fax [phone]
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
## 2022-07-28 LAB — ALANINE TRANSAMINASE: Alanine transaminase: 16 IU/L (ref 0–32)

## 2022-07-28 LAB — HEPATITIS B DNA, QUANTITATIVE BY PCR: Hep B DNA (IU/mL): NOT DETECTED IU/mL

## 2022-07-28 LAB — ASPARTATE TRANSAMINASE: AST: 19 IU/L (ref 0–40)

## 2022-07-28 LAB — HEPATITIS B E ANTIBODY: Hep B e Ab: POSITIVE — AB

## 2022-07-28 LAB — HEPATITIS B E ANTIGEN: Hep B e Ag: POSITIVE — AB

## 2022-10-15 MED ORDER — ENTECAVIR 0.5 MG TABLET
0.5 mg | ORAL_TABLET | Freq: Every day | ORAL | 11 refills | Status: DC
Start: 2022-10-15 — End: 2023-09-25

## 2022-12-13 LAB — HEMOGLOBIN A1C: Hemoglobin A1c: 5.7 % — ABNORMAL HIGH (ref 4.8–5.6)

## 2022-12-16 LAB — HEPATITIS B DNA, QUANTITATIVE BY PCR: Hep B DNA (IU/mL): NOT DETECTED IU/mL

## 2022-12-16 LAB — ASPARTATE TRANSAMINASE: AST: 15 IU/L (ref 0–40)

## 2022-12-16 LAB — HEPATITIS B E ANTIGEN: Hep B e Ag: NEGATIVE

## 2022-12-16 LAB — ALANINE TRANSAMINASE: Alanine transaminase: 11 IU/L (ref 0–32)

## 2022-12-16 LAB — HEPATITIS B E ANTIBODY: Hep B e Ab: REACTIVE — AB

## 2023-01-09 ENCOUNTER — Ambulatory Visit: Payer: PRIVATE HEALTH INSURANCE | Attending: Physician | Primary: Physician

## 2023-01-09 DIAGNOSIS — B181 Chronic viral hepatitis B without delta-agent: Secondary | ICD-10-CM

## 2023-01-09 NOTE — Patient Instructions (Incomplete)
It was a pleasure talking with you today!      -- ***Continue Entecavir 0.5mg  daily.   -- We can consider repeating your fibroscan in a couple years  -- We will *** repeat your ultrasound whenever you are in [place] so I ordered this today (can call [phone] to schedule)  -- Please get your labs to monitor your hepatitis B every 6 months *** (around Dec 2023 and June 2024) at [facility]  -- Follow-up with [place] in ***1 year.    If you have any questions please send me a MyChart message or contact our clinic.   Non transplant hepatology:   [address], [address], [place], [place] [address]   Phone [phone]   Fax [phone]

## 2023-01-09 NOTE — Progress Notes (Unsigned)
Jillian Nixon is a 47 y.o. female seen in follow up consultation for chronic hepatitis B. Last seen on 01/10/2022.     I performed this consultation using real-time Telehealth tools, including a live video connection between my location and the patient's location. Prior to initiating the consultation, I obtained informed verbal consent to perform this consultation using Telehealth tools and answered all the questions about the Telehealth interaction.     History of Present Illness:  Jillian Nixon is a 47 y.o. female who initially presented for abnormal liver function tests. The patient has no known significant health issues. In the fall/winter of 2018 she developed nausea and abdominal pain. This was in the setting of moving back to [place] after living in [place] for a few months. Her primary care physician ordered labs including liver function tests. Her AST/ALT were elevated to 100 and 131. Repeat liver function tests showed continued elevation in this range. She also had a mildly elevated fasting glucose of 105. Her bilirubin and alk phos were normal.  She had a total iron drawn of 197 with % saturation of 56 and ferritin of 176. Testing showed that she was positive for HBsAg with HBV DNA >2,000,000 IU/ml in May 2019 for which she was started on Entecavir.  On 02/08/18, her AST was 41 and ALT 55 with HBV DNA down to 608 IU/ml.  She had a hereditary hemochromatosis evaluation that was negative for C282Y and H63D mutations. Labs on 07/28/18 showed HBV DNA 16 IU/mL, AST 33, and ALT 37.     The patient is adopted and does not know her family history. She was born in [place] and came to the [place] when she was 55 months old.     The patient currently reports that she feels well. Her abdominal pain resolved after moving back to [facility] and with eating a regular diet. She denies jaundice, scleral icterus, ascites, swelling, nausea, vomiting, hematochezia, melena, confusion. She drinks alcohol on  occasion, but not more than 3-4 drinks in one sitting and usually on a couple nights a week. She drinks herbal tea bought in a tea shop, but denies other herbal supplements. She denies frequent tylenol use. She denies recent weight gain or loss. She denies fevers or chills.     ***     Past Medical History:   Diagnosis Date    Hyperhidrosis     Insomnia      Past Surgical History:   Procedure Laterality Date    REMOVAL BREAST IMPLANT       Medication Sig    entecavir (BARACLUDE) 0.5 mg tablet Take 1 tablet (0.5 mg total) by mouth daily TAKE 1 TABLET DAILY    ergocalciferol, vitamin D2, 2,000 unit TAB Take 2,000 Units by mouth Daily. Start daily treatment after finishing once a week Vit D (50,000)    glycopyrrolate (ROBINUL) 2 mg tablet Take 2 mg by mouth 2 (two) times daily.    Melatonin  Take by mouth as needed    zolpidem (AMBIEN CR) 12.5 mg ER tablet Take 12.5 mg by mouth nightly as needed for Sleep.     Social History     Socioeconomic History    Marital status: Unknown/Declined     Spouse name: Not on file    Number of children: Not on file    Years of education: Not on file    Highest education level: Not on file   Occupational History    Not on file  Tobacco Use    Smoking status: Never Smoker    Smokeless tobacco: Never Used   Substance and Sexual Activity    Alcohol use: Yes     Comment: 6 drinks    Drug use: Not Currently    Sexual activity: Yes     Partners: Male     Birth control/protection: I.U.D.   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     Family History: She was adopted and does not know her family history.     Review of symptoms: She reports *** feeling well except for ankle tendonitis and trouble sleeping.  She denies *** fevers, chills, night sweats, fatigue, malaise, nausea, vomiting, weight loss, numbness, focal weakness, chest pain, shortness of breath, headaches.  The remainder of the review of systems  were reviewed and were negative.    Physical Exam:  Weight today: ***  Weight 01/10/22: 133 pounds  Wt Readings from Last 3 Encounters:   04/09/18 59.9 kg (132 lb)   11/04/17 59.9 kg (132 lb)   09/02/17 60.8 kg (134 lb)   Constitutional: Patient appears well-developed and well-nourished. Pleasant and appropriately interactive.   Head: Normocephalic and atraumatic.   Eyes: Conjunctivae and EOM are normal.   Neck: Normal range of motion.   Pulmonary/Chest: Effort normal. No respiratory distress. No cough.  Abdomen: No abdominal distension.   Neurological: Alert and oriented to person, place, and time. Psychiatric: Normal mood and affect. Behavior is normal. Judgment and thought content normal.   Skin: No rash.      Labs: I have personally reviewed and interpreted the following laboratory studies:     12/12/2022: ***    07/25/2022: ***    12/27/2021: WBC 5.3, Hct 45.4, Plt 174, AST 17, ALT 16, Hep B eAb positive, Hep B e Ag positive, Hep B DNA <10 international units/ml, TSH 1.66, cholesterol 172, HDL 66, LDL 96, triglycerides 52, creatinine 0.83, glucose 115    06/14/2021: WBC 6.4, Hct 45.9, AST 17, ALT 11, Hep B e Ag positive, Hep B e Ab negative, HBV <10 international units/ml.     12/21/2020: HBeAg positive, HbeAb positive, HBV DNA undetectable, ALT 18, AST 18    07/20/2020: HBeAg positive, HbeAb positive, HBV DNA <10, ALT 19, AST 20    12/12/19- HBeAg+, HbeAb+, HBV DNA undetectable, ALT 19, AST 23    Component Date Value    Hepatitis B e Antibody 05/06/2019 Negative     Hepatitis B e Antigen 05/06/2019 Positive     Bilirubin, Total 05/06/2019 0.9     Alkaline Phosphatase 05/06/2019 58     Aspartate transaminase 05/06/2019 27     Alanine transaminase 05/06/2019 30     HBV Real Time 05/06/2019 12*    HBV Log IU/mL 05/06/2019 1.08*      Component Date Value    Sodium, Serum / Plasma 11/26/2018 138     Int'l Normaliz Ratio 11/26/2018 1.1     Bilirubin, Total 11/26/2018 0.5     Creatinine 11/26/2018 0.81       07/28/18: Na 137, Cr  0.78, Albumin 4.5, ALT 37, AST 33, Tbili 1.3, INR 1.1, HBV 15 IU/mL     11/26/2017: AST 54, ALT 82, HBV DNA 2,471,246 IU/mL       09/25/2017 09:12 10/26/2017 08:09   Sodium 137     Potassium 3.8     Chloride  107     CO2 22     Urea Nitrogen, Serum / Plasma 9     Creatinine 0.74     Glucose, fasting 104 (H)     Anion Gap 8     eGFR if non-African American 101     eGFR if African Amer 117     Calcium 8.8     Aspartate transaminase 113 (H) 62 (H)   ALT 156 (H) 95 (H)   Alkaline Phosphatase 68 68   Bilirubin, Total 1.3     Bilirubin, Direct 0.3 (H) 0.2   GGT 71 (H) 55 (H)   Ceruloplasmin 23.4     Cholesterol, Total 163     Triglycerides, serum 53     Cholesterol, HDL 52     LDL Cholesterol 100     Chol HDL Ratio 3.1     Non HDL Cholesterol 111     IgG, serum 1,720 1,550   WBC Count   6.2   RBC Count   4.50   HEMOGLOBIN (HGB)   15.3   Hematocrit   44.3   MCV   98   MCH   34.0   MCHC   34.5   Platelet Count   117 (L)   Neutrophil Absolute Count   3.71   Lymphocyte Abs Cnt   1.89   Monocyte Abs Count   0.40   Eosinophil Abs Ct   0.14   Basophil Abs Count   0.04   Imm Gran, Left Shift   0.02   PT   14.8   Int'l Normaliz Ratio   1.2   Alpha-1-Antitrypsin, serum 127     ANA Pattern Speckled     ANA Titer 40     Titer 1 POS     Smooth Muscle Antibody 23 (H)     HBV Real Time   0,960,454 (H)   HBV Log IU/mL   6.49 (H)   HBsAg Confirmation Positive for HBs ...     Hepatitis B Surface Antibody, Quantitative <5     Hepatitis B Core Antibody, IgM POS (A)     Hepatitis B Core Antibody, Total POS (A)     Hepatitis B e Antibody   Negative   Hepatitis B e Antigen   Positive   Hepatitis B Surface Antigen Screen reactive. ... (A)     Hepatitis C Antibody NEG     HIV Ag/Ab Combo   NEG   Hepatitis D Antibody   NEGATIVE   HBV BCP Mutations   DETECTED   HBV Genotype   C   HBV Polymerase Mutations   DETECTED   HBV Precore Mutations   NOT DETECTED      She had a total iron of 197 with 56% saturation and ferritin of 176.      She had a hereditary  hemochromatosis evaluation that was negative for C282Y and H63D.      Studies: I have personally reviewed and interpreted the following images    Abdominal Imaging:    US Abdomen 12/29/22 showed mild hepatic steatosis with course echotexture of the liver but no focal mass or nodular contour. Otherwise unremarkable abdominal ultrasound.     Fibroscan 11/16/2020: 7.7 kPa (suggests mild scarring F1-2/4), 200 CAP    US liver 12/17/18 showed normal appearing liver.     Fibroscan 10/07/2017  21.2 Kpa with CAP 215    ASSESSMENT AND PLAN:  Chronic active HBeAg positive chronic hepatitis B: She is positive for  HBsAg and is HBeAg and HBeAb positive with previously high but now undetectable HBV DNA and normal AST and ALT after antiviral therapy. She is currently eAb positive but remains eAg positive. This may be a sign that her e antigen could clear in the next 1-2 years. If this were the case then we would treat for another year (consolidation) and she could stop treatment. For now, she should continue Entecavir 0.5 mg daily and repeat HBV DNA and LFTs at least every 6 months (ordered today).      Liver fibrosis: She has a few spider nevi and a coarse ultrasound with low platelets and fibroscan 21.2 kPa in 2019 (when ALT over 100).  This may be a false positive as her ultrasound in 2020 showed a normal appearing liver. We are reassured by recent fibroscan which shows improvement in scarring from 21 kPa down to 7.7 kPa suggesting mild (to possible moderate) fibrosis with platelets >150.      Screening for HCC.  Guidelines recommend starting HCC screening at age 62 for so since updated fibroscan did not show advanced fibrosis or cirrhosis, HCC surveillance is not needed at this time (though we may periodically obtain ultrasounds every ~3 years so ordered today).     Health maintenance: she is negative for HCV , HDV and HIV. She has elevated IgG but it is coming down so likely reated to active HBV. She does not have Wilson's, Celiac,  hemochromatosis (negative C282Y/H63D). She has normal fasting cholesterol panel but mildly elevated fasting glucose      Follow-up in *** 12 months    I, Launa Grill am acting as a chart prepper for services provided by Lear Ng, MD on 01/05/23 8:00 PM.     Marland Kitchen

## 2023-09-08 LAB — COMPLETE BLOOD COUNT WITH DIFFERENTIAL
% Basophils: 0 %
% Eosinophils: 1 %
% Immature Granulocytes: 0 %
% Lymphocytes: 30 %
% Monocytes: 7 %
% Neutrophils: 62 %
Abs Basophils: 0 10*3/uL (ref 0.0–0.2)
Abs Eosinophils: 0.1 10*3/uL (ref 0.0–0.4)
Abs Imm Granulocytes: 0 10*3/uL (ref 0.0–0.1)
Abs Lymphocytes: 2.2 10*3/uL (ref 0.7–3.1)
Abs Monocytes: 0.5 10*3/uL (ref 0.1–0.9)
Abs Neutrophils: 4.7 10*3/uL (ref 1.4–7.0)
Hematocrit: 46.7 % — ABNORMAL HIGH (ref 34.0–46.6)
Hemoglobin: 15.4 g/dL (ref 11.1–15.9)
MCH: 31.7 pg (ref 26.6–33.0)
MCHC: 33 g/dL (ref 31.5–35.7)
MCV: 96 fL (ref 79–97)
Platelet Count: 184 10*3/uL (ref 150–450)
RBC Count: 4.86 x10E6/uL (ref 3.77–5.28)
RDW-CV: 12.1 % (ref 11.7–15.4)
WBC Count: 7.6 10*3/uL (ref 3.4–10.8)

## 2023-09-08 LAB — CREATININE, SERUM / PLASMA
Creatinine: 0.85 mg/dL (ref 0.57–1.00)
eGFRcr: 86 mL/min/{1.73_m2} (ref >59)

## 2023-09-08 LAB — ASPARTATE TRANSAMINASE: AST: 15 IU/L (ref 0–40)

## 2023-09-08 LAB — HEPATITIS B E ANTIBODY: Hep B e Ab: REACTIVE — AB

## 2023-09-08 LAB — HEPATITIS B DNA, QUANTITATIVE BY PCR: Hep B DNA (IU/mL): NOT DETECTED [IU]/mL

## 2023-09-08 LAB — HEPATITIS B E ANTIGEN: Hep B e Ag: NEGATIVE

## 2023-09-08 LAB — ALANINE TRANSAMINASE: Alanine transaminase: 13 IU/L (ref 0–32)

## 2023-09-25 MED ORDER — ENTECAVIR 0.5 MG TABLET
0.5 | ORAL_TABLET | Freq: Every day | ORAL | 11 refills | 30.00000 days | Status: DC
Start: 2023-09-25 — End: 2023-12-17

## 2023-12-17 MED ORDER — ENTECAVIR 0.5 MG TABLET
0.5 | ORAL_TABLET | Freq: Every day | ORAL | 3 refills | 90.00000 days | Status: DC
Start: 2023-12-17 — End: 2024-01-01

## 2023-12-29 LAB — HEPATITIS B DNA, QUANTITATIVE BY PCR: Hep B DNA (IU/mL): NOT DETECTED [IU]/mL

## 2023-12-29 LAB — ALANINE TRANSAMINASE: Alanine transaminase: 14 IU/L (ref 0–32)

## 2023-12-29 LAB — ASPARTATE TRANSAMINASE, PLASMA/SERUM: AST: 16 IU/L (ref 0–40)

## 2023-12-29 LAB — HEPATITIS B E ANTIGEN: Hep B e Ag: NEGATIVE

## 2023-12-29 LAB — HEPATITIS B E ANTIBODY: Hep B e Ab: REACTIVE — AB

## 2024-01-01 MED ORDER — ENTECAVIR 0.5 MG TABLET
0.5 | ORAL_TABLET | Freq: Every day | ORAL | 3 refills | 30.00000 days | Status: DC
Start: 2024-01-01 — End: 2024-01-05

## 2024-01-05 MED ORDER — ENTECAVIR 0.5 MG TABLET
0.5 | ORAL_TABLET | Freq: Every day | ORAL | 3 refills | 30.00000 days | Status: DC
Start: 2024-01-05 — End: 2024-01-12

## 2024-01-06 ENCOUNTER — Telehealth: Admit: 2024-01-06 | Discharge: 2024-01-06 | Payer: PRIVATE HEALTH INSURANCE | Attending: Physician | Primary: Physician

## 2024-01-06 DIAGNOSIS — B181 Chronic viral hepatitis B without delta-agent: Secondary | ICD-10-CM

## 2024-01-06 NOTE — Patient Instructions (Signed)
 It was a pleasure talking with you today!    -- Continue Entecavir  0.5mg  daily.   If your labs continue as they currently are we may consider stopping therapy next year.  -- We can consider repeating your fibroscan in a couple years or if you find an imaging center near you that will do an ultrasound elastography of your liver let me know at our next visit and I can order this for you  -- Please get your labs to monitor your hepatitis B every 6 months at [facility]  -- Follow-up with us  in 1 year.    If you have any questions please send me a MyChart message or contact our clinic.   Non transplant hepatology:   [address] [place] [address]   Phone [phone]   Fax [phone]

## 2024-01-06 NOTE — Progress Notes (Signed)
 Jillian Nixon is a 48 y.o. female seen in follow up consultation for chronic hepatitis B.  She was last seen on 01/09/23.     I performed this consultation using real-time Telehealth tools, including a live video connection between my location and the patient's location. Prior to initiating the consultation, I obtained informed verbal consent to perform this consultation using Telehealth tools and answered all the questions about the Telehealth interaction.     History of Present Illness:  Jillian Nixon is a 48 y.o. female who initially presented for abnormal liver function tests. The patient has no known significant health issues. In the fall/winter of 2018 she developed nausea and abdominal pain. This was in the setting of moving back to [place] after living in [place] for a few months. Her primary care physician ordered labs including liver function tests. Her AST/ALT were elevated to 100 and 131. Repeat liver function tests showed continued elevation in this range. She also had a mildly elevated fasting glucose of 105. Her bilirubin and alk phos were normal.  She had a total iron drawn of 197 with % saturation of 56 and ferritin of 176. Testing showed that she was positive for HBsAg with HBV DNA >2,000,000 IU/ml in May 2019 for which she was started on Entecavir .  On 02/08/18, her AST was 41 and ALT 55 with HBV DNA down to 608 IU/ml.  She had a hereditary hemochromatosis evaluation that was negative for C282Y and H63D mutations. Labs on 07/28/18 showed HBV DNA 16 IU/mL, AST 33, and ALT 37.     The patient is adopted and does not know her family history. She was born in [place] and came to the [place]  when she was 62 months old.     The patient currently reports that she feels well.  She denies jaundice, scleral icterus, ascites, swelling, nausea, vomiting, hematochezia, melena, confusion. She drinks alcohol only rarely.  She denies frequent tylenol use. She denies recent weight gain or loss. She  denies fevers or chills.      Past Medical History:   Diagnosis Date    Hyperhidrosis     Insomnia      Past Surgical History:   Procedure Laterality Date    REMOVAL BREAST IMPLANT       Medication Sig    entecavir  (BARACLUDE ) 0.5 mg tablet Take 1 tablet (0.5 mg total) by mouth daily    ergocalciferol , vitamin D2, 2,000 unit TAB Take 2,000 Units by mouth Daily.    glycopyrrolate  (ROBINUL ) 2 mg tablet Take 2 mg by mouth 2 (two) times daily.    Melatonin  Take by mouth as needed    zolpidem  (AMBIEN  CR) ER tablet Take 5 mg by mouth nightly as needed for Sleep.   Magnesium nightly    Social History     Socioeconomic History    Marital status: Unknown/Declined     Spouse name: Not on file    Number of children: Not on file    Years of education: Not on file    Highest education level: Not on file   Occupational History    Not on file   Tobacco Use    Smoking status: Never Smoker    Smokeless tobacco: Never Used   Substance and Sexual Activity    Alcohol use: Yes occasional use    Drug use: Not Currently    Sexual activity: Yes     Partners: Male     Birth control/protection: I.U.D.   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     Family History: She was adopted and does not know her family history.     Review of symptoms: She reports feeling well except for some ongoing trouble sleeping.  She denies fevers, chills, night sweats, fatigue, malaise, nausea, vomiting, weight loss, numbness, focal weakness, chest pain, shortness of breath, headaches.  The remainder of the review of systems were reviewed and were negative.    Physical Exam via video observation:  Constitutional: Patient appears well-developed and well-nourished. Pleasant and appropriately interactive.   Head: Normocephalic and atraumatic.   Eyes: Conjunctivae and EOM are normal.   Neck: Normal range of motion.   Pulmonary/Chest: Effort normal. No respiratory distress. No  cough.  Abdomen: No abdominal distension.   Neurological: Alert and oriented to person, place, and time.   Psychiatric: Normal mood and affect. Behavior is normal. Judgment and thought content normal.   Skin: No rash.      Labs: I have personally reviewed and interpreted the following laboratory studies:   12/25/23: AST 16, ALT 14, HBV DNA not detected, Hep B eAb reactive, Hep B eAg negative.     09/04/23: WBC 7.6, Hct 46.7, Plt 184, Cr 0.85, AST 15, ALT 13, HBV DNA not detected, Hep B e Ab reactive, Hep B e Ag negative.     12/12/2022: AST 15, ALT 11, Hep B e Ab reactive, Hep B e Ag negative, Hep B DNA not detected, Hgb A1c 5.7%    07/25/2022: AST 19, ALT 16, Hep B e Ab positive, Hep B e Ag positive, Hep B DNA not detected    12/27/2021: WBC 5.3, Hct 45.4, Plt 174, AST 17, ALT 16, Hep B eAb positive, Hep B e Ag positive, Hep B DNA <10 international units/ml, TSH 1.66, cholesterol 172, HDL 66, LDL 96, triglycerides 52, creatinine 0.83, glucose 115    06/14/2021: WBC 6.4, Hct 45.9, AST 17, ALT 11, Hep B e Ag positive, Hep B e Ab negative, HBV <10 international units/ml.     12/21/2020: HBeAg positive, HbeAb positive, HBV DNA undetectable, ALT 18, AST 18    07/20/2020: HBeAg positive, HbeAb positive, HBV DNA <10, ALT 19, AST 20    12/12/19- HBeAg+, HbeAb+, HBV DNA undetectable, ALT 19, AST 23    Component Date Value    Hepatitis B e Antibody 05/06/2019 Negative     Hepatitis B e Antigen 05/06/2019 Positive     Bilirubin, Total 05/06/2019 0.9     Alkaline Phosphatase 05/06/2019 58     Aspartate transaminase 05/06/2019 27     Alanine transaminase 05/06/2019 30     HBV Real Time 05/06/2019 12*    HBV Log IU/mL 05/06/2019 1.08*      Component Date Value    Sodium, Serum / Plasma 11/26/2018 138     Int'l Normaliz Ratio 11/26/2018 1.1     Bilirubin, Total 11/26/2018 0.5     Creatinine 11/26/2018 0.81       07/28/18: Na 137, Cr 0.78, Albumin 4.5, ALT 37, AST 33, Tbili 1.3, INR 1.1, HBV 15 IU/mL     11/26/2017: AST 54, ALT 82, HBV DNA  7,528,753 IU/mL       09/25/2017 09:12 10/26/2017 08:09   Sodium 137     Potassium 3.8     Chloride 107     CO2 22  Urea Nitrogen, Serum / Plasma 9     Creatinine 0.74     Glucose, fasting 104 (H)     Anion Gap 8     eGFR if non-African American 101     eGFR if African Amer 117     Calcium 8.8     Aspartate transaminase 113 (H) 62 (H)   ALT 156 (H) 95 (H)   Alkaline Phosphatase 68 68   Bilirubin, Total 1.3     Bilirubin, Direct 0.3 (H) 0.2   GGT 71 (H) 55 (H)   Ceruloplasmin 23.4     Cholesterol, Total 163     Triglycerides, serum 53     Cholesterol, HDL 52     LDL Cholesterol 100     Chol HDL Ratio 3.1     Non HDL Cholesterol 111     IgG, serum 1,720 1,550   WBC Count   6.2   RBC Count   4.50   HEMOGLOBIN (HGB)   15.3   Hematocrit   44.3   MCV   98   MCH   34.0   MCHC   34.5   Platelet Count   117 (L)   Neutrophil Absolute Count   3.71   Lymphocyte Abs Cnt   1.89   Monocyte Abs Count   0.40   Eosinophil Abs Ct   0.14   Basophil Abs Count   0.04   Imm Gran, Left Shift   0.02   PT   14.8   Int'l Normaliz Ratio   1.2   Alpha-1-Antitrypsin, serum 127     ANA Pattern Speckled     ANA Titer 40     Titer 1 POS     Smooth Muscle Antibody 23 (H)     HBV Real Time   6,950,175 (H)   HBV Log IU/mL   6.49 (H)   HBsAg Confirmation Positive for HBs ...     Hepatitis B Surface Antibody, Quantitative <5     Hepatitis B Core Antibody, IgM POS (A)     Hepatitis B Core Antibody, Total POS (A)     Hepatitis B e Antibody   Negative   Hepatitis B e Antigen   Positive   Hepatitis B Surface Antigen Screen reactive. ... (A)     Hepatitis C Antibody NEG     HIV Ag/Ab Combo   NEG   Hepatitis D Antibody   NEGATIVE   HBV BCP Mutations   DETECTED   HBV Genotype   C   HBV Polymerase Mutations   DETECTED   HBV Precore Mutations   NOT DETECTED      She had a total iron of 197 with 56% saturation and ferritin of 176.      She had a hereditary hemochromatosis evaluation that was negative for C282Y and H63D.      Studies: I have personally reviewed  and interpreted the following images  Abdominal Imaging:  US  Abdomen 12/29/22 showed mild hepatic steatosis with course echotexture of the liver but no focal mass or nodular contour. Otherwise unremarkable abdominal ultrasound.     Fibroscan 11/16/2020: 7.7 kPa (suggests mild scarring F1-2/4), CAP 200    US  liver 12/17/18 showed normal appearing liver.     Fibroscan 10/07/2017  21.2 Kpa with CAP 215    ASSESSMENT AND PLAN:  Chronic active initially HBeAg positive (now negative) chronic hepatitis B: She is positive for HBsAg and is HBeAg and HBeAb positive with previously high but now  undetectable HBV DNA and normal AST and ALT after antiviral therapy. On latest three lab checks she has had eAg seroconversion from positive to negative. We will treat for another year (consolidation) and then at next visit we will consider stopping treatment. For now, she should continue Entecavir  0.5 mg daily and repeat HBV DNA and liver enzymes every 6 months (ordered today).      Liver fibrosis: She has a few spider nevi and a coarse ultrasound with low platelets and fibroscan 21.2 kPa in 2019 (when ALT over 100).  This may be a false positive as her ultrasound in 2020 showed a normal appearing liver. We are reassured by recent fibroscan in 2022 which shows improvement in scarring from 21 kPa down to 7.7 kPa suggesting mild (to possible moderate) fibrosis with platelets >150. We will consider repeat fibroscan or US  elastography in 2026-2027.     Screening for HCC.  Guidelines recommend starting HCC screening at age 61 for so since updated fibroscan did not show advanced fibrosis or cirrhosis, HCC surveillance is not needed at this time (though we may periodically obtain ultrasounds every ~3 years).     Health maintenance: she is negative for HCV, HDV and HIV. She has normal fasting cholesterol panel but mildly elevated fasting glucose in the past.     Follow-up in 12 months    I, Dhivyadharshini Velumani am acting as a chart prepper for  services provided by Aureliano Leisa Queen, MD on 01/05/24 9:17 AM.    The above scribed documentation as annotated by me accurately reflects the services I have provided.   Aureliano Leisa Queen, MD  01/06/2024 11:01 AM

## 2024-01-13 MED ORDER — ENTECAVIR 0.5 MG TABLET
0.5 | ORAL_TABLET | Freq: Every day | ORAL | 3 refills | 30.00000 days | Status: DC
Start: 2024-01-13 — End: 2024-01-17

## 2024-01-18 MED ORDER — ENTECAVIR 0.5 MG TABLET
0.5 | ORAL_TABLET | Freq: Every day | ORAL | 3 refills | Status: DC
Start: 2024-01-18 — End: 2024-02-11

## 2024-02-11 MED ORDER — ENTECAVIR 0.5 MG TABLET
0.5 | ORAL_TABLET | Freq: Every day | ORAL | 3 refills | 90.00000 days | Status: DC
Start: 2024-02-11 — End: 2024-03-11

## 2024-03-09 NOTE — Telephone Encounter (Signed)
 Incoming fx re; Entecavir  needs PA   Please assist .thanks

## 2024-03-11 MED ORDER — ENTECAVIR 0.5 MG TABLET
0.5 | ORAL_TABLET | Freq: Every day | ORAL | 3 refills | 90.00000 days | Status: AC
Start: 2024-03-11 — End: ?

## 2024-03-11 NOTE — Addendum Note (Signed)
 Addended by: ADONA, Coraima JOY A on: 03/11/2024 09:03 AM     Modules accepted: Orders

## 2024-07-26 LAB — HEPATITIS B E ANTIGEN: Hep B e Ag: NEGATIVE

## 2024-07-26 LAB — HEPATITIS B DNA, QUANTITATIVE BY PCR: Hep B DNA (IU/mL): NOT DETECTED [IU]/mL

## 2024-07-26 LAB — HEPATITIS B VIRUS SURFACE ANTIGEN, CONFIRMATION: Hep B surf Ag Confirm: POSITIVE — AB

## 2024-07-26 LAB — ASPARTATE TRANSAMINASE, PLASMA/SERUM: AST: 13 IU/L (ref 0–40)

## 2024-07-26 LAB — ALANINE TRANSAMINASE: Alanine transaminase: 10 IU/L (ref 0–32)

## 2024-07-26 LAB — HEPATITIS B E ANTIBODY: Hep B e Ab: REACTIVE — AB

## 2024-07-26 LAB — HEPATITIS B SURFACE ANTIGEN
